# Patient Record
Sex: Female | Born: 1940 | Race: White | Hispanic: No | Marital: Single | Smoking: Former smoker
Health system: Southern US, Community
[De-identification: ages and names within clinical notes are randomized; demographics above are authoritative.]

## PROBLEM LIST (undated history)

## (undated) DIAGNOSIS — J449 Chronic obstructive pulmonary disease, unspecified: Secondary | ICD-10-CM

## (undated) DIAGNOSIS — C3490 Malignant neoplasm of unspecified part of unspecified bronchus or lung: Secondary | ICD-10-CM

## (undated) DIAGNOSIS — F32A Depression, unspecified: Secondary | ICD-10-CM

## (undated) DIAGNOSIS — E785 Hyperlipidemia, unspecified: Secondary | ICD-10-CM

## (undated) DIAGNOSIS — I639 Cerebral infarction, unspecified: Secondary | ICD-10-CM

## (undated) DIAGNOSIS — R519 Headache, unspecified: Secondary | ICD-10-CM

## (undated) DIAGNOSIS — J45909 Unspecified asthma, uncomplicated: Secondary | ICD-10-CM

## (undated) DIAGNOSIS — F419 Anxiety disorder, unspecified: Secondary | ICD-10-CM

## (undated) DIAGNOSIS — R51 Headache: Secondary | ICD-10-CM

## (undated) DIAGNOSIS — R32 Unspecified urinary incontinence: Secondary | ICD-10-CM

## (undated) DIAGNOSIS — F329 Major depressive disorder, single episode, unspecified: Secondary | ICD-10-CM

## (undated) DIAGNOSIS — E119 Type 2 diabetes mellitus without complications: Secondary | ICD-10-CM

## (undated) DIAGNOSIS — I10 Essential (primary) hypertension: Secondary | ICD-10-CM

## (undated) DIAGNOSIS — IMO0001 Reserved for inherently not codable concepts without codable children: Secondary | ICD-10-CM

## (undated) HISTORY — DX: Major depressive disorder, single episode, unspecified: F32.9

## (undated) HISTORY — DX: Hyperlipidemia, unspecified: E78.5

## (undated) HISTORY — DX: Malignant neoplasm of unspecified part of unspecified bronchus or lung: C34.90

## (undated) HISTORY — DX: Chronic obstructive pulmonary disease, unspecified: J44.9

## (undated) HISTORY — PX: COLONOSCOPY: SHX174

## (undated) HISTORY — DX: Type 2 diabetes mellitus without complications: E11.9

## (undated) HISTORY — DX: Anxiety disorder, unspecified: F41.9

## (undated) HISTORY — PX: BREAST SURGERY: SHX581

## (undated) HISTORY — DX: Essential (primary) hypertension: I10

## (undated) HISTORY — DX: Unspecified urinary incontinence: R32

## (undated) HISTORY — DX: Cerebral infarction, unspecified: I63.9

## (undated) HISTORY — DX: Hypomagnesemia: E83.42

## (undated) HISTORY — PX: CARDIAC CATHETERIZATION: SHX172

## (undated) HISTORY — DX: Depression, unspecified: F32.A

---

## 2004-07-10 ENCOUNTER — Emergency Department: Payer: Self-pay | Admitting: Unknown Physician Specialty

## 2006-12-24 ENCOUNTER — Other Ambulatory Visit: Payer: Self-pay

## 2006-12-24 ENCOUNTER — Inpatient Hospital Stay: Payer: Self-pay | Admitting: Internal Medicine

## 2008-03-03 ENCOUNTER — Ambulatory Visit: Payer: Self-pay | Admitting: Gastroenterology

## 2008-03-25 ENCOUNTER — Emergency Department: Payer: Self-pay | Admitting: Emergency Medicine

## 2009-04-12 ENCOUNTER — Ambulatory Visit: Payer: Self-pay | Admitting: Cardiovascular Disease

## 2012-12-23 ENCOUNTER — Ambulatory Visit: Payer: Self-pay | Admitting: Family Medicine

## 2012-12-31 ENCOUNTER — Ambulatory Visit: Payer: Self-pay | Admitting: Family Medicine

## 2013-11-04 ENCOUNTER — Ambulatory Visit: Payer: Self-pay | Admitting: Family Medicine

## 2013-12-30 ENCOUNTER — Ambulatory Visit: Payer: Self-pay | Admitting: Family Medicine

## 2013-12-30 LAB — RAPID STREP-A WITH REFLX: MICRO TEXT REPORT: NEGATIVE

## 2014-01-01 LAB — BETA STREP CULTURE(ARMC)

## 2014-02-26 ENCOUNTER — Institutional Professional Consult (permissible substitution): Payer: Self-pay | Admitting: Internal Medicine

## 2014-03-02 ENCOUNTER — Institutional Professional Consult (permissible substitution): Payer: Self-pay | Admitting: Internal Medicine

## 2014-03-30 ENCOUNTER — Ambulatory Visit: Payer: Self-pay | Admitting: Gastroenterology

## 2014-04-26 LAB — SURGICAL PATHOLOGY

## 2014-06-15 ENCOUNTER — Encounter: Payer: Self-pay | Admitting: Family Medicine

## 2014-06-15 ENCOUNTER — Ambulatory Visit (INDEPENDENT_AMBULATORY_CARE_PROVIDER_SITE_OTHER): Payer: PPO | Admitting: Family Medicine

## 2014-06-15 VITALS — BP 101/66 | HR 94 | Temp 98.3°F | Wt 274.0 lb

## 2014-06-15 DIAGNOSIS — I10 Essential (primary) hypertension: Secondary | ICD-10-CM

## 2014-06-15 DIAGNOSIS — E785 Hyperlipidemia, unspecified: Secondary | ICD-10-CM

## 2014-06-15 DIAGNOSIS — R0901 Asphyxia: Secondary | ICD-10-CM

## 2014-06-15 DIAGNOSIS — IMO0002 Reserved for concepts with insufficient information to code with codable children: Secondary | ICD-10-CM | POA: Insufficient documentation

## 2014-06-15 DIAGNOSIS — J441 Chronic obstructive pulmonary disease with (acute) exacerbation: Secondary | ICD-10-CM

## 2014-06-15 DIAGNOSIS — R0982 Postnasal drip: Secondary | ICD-10-CM

## 2014-06-15 DIAGNOSIS — J984 Other disorders of lung: Secondary | ICD-10-CM

## 2014-06-15 DIAGNOSIS — K760 Fatty (change of) liver, not elsewhere classified: Secondary | ICD-10-CM | POA: Insufficient documentation

## 2014-06-15 DIAGNOSIS — R911 Solitary pulmonary nodule: Secondary | ICD-10-CM | POA: Insufficient documentation

## 2014-06-15 DIAGNOSIS — E1165 Type 2 diabetes mellitus with hyperglycemia: Secondary | ICD-10-CM

## 2014-06-15 DIAGNOSIS — J301 Allergic rhinitis due to pollen: Secondary | ICD-10-CM

## 2014-06-15 MED ORDER — PREDNISONE 20 MG PO TABS: ORAL_TABLET | ORAL | Status: DC

## 2014-06-15 MED ORDER — PRAVASTATIN SODIUM 80 MG PO TABS: 80.0000 mg | ORAL_TABLET | Freq: Once | ORAL | Status: DC

## 2014-06-15 MED ORDER — FLUTICASONE PROPIONATE 50 MCG/ACT NA SUSP: 2.0000 | Freq: Every day | NASAL | Status: DC

## 2014-06-15 MED ORDER — METFORMIN HCL ER 500 MG PO TB24: 1000.0000 mg | ORAL_TABLET | Freq: Two times a day (BID) | ORAL | Status: DC

## 2014-06-15 MED ORDER — GLIMEPIRIDE 4 MG PO TABS: 4.0000 mg | ORAL_TABLET | Freq: Every day | ORAL | Status: DC

## 2014-06-15 MED ORDER — DOXYCYCLINE HYCLATE 100 MG PO TABS: 100.0000 mg | ORAL_TABLET | Freq: Two times a day (BID) | ORAL | Status: DC

## 2014-06-15 MED ORDER — BENAZEPRIL HCL 40 MG PO TABS: 40.0000 mg | ORAL_TABLET | Freq: Every day | ORAL | Status: DC

## 2014-06-15 NOTE — Assessment & Plan Note (Signed)
Continue meds, weight loss, dash guidelines

## 2014-06-15 NOTE — Progress Notes (Signed)
Patient ID: Sherry Mueller, female   DOB: 05-18-1940, 74 y.o.   MRN: [id number]   Subjective:   Sherry Mueller is a 74 y.o. female here for follow-up today  1.  COPD; started wheezing a week ago; no fevers; she sweats sometimes, but not sure if fever; short of breath; wears oxygen at home, just at night; coughing up phlegm, just white; energy is sapped; she has never been to the lung doctor; she is not interested in going to see a lung doctor at all  2.  Diabetes mellitus type 2 Patient has had diabetes since 2006 Checking sugars?  yes How often? Few times a week Range (low to high) over last two weeks:  135-150 Feels diabetes is under very good control, as good as can be Does patient feel additional teaching/training would be helpful?  no Trying to limit white bread, white rice, white potatoes, sweets?  yes   3.  Hypertension Patient has had hypertension since 2006 or so Checking blood pressure away from here?  no Hypertension-associated complications:  none If taking medicines, are you taking them regularly?  yes Siblings / family history: Does high blood pressure run in your family?   no Salt:  Trying to limit sodium / salt when buying foods at the grocery store?  yes  Do you try to limit added salt when cooking and at the table?  NO, does love her salt and adds it at the table Sweets/licorice:  Do you eat a lot of sweets or eat black licorice?  no Saturated fats: Do you eat a lot of foods like bacon, sausage, pepperoni, cheeseburgers, hot dogs, bologna, and cheese?  no Sedentary lifestyle:  Exercise/activity level:  sedentary (essential NO activity) Steroids/Non-steroidals:  Have you had a recent cortisone shot in the last few months?  no  Do you take prednisone or prescription NSAIDs NO, or take OTCS NSAIDs such as ibuprofen, Motrin, Advil, Aleve, or naproxen? no Smoking: Do you smoke?  no Sudafed (decongestants): Do you use any OTC decongestant products like Allegra-D,  Claritin-D, Zyrtec-D, Tylenol Cold and Sinus, etc.?  YES  4.  Obesity; she is working on weight loss, cutting down what she eats; she has lost 6 pounds since last visit; not worrisome, all intentional  5.  High cholesterol; she is taking statin without any problems; she is working on weight loss as noted   Past Medical History  Diagnosis Date  . Anxiety   . Depression   . COPD (chronic obstructive pulmonary disease)   . Stroke   . Hyperlipidemia   . Hypertension   . Diabetes mellitus without complication   . CHF (congestive heart failure)    Past Surgical History  Procedure Laterality Date  . Breast surgery Left cyst removed  . Cardiac catheterization     Family History  Problem Relation Age of Onset  . Heart disease Mother   . Hypertension Mother   . Heart disease Father   . Stroke Father    History  Substance Use Topics  . Smoking status: Former Smoker    Quit date: 06/15/2007  . Smokeless tobacco: Never Used  . Alcohol Use: No     Allergies  Allergen Reactions  . Byetta 10 Mcg Pen [Exenatide] Nausea Only  . Hctz [Hydrochlorothiazide] Nausea Only  . Penicillins Itching  . Plavix [Clopidogrel] Nausea Only   ------------- Review of Systems  Constitutional: Positive for fever (sweating, not sure if fever) and fatigue. Negative for unexpected weight change.  HENT: Positive for postnasal drip and rhinorrhea.   Respiratory: Positive for cough, shortness of breath and wheezing.   Gastrointestinal: Negative for abdominal pain and blood in stool.  Endocrine: Positive for polydipsia and polyuria.  Allergic/Immunologic: Positive for environmental allergies.  Neurological: Positive for tremors. Negative for speech difficulty.  Hematological: Does not bruise/bleed easily.  Psychiatric/Behavioral: Negative for confusion and decreased concentration.    Objective:   Filed Vitals:   06/15/14 0810  BP: 101/66  Pulse: 94  Temp: 98.3 F (36.8 C)  Weight: 274 lb  (124.286 kg)  SpO2: 89%   Body mass index is 42.9 kg/(m^2). Wt Readings from Last 3 Encounters:  06/15/14 274 lb (124.286 kg)  03/15/14 280 lb (127.007 kg)   '@PHYSEXAMBYAGE'$ @ Physical Exam  Constitutional: She appears well-developed and well-nourished. No distress.  Obese, weight loss noted  HENT:  Head: Normocephalic and atraumatic.  Neck: Normal range of motion. No thyromegaly present.  Cardiovascular: Normal rate, regular rhythm and normal heart sounds.   No murmur heard. Respiratory: Effort normal. No respiratory distress. She has wheezes.  Frequent cough; decreased breath sounds left upper lung field; no focal rhonchi; diffuse mild expiratory wheezing  GI: Soft. Bowel sounds are normal. She exhibits no distension. There is no tenderness.  Musculoskeletal: She exhibits no edema.  Lymphadenopathy:    She has no cervical adenopathy.  Neurological: She is alert. She displays tremor. She displays no atrophy. No sensory deficit. She exhibits normal muscle tone.  Skin: Skin is warm and dry. No rash noted. No pallor.  Psychiatric: She has a normal mood and affect. Her behavior is normal. Judgment and thought content normal.   Diabetic Foot Form - Detailed   Diabetic Foot Exam - detailed  Diabetic Foot exam was performed with the following findings:  Yes 06/15/2014 11:48 AM  Visual Foot Exam completed.:  Yes  Are the toenails ingrown?:  No  Normal Range of Motion:  No    Pulse Foot Exam completed.:  Yes  Right Dorsalis Pedis:  Present Left Dorsalis Pedis:  Present  Sensory Foot Exam Completed.:  Yes  Semmes-Weinstein Monofilament Test  R Site 1-Great Toe:  Pos L Site 1-Great Toe:  Pos       Assessment/Plan:   Problem List Items Addressed This Visit      Cardiovascular and Mediastinum   Benign hypertension    Continue meds, weight loss, dash guidelines      Relevant Medications   amLODipine (NORVASC) 5 MG tablet   isosorbide mononitrate (IMDUR) 30 MG 24 hr tablet    benazepril (LOTENSIN) 40 MG tablet   pravastatin (PRAVACHOL) 80 MG tablet     Respiratory   Restrictive lung disease    She does not want to see pulmonologist; encouraged her to continue weight loss      Allergic rhinitis due to pollen    Start nasal corticosteroid        Digestive   Fatty liver    Check LFTs, praised patient for purposeful weight loss      Relevant Orders   Comprehensive metabolic panel     Other   Pulmonary nodule    Chest CT ordered; discussed with patient      Relevant Orders   CT Chest Wo Contrast   Diabetes mellitus type 2, uncontrolled    Last A1C was 7.8 in February; ordered today; last urine microalbumin in October 2015 was normal, next due October 29, 2014 or just after; continue modest weight loss, healthy eating; no  need to check FSBS reguarly per [employer] Best Practices; foot exam by MD today      Relevant Medications   metFORMIN (GLUCOPHAGE-XR) 500 MG 24 hr tablet   benazepril (LOTENSIN) 40 MG tablet   glimepiride (AMARYL) 4 MG tablet   pravastatin (PRAVACHOL) 80 MG tablet   Other Relevant Orders   Hgb A1c w/o eAG   Lipid Panel w/o Chol/HDL Ratio   Hyperlipidemia    On statin, continue, check lipids and sgpt today      Relevant Medications   amLODipine (NORVASC) 5 MG tablet   isosorbide mononitrate (IMDUR) 30 MG 24 hr tablet   benazepril (LOTENSIN) 40 MG tablet   pravastatin (PRAVACHOL) 80 MG tablet   Other Relevant Orders   Lipid Panel w/o Chol/HDL Ratio   Morbid obesity    Encouragement given to continue purposeful weight loss      Relevant Medications   metFORMIN (GLUCOPHAGE-XR) 500 MG 24 hr tablet   glimepiride (AMARYL) 4 MG tablet   Mild asphyxia    She does not want to see pulmonologist; continue nocturnal oxygen      Relevant Orders   CBC with Differential/Platelet    Other Visit Diagnoses    COPD with acute exacerbation    -  Primary    pt declined CXR today; declined sooner f/u appt; start steroids and antibiotics;  she says she'll call if she needs me    Relevant Medications    Ipratropium-Albuterol (COMBIVENT RESPIMAT) 20-100 MCG/ACT AERS respimat    Fluticasone-Salmeterol (ADVAIR DISKUS) 500-50 MCG/DOSE AEPB    predniSONE (DELTASONE) 20 MG tablet    fluticasone (FLONASE) 50 MCG/ACT nasal spray    Other Relevant Orders    CBC with Differential/Platelet    Postnasal drip        start nasal corticosteroid        Meds ordered this encounter  Medications  . amLODipine (NORVASC) 5 MG tablet    Sig: Take 5 mg by mouth daily.  . isosorbide mononitrate (IMDUR) 30 MG 24 hr tablet    Sig: Take 30 mg by mouth daily.  . Ipratropium-Albuterol (COMBIVENT RESPIMAT) 20-100 MCG/ACT AERS respimat    Sig: Inhale 1 puff into the lungs every 6 (six) hours.  . busPIRone (BUSPAR) 10 MG tablet    Sig: Take 10 mg by mouth 2 (two) times daily as needed.  Marland Kitchen DISCONTD: metFORMIN (GLUCOPHAGE-XR) 500 MG 24 hr tablet    Sig: Take 500 mg by mouth. Take 2 tablets twice a day  . Fluticasone-Salmeterol (ADVAIR DISKUS) 500-50 MCG/DOSE AEPB    Sig: Inhale 1 puff into the lungs 2 (two) times daily.  Marland Kitchen DISCONTD: benazepril (LOTENSIN) 40 MG tablet    Sig: Take 40 mg by mouth daily.  . citalopram (CELEXA) 20 MG tablet    Sig: Take 20 mg by mouth daily.  . predniSONE (DELTASONE) 20 MG tablet    Sig: Take 3 pills daily x 2 days, then 2 pills daily x 2 days, then 1 pill daily x 2 days; take with food    Dispense:  12 tablet    Refill:  0  . doxycycline (VIBRA-TABS) 100 MG tablet    Sig: Take 1 tablet (100 mg total) by mouth 2 (two) times daily.    Dispense:  14 tablet    Refill:  0  . fluticasone (FLONASE) 50 MCG/ACT nasal spray    Sig: Place 2 sprays into both nostrils daily.    Dispense:  15.8 g  Refill:  11  . metFORMIN (GLUCOPHAGE-XR) 500 MG 24 hr tablet    Sig: Take 2 tablets (1,000 mg total) by mouth 2 (two) times daily. Take 2 tablets twice a day    Dispense:  360 tablet    Refill:  1  . benazepril (LOTENSIN) 40 MG  tablet    Sig: Take 1 tablet (40 mg total) by mouth daily.    Dispense:  90 tablet    Refill:  1  . glimepiride (AMARYL) 4 MG tablet    Sig: Take 1 tablet (4 mg total) by mouth daily with breakfast.    Dispense:  90 tablet    Refill:  1  . pravastatin (PRAVACHOL) 80 MG tablet    Sig: Take 1 tablet (80 mg total) by mouth once.    Dispense:  90 tablet    Refill:  1   Orders Placed This Encounter  Procedures  . CT Chest Wo Contrast    Order Specific Question:  Reason for Exam (SYMPTOM  OR DIAGNOSIS REQUIRED)    Answer:  lung nodule, 4 mm, follow-up    Order Specific Question:  Preferred imaging location?    Answer:  [facility]  . Hgb A1c w/o eAG  . CBC with Differential/Platelet  . Comprehensive metabolic panel    Order Specific Question:  Has the patient fasted?    Answer:  Yes  . Lipid Panel w/o Chol/HDL Ratio    Order Specific Question:  Has the patient fasted?    Answer:  Yes    Follow up plan: Return in about 3 months (around 09/15/2014), or if symptoms worsen or fail to improve, for diabetes, cholesterol, copd.   An After Visit Summary was printed and given to the patient.

## 2014-06-15 NOTE — Patient Instructions (Addendum)
Try to use PLAIN allergy medicine without the decongestant Avoid: phenylephrine, phenylpropanolamine, and pseudoephredine Let's start prednisone and antibiotics If you get worse, please do call or seek immediate medical attention at the ER or urgent care Start nasal spray for allergic rhinitis and postnasal drip We'll contact you about your labs, or you can use MyChart We'll order a chest CT Continue the great job of losing weight and watching your diet

## 2014-06-15 NOTE — Assessment & Plan Note (Signed)
Check LFTs, praised patient for purposeful weight loss

## 2014-06-15 NOTE — Assessment & Plan Note (Signed)
On statin, continue, check lipids and sgpt today

## 2014-06-15 NOTE — Assessment & Plan Note (Signed)
Chest CT ordered; discussed with patient

## 2014-06-15 NOTE — Assessment & Plan Note (Signed)
She does not want to see pulmonologist; continue nocturnal oxygen

## 2014-06-15 NOTE — Assessment & Plan Note (Signed)
She does not want to see pulmonologist; encouraged her to continue weight loss

## 2014-06-15 NOTE — Assessment & Plan Note (Signed)
Start nasal corticosteroid

## 2014-06-15 NOTE — Assessment & Plan Note (Signed)
Last A1C was 7.8 in February; ordered today; last urine microalbumin in October 2015 was normal, next due October 29, 2014 or just after; continue modest weight loss, healthy eating; no need to check FSBS reguarly per [employer] Best Practices; foot exam by MD today

## 2014-06-15 NOTE — Assessment & Plan Note (Signed)
Encouragement given to continue purposeful weight loss

## 2014-06-16 ENCOUNTER — Encounter: Payer: Self-pay | Admitting: Family Medicine

## 2014-06-16 LAB — CBC WITH DIFFERENTIAL/PLATELET
BASOS ABS: 0 10*3/uL (ref 0.0–0.2)
Basos: 0 %
EOS (ABSOLUTE): 0.1 10*3/uL (ref 0.0–0.4)
Eos: 1 %
Hematocrit: 39.9 % (ref 34.0–46.6)
Hemoglobin: 13.1 g/dL (ref 11.1–15.9)
IMMATURE GRANULOCYTES: 0 %
Immature Grans (Abs): 0 10*3/uL (ref 0.0–0.1)
LYMPHS ABS: 1.8 10*3/uL (ref 0.7–3.1)
Lymphs: 20 %
MCH: 28.1 pg (ref 26.6–33.0)
MCHC: 32.8 g/dL (ref 31.5–35.7)
MCV: 86 fL (ref 79–97)
MONOCYTES: 6 %
Monocytes Absolute: 0.5 10*3/uL (ref 0.1–0.9)
Neutrophils Absolute: 6.5 10*3/uL (ref 1.4–7.0)
Neutrophils: 73 %
PLATELETS: 261 10*3/uL (ref 150–379)
RBC: 4.66 x10E6/uL (ref 3.77–5.28)
RDW: 14.2 % (ref 12.3–15.4)
WBC: 9 10*3/uL (ref 3.4–10.8)

## 2014-06-16 LAB — LIPID PANEL W/O CHOL/HDL RATIO
Cholesterol, Total: 151 mg/dL (ref 100–199)
HDL: 51 mg/dL (ref >39)
LDL Calculated: 77 mg/dL (ref 0–99)
TRIGLYCERIDES: 115 mg/dL (ref 0–149)
VLDL Cholesterol Cal: 23 mg/dL (ref 5–40)

## 2014-06-16 LAB — COMPREHENSIVE METABOLIC PANEL
A/G RATIO: 2.1 (ref 1.1–2.5)
ALT: 14 IU/L (ref 0–32)
AST: 15 IU/L (ref 0–40)
Albumin: 4.2 g/dL (ref 3.5–4.8)
Alkaline Phosphatase: 81 IU/L (ref 39–117)
BUN/Creatinine Ratio: 21 (ref 11–26)
BUN: 13 mg/dL (ref 8–27)
Bilirubin Total: 0.6 mg/dL (ref 0.0–1.2)
CO2: 25 mmol/L (ref 18–29)
CREATININE: 0.61 mg/dL (ref 0.57–1.00)
Calcium: 9.4 mg/dL (ref 8.7–10.3)
Chloride: 96 mmol/L — ABNORMAL LOW (ref 97–108)
GFR calc Af Amer: 105 mL/min/{1.73_m2} (ref >59)
GFR calc non Af Amer: 92 mL/min/{1.73_m2} (ref >59)
Globulin, Total: 2 g/dL (ref 1.5–4.5)
Glucose: 150 mg/dL — ABNORMAL HIGH (ref 65–99)
Potassium: 4.8 mmol/L (ref 3.5–5.2)
SODIUM: 140 mmol/L (ref 134–144)
Total Protein: 6.2 g/dL (ref 6.0–8.5)

## 2014-06-16 LAB — HGB A1C W/O EAG: HEMOGLOBIN A1C: 7.5 % — AB (ref 4.8–5.6)

## 2014-06-18 ENCOUNTER — Ambulatory Visit (INDEPENDENT_AMBULATORY_CARE_PROVIDER_SITE_OTHER): Payer: PPO | Admitting: Family Medicine

## 2014-06-18 ENCOUNTER — Encounter: Payer: Self-pay | Admitting: Family Medicine

## 2014-06-18 ENCOUNTER — Ambulatory Visit
Admission: RE | Admit: 2014-06-18 | Discharge: 2014-06-18 | Disposition: A | Discharge status: Home / Self Care | Payer: PPO | Source: Ambulatory Visit | Attending: Family Medicine | Admitting: Family Medicine

## 2014-06-18 ENCOUNTER — Telehealth: Payer: Self-pay

## 2014-06-18 VITALS — BP 136/75 | HR 86 | Temp 98.5°F | Wt 277.0 lb

## 2014-06-18 DIAGNOSIS — R0602 Shortness of breath: Secondary | ICD-10-CM | POA: Diagnosis present

## 2014-06-18 DIAGNOSIS — R05 Cough: Secondary | ICD-10-CM | POA: Diagnosis not present

## 2014-06-18 DIAGNOSIS — R918 Other nonspecific abnormal finding of lung field: Secondary | ICD-10-CM

## 2014-06-18 DIAGNOSIS — Z87891 Personal history of nicotine dependence: Secondary | ICD-10-CM | POA: Diagnosis not present

## 2014-06-18 DIAGNOSIS — I509 Heart failure, unspecified: Secondary | ICD-10-CM | POA: Insufficient documentation

## 2014-06-18 DIAGNOSIS — R0989 Other specified symptoms and signs involving the circulatory and respiratory systems: Secondary | ICD-10-CM | POA: Insufficient documentation

## 2014-06-18 DIAGNOSIS — E119 Type 2 diabetes mellitus without complications: Secondary | ICD-10-CM | POA: Insufficient documentation

## 2014-06-18 DIAGNOSIS — J449 Chronic obstructive pulmonary disease, unspecified: Secondary | ICD-10-CM | POA: Insufficient documentation

## 2014-06-18 DIAGNOSIS — R0901 Asphyxia: Secondary | ICD-10-CM

## 2014-06-18 DIAGNOSIS — E1165 Type 2 diabetes mellitus with hyperglycemia: Secondary | ICD-10-CM

## 2014-06-18 DIAGNOSIS — I10 Essential (primary) hypertension: Secondary | ICD-10-CM | POA: Diagnosis not present

## 2014-06-18 DIAGNOSIS — IMO0002 Reserved for concepts with insufficient information to code with codable children: Secondary | ICD-10-CM

## 2014-06-18 DIAGNOSIS — J189 Pneumonia, unspecified organism: Secondary | ICD-10-CM

## 2014-06-18 MED ORDER — LEVOFLOXACIN 500 MG PO TABS: 500.0000 mg | ORAL_TABLET | Freq: Every day | ORAL | Status: DC

## 2014-06-18 NOTE — Telephone Encounter (Signed)
Patient called back, appointment scheduled for this afternoon at 2:30pm.

## 2014-06-18 NOTE — Telephone Encounter (Signed)
Left message for patient to call.

## 2014-06-18 NOTE — Telephone Encounter (Signed)
Left message for Sherry Mueller, oncology nurse navigator, to call. Dr. Sanda Klein would like to speak with him.

## 2014-06-18 NOTE — Patient Instructions (Signed)
Do use the oxygen around-the-clock if tolerated Stop the doxycycline Start the levafloxacin Cut your citalopram in half, and only take 10 mg daily for the next two weeks Stop the metformin for the next two weeks We'll have you meet with Burgess Estelle at the hematology-oncology clinic next week and he'll navigate your appointments with the specialists there Call us at any time if you have questions Go to the emergency department if worse

## 2014-06-18 NOTE — Telephone Encounter (Signed)
-----   Message from Arnetha Courser, MD sent at 06/18/2014 11:28 AM EDT ----- Patient needs an appointment with me this afternoon please

## 2014-06-19 NOTE — Progress Notes (Signed)
BP 136/75 mmHg  Pulse 86  Temp(Src) 98.5 F (36.9 C)  Wt 277 lb (125.646 kg)  SpO2 88%   Subjective:    Patient ID: Sherry Mueller, female    DOB: [birth year], 74 y.o.   MRN: [id number]  HPI: Sherry Mueller is a 74 y.o. female  Chief Complaint  Patient presents with  . Results    discuss Chest CT results   She is here with her son She had a CT scan of the chest done and is here to review those results today Surprisingly, she has already reviewed the report on MyChart I am new to Epic and had not even signed off of the CT scan, so I'm not sure how she was able to access the report before I was able to give her the news that there is a large spiculated mass worrisome for primary malignancy We reviewed the report together She continues to cough; she was started on doxycycline and may be feeling just a little better, but not significantly She did not bring her oxygen and did not want any put on her while she was here, when I pointed out her low pulse ox; she says the oxygen irritates her She quit smoking about 8 years ago She is wheezing less on the prednisone  Relevant past medical, surgical, family and social history reviewed and updated as indicated. Interim medical history since our last visit reviewed. Allergies and medications reviewed and updated.  Review of Systems  Constitutional: Negative for fever and unexpected weight change.  Respiratory: Positive for cough and shortness of breath.     Per HPI unless specifically indicated above     Objective:    BP 136/75 mmHg  Pulse 86  Temp(Src) 98.5 F (36.9 C)  Wt 277 lb (125.646 kg)  SpO2 88%  Wt Readings from Last 3 Encounters:  06/18/14 277 lb (125.646 kg)  06/15/14 274 lb (124.286 kg)  03/15/14 280 lb (127.007 kg)    Physical Exam  Constitutional: She appears well-nourished. No distress.  HENT:  Head: Normocephalic and atraumatic.  Eyes: No scleral icterus.  Cardiovascular: Normal rate and regular rhythm.    Pulmonary/Chest: Effort normal. No respiratory distress. She has no wheezes. She has no rales.  Abdominal: She exhibits no distension.  Lymphadenopathy:    She has no cervical adenopathy.  Skin: Skin is warm and dry. She is not diaphoretic. No pallor.  Psychiatric: She has a normal mood and affect. Her behavior is normal. Judgment and thought content normal.    Results for orders placed or performed in visit on 06/15/14  Hgb A1c w/o eAG  Result Value Ref Range   Hgb A1c MFr Bld 7.5 (H) 4.8 - 5.6 %  CBC with Differential/Platelet  Result Value Ref Range   WBC 9.0 3.4 - 10.8 x10E3/uL   RBC 4.66 3.77 - 5.28 x10E6/uL   Hemoglobin 13.1 11.1 - 15.9 g/dL   Hematocrit 39.9 34.0 - 46.6 %   MCV 86 79 - 97 fL   MCH 28.1 26.6 - 33.0 pg   MCHC 32.8 31.5 - 35.7 g/dL   RDW 14.2 12.3 - 15.4 %   Platelets 261 150 - 379 x10E3/uL   NEUTROPHILS 73 %   Lymphs 20 %   Monocytes 6 %   Eos 1 %   Basos 0 %   Neutrophils Absolute 6.5 1.4 - 7.0 x10E3/uL   Lymphocytes Absolute 1.8 0.7 - 3.1 x10E3/uL   Monocytes Absolute 0.5 0.1 - 0.9  x10E3/uL   EOS (ABSOLUTE) 0.1 0.0 - 0.4 x10E3/uL   Basophils Absolute 0.0 0.0 - 0.2 x10E3/uL   Immature Granulocytes 0 %   Immature Grans (Abs) 0.0 0.0 - 0.1 x10E3/uL  Comprehensive metabolic panel  Result Value Ref Range   Glucose 150 (H) 65 - 99 mg/dL   BUN 13 8 - 27 mg/dL   Creatinine, Ser 0.61 0.57 - 1.00 mg/dL   GFR calc non Af Amer 92 >59 mL/min/1.73   GFR calc Af Amer 105 >59 mL/min/1.73   BUN/Creatinine Ratio 21 11 - 26   Sodium 140 134 - 144 mmol/L   Potassium 4.8 3.5 - 5.2 mmol/L   Chloride 96 (L) 97 - 108 mmol/L   CO2 25 18 - 29 mmol/L   Calcium 9.4 8.7 - 10.3 mg/dL   Total Protein 6.2 6.0 - 8.5 g/dL   Albumin 4.2 3.5 - 4.8 g/dL   Globulin, Total 2.0 1.5 - 4.5 g/dL   Albumin/Globulin Ratio 2.1 1.1 - 2.5   Bilirubin Total 0.6 0.0 - 1.2 mg/dL   Alkaline Phosphatase 81 39 - 117 IU/L   AST 15 0 - 40 IU/L   ALT 14 0 - 32 IU/L  Lipid Panel w/o Chol/HDL  Ratio  Result Value Ref Range   Cholesterol, Total 151 100 - 199 mg/dL   Triglycerides 115 0 - 149 mg/dL   HDL 51 >39 mg/dL   VLDL Cholesterol Cal 23 5 - 40 mg/dL   LDL Calculated 77 0 - 99 mg/dL      Assessment & Plan:   Problem List Items Addressed This Visit      Other   Diabetes mellitus type 2, uncontrolled    Stop metformin for now; may be getting additional imaging with contrast, potential for dehydration with illness, pneumonia; will tolerate higher sugars for the short-term      Mild asphyxia    Hypoxic in office; she declined supplemental O2; I suggested she used ATC O2 at home       Other Visit Diagnoses    Lung mass    -  Primary    chest CT scan June 2016; discussed, concerning for malignancy, though infectious, inflammatory process possible; appt with oncologist Tuesday    Pneumonia, organism unspecified        with hypoxia; switch from doxy to levaquin; go to ER if worsening over weekend    Relevant Medications    levofloxacin (LEVAQUIN) 500 MG tablet      I spoke with patient navigator, Shawn, at the cancer center prior to her appt and after her appt; he graciously agreed to see the patient after I saw her today, but patient saw the results before she came in and was okay with waiting until next week to see someone; patient will see oncologist and navigator on Tuesday  Follow up plan: Return in about 3 weeks (around 07/09/2014) for lung problems.

## 2014-06-19 NOTE — Assessment & Plan Note (Signed)
Stop metformin for now; may be getting additional imaging with contrast, potential for dehydration with illness, pneumonia; will tolerate higher sugars for the short-term

## 2014-06-19 NOTE — Assessment & Plan Note (Signed)
Hypoxic in office; she declined supplemental O2; I suggested she used ATC O2 at home

## 2014-06-21 ENCOUNTER — Telehealth: Payer: Self-pay | Admitting: *Deleted

## 2014-06-21 NOTE — Telephone Encounter (Signed)
Oncology Nurse Navigator Documentation  Oncology Nurse Navigator Flowsheets 06/21/2014  Referral date to RadOnc/MedOnc 06/18/2014  Navigator Encounter Type Telephone  Treatment Phase Abnormal Scans  Time Spent with Patient 15   Discussed findings and plan for care with Dr. Marzetta Board on 06/18/14. Discussed and confirmed medical oncologist appt as well as advising of holding ASA, (reviewed with Dr. Marzetta Board), and avoiding other anticoagulant medications. Patient verbalizes and is in agreement with plan. Will tentatively plan for EBUS later this week.

## 2014-06-22 ENCOUNTER — Ambulatory Visit: Payer: PPO | Admitting: Oncology

## 2014-06-22 ENCOUNTER — Other Ambulatory Visit: Payer: Self-pay | Admitting: Family Medicine

## 2014-06-22 ENCOUNTER — Telehealth: Payer: Self-pay | Admitting: *Deleted

## 2014-06-22 ENCOUNTER — Inpatient Hospital Stay: Payer: PPO | Admitting: Oncology

## 2014-06-22 NOTE — Telephone Encounter (Signed)
Oncology Nurse Navigator Documentation  Oncology Nurse Navigator Flowsheets 06/21/2014 06/22/2014  Referral date to RadOnc/MedOnc 06/18/2014 -  Navigator Encounter Type Telephone Telephone  Treatment Phase Abnormal Scans Abnormal Scans  Time Spent with Patient 15 15   Patient came for medical oncologist appointment with Dr. Oliva Bustard but after realizing that he could not see her in [place] clinic, she left the [facility]. She has been scheduled to see Dr. Mike Gip on 06/23/14 in [place]. I had a long discussion with patient and son about CT abnormalities, needed biopsy, and possible treatment methods in effort to have patient agree to EBUS later this week. Patient can not have EBUS because of son's dental appointment. She is in agreement with seeing Dr. Mike Gip tomorrow and planning further procedures next week. I will follow and assist as needed.

## 2014-06-23 ENCOUNTER — Other Ambulatory Visit: Payer: PPO

## 2014-06-23 ENCOUNTER — Inpatient Hospital Stay: Payer: PPO | Attending: Hematology and Oncology | Admitting: Hematology and Oncology

## 2014-06-23 ENCOUNTER — Encounter: Payer: Self-pay | Admitting: Hematology and Oncology

## 2014-06-23 VITALS — BP 112/57 | HR 82 | Temp 98.0°F | Ht 66.73 in | Wt 273.1 lb

## 2014-06-23 DIAGNOSIS — Z8673 Personal history of transient ischemic attack (TIA), and cerebral infarction without residual deficits: Secondary | ICD-10-CM | POA: Insufficient documentation

## 2014-06-23 DIAGNOSIS — E119 Type 2 diabetes mellitus without complications: Secondary | ICD-10-CM | POA: Insufficient documentation

## 2014-06-23 DIAGNOSIS — E785 Hyperlipidemia, unspecified: Secondary | ICD-10-CM | POA: Insufficient documentation

## 2014-06-23 DIAGNOSIS — F419 Anxiety disorder, unspecified: Secondary | ICD-10-CM | POA: Insufficient documentation

## 2014-06-23 DIAGNOSIS — R918 Other nonspecific abnormal finding of lung field: Secondary | ICD-10-CM | POA: Diagnosis not present

## 2014-06-23 DIAGNOSIS — Z87891 Personal history of nicotine dependence: Secondary | ICD-10-CM | POA: Diagnosis not present

## 2014-06-23 DIAGNOSIS — Z7982 Long term (current) use of aspirin: Secondary | ICD-10-CM | POA: Insufficient documentation

## 2014-06-23 DIAGNOSIS — F329 Major depressive disorder, single episode, unspecified: Secondary | ICD-10-CM | POA: Diagnosis not present

## 2014-06-23 DIAGNOSIS — R0602 Shortness of breath: Secondary | ICD-10-CM | POA: Insufficient documentation

## 2014-06-23 DIAGNOSIS — J449 Chronic obstructive pulmonary disease, unspecified: Secondary | ICD-10-CM | POA: Diagnosis not present

## 2014-06-23 DIAGNOSIS — I5040 Unspecified combined systolic (congestive) and diastolic (congestive) heart failure: Secondary | ICD-10-CM | POA: Insufficient documentation

## 2014-06-23 DIAGNOSIS — R05 Cough: Secondary | ICD-10-CM | POA: Diagnosis not present

## 2014-06-23 DIAGNOSIS — Z79899 Other long term (current) drug therapy: Secondary | ICD-10-CM

## 2014-06-23 NOTE — Progress Notes (Signed)
Pt had a small nodule found on scanning 12-15 months ago. Recent re-check found a mass in R lung. Son has noticed an increase in cough, sputum production and dyspnea in the last 6 months. Recently diagnosed with pneumonia as well. Currently on Levaquin and prednisone  for that. Appetite is wonderful. Pt has lost 20lbs over last 6 months intentionally. Denies any pain or neuropathy anywhere. Hx COPD. Wears oxygen at nighttime only  via  at 2liters.  Bowels and bladder regular.

## 2014-06-23 NOTE — Progress Notes (Signed)
[facility] day:  06/23/2014  Chief Complaint: Sherry Mueller is a 74 y.o. female who is referred in consultation by Dr Enid Derry for a lung mass.  HPI: The patient began smoking at age 28.  She smoked 2-3 packs a day until approximately 8 years ago.  Because of her smoking history, her primary care physician began surveillance yearly chest CT scans. She states that her last chest CT was approximately one year and 3 months ago. Imaging revealed a nodule on the left lung.  Only available imaging is a CXR from 12/30/2013 which revealed no mass but emphysematous changes.  She states that when she went in for a checkup.  It was felt time for a chest CT. She denied any respiratory symptoms. Chest CT on 06/18/2014 revealed 5.0 x 3.8 x 4.7 cm right retrohilar mass in the superior segment of the right lower lobe. Mass was spiculated neoplasm likely representing a primary pulmonary neoplasm.  The mass was contiguous with and indents/narrows the bronchus intermedius. There was mediastinal and suspected right hilar adenopathy measuring between 1.1 and 1.9 cm. There were a few tiny nonspecific pulmonary nodules for which metastasis could not be excluded. There was an infiltrate within the superior segment of the left lower lobe suggesting pneumonia.  She was started on doxycycline and prednisone. She states that he was then switched to Levaquin secondary to hypoxia. She has 5 days left.  She has been on oxygen 2 L/min via nasal cannula at night for the past 3-4 months. CBC and CMP were normal on 06/15/2014.  Symptomatically , she notes chronic shortness of breath all the time. Cough is productive of clear mucus. She denies any hemoptysis. She denies any bone pain.  She states that she has lost 20 pounds intentionally on a diet. She eats chicken and vegetables and has stayed away from carbs for the past 4 months. Her appetite is good.  Past Medical History  Diagnosis  Date  . Anxiety   . Depression   . COPD (chronic obstructive pulmonary disease)   . Stroke   . Hyperlipidemia   . Hypertension   . Diabetes mellitus without complication   . CHF (congestive heart failure)     Past Surgical History  Procedure Laterality Date  . Breast surgery Left cyst removed  . Cardiac catheterization      Family History  Problem Relation Age of Onset  . Heart disease Mother   . Hypertension Mother   . Heart disease Father   . Stroke Father     Social History:  reports that she quit smoking about 7 years ago. She has never used smokeless tobacco. She reports that she does not drink alcohol or use illicit drugs.  The patient is accompanied by her son today.  Allergies:  Allergies  Allergen Reactions  . Byetta 10 Mcg Pen [Exenatide] Nausea Only  . Hctz [Hydrochlorothiazide] Nausea Only  . Penicillins Itching  . Plavix [Clopidogrel] Nausea Only    Current Medications: Current Outpatient Prescriptions  Medication Sig Dispense Refill  . amLODipine (NORVASC) 5 MG tablet Take 5 mg by mouth daily.    . benazepril (LOTENSIN) 40 MG tablet Take 1 tablet (40 mg total) by mouth daily. 90 tablet 1  . cholecalciferol (VITAMIN D) 1000 UNITS tablet Take 1,000 Units by mouth daily.    Marland Kitchen co-enzyme Q-10 30 MG capsule Take 30 mg by mouth daily.    . Fluticasone-Salmeterol (ADVAIR DISKUS) 500-50 MCG/DOSE  AEPB Inhale 1 puff into the lungs 2 (two) times daily.    Marland Kitchen glimepiride (AMARYL) 4 MG tablet Take 1 tablet (4 mg total) by mouth daily with breakfast. 90 tablet 1  . glucose monitoring kit (FREESTYLE) monitoring kit 1 each by Does not apply route as needed for other.    . Ipratropium-Albuterol (COMBIVENT RESPIMAT) 20-100 MCG/ACT AERS respimat Inhale 1 puff into the lungs every 6 (six) hours.    . isosorbide mononitrate (IMDUR) 30 MG 24 hr tablet Take 30 mg by mouth daily.    Marland Kitchen levofloxacin (LEVAQUIN) 500 MG tablet Take 1 tablet (500 mg total) by mouth daily. 10 tablet 0   . Multiple Vitamins-Minerals (MULTIVITAMIN WITH MINERALS) tablet Take 1 tablet by mouth daily.    . Omega-3 Fatty Acids (FISH OIL) 1000 MG CAPS Take 1,000 capsules by mouth once.    . pravastatin (PRAVACHOL) 80 MG tablet Take 1 tablet (80 mg total) by mouth once. 90 tablet 1  . predniSONE (DELTASONE) 20 MG tablet Take 3 pills daily x 2 days, then 2 pills daily x 2 days, then 1 pill daily x 2 days; take with food 12 tablet 0  . aspirin 81 MG tablet Take 81 mg by mouth 2 (two) times daily.     . busPIRone (BUSPAR) 10 MG tablet Take 10 mg by mouth 2 (two) times daily as needed.    . citalopram (CELEXA) 20 MG tablet Take 20 mg by mouth daily.    . fluticasone (FLONASE) 50 MCG/ACT nasal spray Place 2 sprays into both nostrils daily. (Patient not taking: Reported on 06/23/2014) 15.8 g 11  . metFORMIN (GLUCOPHAGE-XR) 500 MG 24 hr tablet Take 2 tablets (1,000 mg total) by mouth 2 (two) times daily. Take 2 tablets twice a day (Patient not taking: Reported on 06/23/2014) 360 tablet 1   No current facility-administered medications for this visit.    Review of Systems:  GENERAL:  Feels "ok".  No fevers.  Some sweats.  20 pound weight loss in the past 4 months. PERFORMANCE STATUS (ECOG):  1 HEENT:  Runny nose secondary to allergies.  No visual changes, sore throat, mouth sores or tenderness. Lungs: Chronic shortness of breath.  Clear cough.  No hemoptysis. Cardiac:  No chest pain, palpitations, orthopnea, or PND. GI:  Good appetite.  No nausea, vomiting, diarrhea, constipation, melena or hematochezia. GU:  No urgency, frequency, dysuria, or hematuria. Musculoskeletal:  No back pain.  No joint pain.  No muscle tenderness. Extremities:  No pain or swelling. Skin:  No rashes or skin changes. Neuro:  No headache, numbness or weakness, balance or coordination issues. Endocrine:  No diabetes, thyroid issues, hot flashes or night sweats. Psych:  No mood changes, depression or anxiety. Pain:  No focal  pain. Review of systems:  All other systems reviewed and found to be negative.   Physical Exam: Blood pressure 112/57, pulse 82, temperature 98 F (36.7 C), temperature source Tympanic, height 5' 6.73" (1.695 m), weight 273 lb 2.4 oz (123.9 kg), SpO2 93 %.  GENERAL:  Well developed, well nourished, sitting comfortably in the exam room in no acute distress. MENTAL STATUS:  Alert and oriented to person, place and time. HEAD:  Short dark brown hair.  Normocephalic, atraumatic, face symmetric, no Cushingoid features. EYES:  Pupils equal round and reactive to light and accomodation.  No conjunctivitis or scleral icterus. ENT:  Oropharynx clear without lesion.  Dentures.  Tongue normal. Mucous membranes moist.  RESPIRATORY:  Clear to auscultation without  rales, wheezes or rhonchi. CARDIOVASCULAR:  Regular rate and rhythm without murmur, rub or gallop. ABDOMEN: Fully round.  Soft, non-tender, with active bowel sounds, and no hepatosplenomegaly.  No masses. SKIN:  No rashes, ulcers or lesions. EXTREMITIES: No edema, no skin discoloration or tenderness.  No palpable cords. LYMPH NODES: No palpable cervical, supraclavicular, axillary or inguinal adenopathy  NEUROLOGICAL: Alert & oriented, cranial nerves II-XII intact; motor strength 5/5 throughout; sensation intact; slight shakey finger to nose and RAM normal; slight trouble with walk heel to toe walk; bilateral patella DTR 2+; negative Rhomberg; no clonus or Babinski. PSYCH:  Appropriate.   Office Visit on 06/23/2014  Component Date Value Ref Range Status  . CEA 06/23/2014 18.3* 0.0 - 4.7 ng/mL Final   Comment: (NOTE)       Roche ECLIA methodology       Nonsmokers  <3.9                                     Smokers     <5.6 Performed At: [facility] [address] [place], [place] [id number] Lindon Romp MD RS:[id number]     Assessment:  Sherry Mueller is a 74 y.o. female with a 80-100 pack smoking history presents with a right sided  lung mass on screening chest CT.  Chest CT on 06/18/2014 revealed a 5.0 x 3.8 x 4.7 cm right retrohilar mass in the superior segment of the right lower lobe. Mass was spiculated neoplasm likely representing a primary pulmonary neoplasm.  The mass was contiguous with and indents/narrows the bronchus intermedius. There was mediastinal and suspected right hilar adenopathy measuring between 1.1 and 1.9 cm. There were a few tiny nonspecific pulmonary nodules for which metastasis could not be excluded. There was an infiltrate within the superior segment of the left lower lobe suggesting pneumonia.  Symptomatically, she has chronic shortness of breath and a clear cough. She completed a course of doxycycline and is currently on Levaquin for pneumonia documented by CT. She has been on oxygen for the past 4 months (2 liters/min). She has lost 20 pounds intentionally over the past 4 months. Neurologic exam is normal except for slight tremor and difficulty with heel to toe walk.  CBC and CMP were normal on 06/15/2014.    Plan: 1. I reviewed with patient details of the study. I discussed the likely diagnosis of primary lung cancer. I discussed both small cell and non-small cell disease.  Based on imaging studies, I suspect she has stage III disease. I discussed the plan for labs (CEA) and PET scan. I discussed referral to pulmonary medicine for bronchoscopy and biopsy.  I discussed treatment of lung cancer. I discussed the role of surgery, radiation, and chemotherapy in general terms.  I am unsure if she has resectable disease.  I discussed concurernt chemotherapy  and radiation for stage III and chemotherapy alone for metastatic disease. 2. Labs today:  CEA. 3. Referral to pulmonary medicine for bronchoscopy and biopsy. 4. Anticipate head imaging after tissue diagnosis. 5. RTC after above to discuss direction of therapy.  Lequita Asal, MD  06/23/2014, 5:47 PM

## 2014-06-24 ENCOUNTER — Ambulatory Visit: Admission: RE | Admit: 2014-06-24 | Payer: PPO | Source: Ambulatory Visit | Admitting: Internal Medicine

## 2014-06-24 ENCOUNTER — Encounter: Admission: RE | Payer: Self-pay | Source: Ambulatory Visit

## 2014-06-24 LAB — CEA: CEA: 18.3 ng/mL — ABNORMAL HIGH (ref 0.0–4.7)

## 2014-06-24 SURGERY — ENDOBRONCHIAL ULTRASOUND (EBUS)
Anesthesia: General

## 2014-06-29 ENCOUNTER — Ambulatory Visit
Admission: RE | Admit: 2014-06-29 | Discharge: 2014-06-29 | Disposition: A | Discharge status: Home / Self Care | Payer: PPO | Source: Ambulatory Visit | Attending: Hematology and Oncology | Admitting: Hematology and Oncology

## 2014-06-29 DIAGNOSIS — R918 Other nonspecific abnormal finding of lung field: Secondary | ICD-10-CM | POA: Insufficient documentation

## 2014-06-29 DIAGNOSIS — Z8249 Family history of ischemic heart disease and other diseases of the circulatory system: Secondary | ICD-10-CM | POA: Insufficient documentation

## 2014-06-29 DIAGNOSIS — Z880 Allergy status to penicillin: Secondary | ICD-10-CM | POA: Insufficient documentation

## 2014-06-29 DIAGNOSIS — I509 Heart failure, unspecified: Secondary | ICD-10-CM | POA: Diagnosis not present

## 2014-06-29 DIAGNOSIS — C779 Secondary and unspecified malignant neoplasm of lymph node, unspecified: Secondary | ICD-10-CM | POA: Diagnosis not present

## 2014-06-29 DIAGNOSIS — R590 Localized enlarged lymph nodes: Secondary | ICD-10-CM | POA: Insufficient documentation

## 2014-06-29 DIAGNOSIS — E785 Hyperlipidemia, unspecified: Secondary | ICD-10-CM | POA: Diagnosis not present

## 2014-06-29 DIAGNOSIS — E119 Type 2 diabetes mellitus without complications: Secondary | ICD-10-CM | POA: Insufficient documentation

## 2014-06-29 DIAGNOSIS — Z888 Allergy status to other drugs, medicaments and biological substances status: Secondary | ICD-10-CM | POA: Insufficient documentation

## 2014-06-29 DIAGNOSIS — I10 Essential (primary) hypertension: Secondary | ICD-10-CM | POA: Diagnosis not present

## 2014-06-29 DIAGNOSIS — Z823 Family history of stroke: Secondary | ICD-10-CM | POA: Insufficient documentation

## 2014-06-29 LAB — GLUCOSE, CAPILLARY: Glucose-Capillary: 180 mg/dL — ABNORMAL HIGH (ref 65–99)

## 2014-06-29 MED ORDER — FLUDEOXYGLUCOSE F - 18 (FDG) INJECTION
12.6800 | Freq: Once | INTRAVENOUS | Status: AC | PRN
  Administered 2014-06-29: 12.68 via INTRAVENOUS

## 2014-07-01 ENCOUNTER — Ambulatory Visit: Payer: PPO | Admitting: Internal Medicine

## 2014-07-01 ENCOUNTER — Telehealth: Payer: Self-pay | Admitting: *Deleted

## 2014-07-01 NOTE — Telephone Encounter (Signed)
  Oncology Nurse Navigator Documentation    Navigator Encounter Type: Telephone (07/01/14 1400)             Time Spent with Patient: 15 (07/01/14 1400) Dr. Mike Gip spoke with patient this morning to review PET scan results and recommend EBUS biopsy prior to next f/u appt. Attempted x 2 to contact patient with appt for EBUS and preop phone call. Voicemail left. Will continue to attempt to contact in am.

## 2014-07-02 ENCOUNTER — Telehealth: Payer: Self-pay | Admitting: *Deleted

## 2014-07-02 NOTE — Telephone Encounter (Signed)
  Oncology Nurse Navigator Documentation    Navigator Encounter Type: Telephone (07/02/14 0900)       Interventions: Coordination of Care (07/02/14 0900)     Time Spent with Patient: 15 (07/02/14 0900) Notified patient of phone pre-op appointment scheduled for 07/07/14 from 9-12. Also notified of procedure scheduled for 07/08/14, 10am arrival. [place] arrival. Reminded to be NPO after midnight, continue to avoid anticoagulant medications, and have a driver and support person to be with patient at home after the procedure. Patient verbalizes understanding and reads back appointments.

## 2014-07-06 ENCOUNTER — Telehealth: Payer: Self-pay | Admitting: Internal Medicine

## 2014-07-06 NOTE — Telephone Encounter (Signed)
We have no contact information for this patient.   FYI - Dr. Stevenson Clinch

## 2014-07-07 ENCOUNTER — Ambulatory Visit: Payer: PPO | Admitting: Hematology and Oncology

## 2014-07-07 ENCOUNTER — Encounter: Payer: Self-pay | Admitting: *Deleted

## 2014-07-07 ENCOUNTER — Telehealth: Payer: Self-pay | Admitting: Internal Medicine

## 2014-07-07 ENCOUNTER — Inpatient Hospital Stay: Admission: RE | Admit: 2014-07-07 | Payer: Self-pay | Source: Ambulatory Visit

## 2014-07-07 NOTE — Telephone Encounter (Signed)
This patient has never been seen in our office. She had an appt in Feb 2016 which she cancelled. I have new patient packet which will be faxed to Leon at [facility] pre-registration. Nothing further needed.

## 2014-07-07 NOTE — Telephone Encounter (Signed)
Called Melvin @ [facility] to find out who will be performing bronch. Dr. Stevenson Clinch will be doing EBUS @ 11 am 07/08/14. H&P should come from [place] @ [place]. Dr. Stevenson Clinch will put his orders in either this pm or early am. If orders have to be in today Tammy B will let Dr.Mungal know.

## 2014-07-07 NOTE — Telephone Encounter (Signed)
Spoke with Nira Conn at [employer] testing.  There are no orders in chart, no history and physical. Does patient need antibiotics?  There is nothing in Epic. Same day surgery is requesting history and physical as well. EBUS tomorrow.  Dr. Stevenson Clinch, are you doing the history and physical on this patient tomorrow before procedure?  Heather from preadmit wants more information on this patient.

## 2014-07-07 NOTE — Patient Instructions (Signed)
  Your procedure is scheduled on:07-08-14 Report to [facility] To find out your arrival time please call [phone] between 1PM - 3PM on 07-07-14  Remember: Instructions that are not followed completely may result in serious medical risk, up to and including death, or upon the discretion of your surgeon and anesthesiologist your surgery may need to be rescheduled.    _X___ 1. Do not eat food or drink liquids after midnight. No gum chewing or hard candies.     _X___ 2. No Alcohol for 24 hours before or after surgery.   ____ 3. Bring all medications with you on the day of surgery if instructed.    ____ 4. Notify your doctor if there is any change in your medical condition     (cold, fever, infections).     Do not wear jewelry, make-up, hairpins, clips or nail polish.  Do not wear lotions, powders, or perfumes. You may wear deodorant.  Do not shave 48 hours prior to surgery. Men may shave face and neck.  Do not bring valuables to the hospital.    [facility] is not responsible for any belongings or valuables.               Contacts, dentures or bridgework may not be worn into surgery.  Leave your suitcase in the car. After surgery it may be brought to your room.  For patients admitted to the hospital, discharge time is determined by your  treatment team.   Patients discharged the day of surgery will not be allowed to drive home.   Please read over the following fact sheets that you were given:      _X___ Take these medicines the morning of surgery with A SIP OF WATER:    1. BENAZEPRIL  2. ISORBIDE  3.   4.  5.  6.  ____ Fleet Enema (as directed)   ____ Use CHG Soap as directed  _X___ Use inhalers on the day of surgery-USE ADVAIR AND COMBIVENT  _X___ Stop metformin 2 days prior to surgery-NOW    ____ Take 1/2 of usual insulin dose the night before surgery and none on the morning of surgery.   ____ Stop Coumadin/Plavix/aspirin-PT STOPPED ASA  LAST WEEK  ____ Stop Anti-inflammatories-NO NSAIDS OR ASA PRODUCTS-TYLENOL OK   ____ Stop supplements until after surgery.    ____ Bring C-Pap to the hospital.

## 2014-07-07 NOTE — Telephone Encounter (Signed)
Patient contact info updated.

## 2014-07-08 ENCOUNTER — Ambulatory Visit
Admission: RE | Admit: 2014-07-08 | Discharge: 2014-07-08 | Disposition: A | Discharge status: Home / Self Care | Payer: PPO | Source: Ambulatory Visit | Attending: Internal Medicine | Admitting: Internal Medicine

## 2014-07-08 ENCOUNTER — Ambulatory Visit: Payer: PPO | Admitting: Certified Registered"

## 2014-07-08 ENCOUNTER — Encounter
Admission: RE | Disposition: A | Discharge status: Home / Self Care | Payer: Self-pay | Source: Ambulatory Visit | Attending: Internal Medicine

## 2014-07-08 ENCOUNTER — Encounter: Payer: Self-pay | Admitting: *Deleted

## 2014-07-08 DIAGNOSIS — J449 Chronic obstructive pulmonary disease, unspecified: Secondary | ICD-10-CM | POA: Insufficient documentation

## 2014-07-08 DIAGNOSIS — Z7982 Long term (current) use of aspirin: Secondary | ICD-10-CM | POA: Insufficient documentation

## 2014-07-08 DIAGNOSIS — Z888 Allergy status to other drugs, medicaments and biological substances status: Secondary | ICD-10-CM | POA: Diagnosis not present

## 2014-07-08 DIAGNOSIS — I509 Heart failure, unspecified: Secondary | ICD-10-CM | POA: Diagnosis not present

## 2014-07-08 DIAGNOSIS — F329 Major depressive disorder, single episode, unspecified: Secondary | ICD-10-CM | POA: Diagnosis not present

## 2014-07-08 DIAGNOSIS — R0602 Shortness of breath: Secondary | ICD-10-CM | POA: Diagnosis not present

## 2014-07-08 DIAGNOSIS — Z79899 Other long term (current) drug therapy: Secondary | ICD-10-CM | POA: Diagnosis not present

## 2014-07-08 DIAGNOSIS — Z823 Family history of stroke: Secondary | ICD-10-CM | POA: Diagnosis not present

## 2014-07-08 DIAGNOSIS — Z8249 Family history of ischemic heart disease and other diseases of the circulatory system: Secondary | ICD-10-CM | POA: Insufficient documentation

## 2014-07-08 DIAGNOSIS — R51 Headache: Secondary | ICD-10-CM | POA: Insufficient documentation

## 2014-07-08 DIAGNOSIS — Z880 Allergy status to penicillin: Secondary | ICD-10-CM | POA: Diagnosis not present

## 2014-07-08 DIAGNOSIS — J984 Other disorders of lung: Secondary | ICD-10-CM

## 2014-07-08 DIAGNOSIS — I252 Old myocardial infarction: Secondary | ICD-10-CM | POA: Diagnosis not present

## 2014-07-08 DIAGNOSIS — E785 Hyperlipidemia, unspecified: Secondary | ICD-10-CM | POA: Insufficient documentation

## 2014-07-08 DIAGNOSIS — E119 Type 2 diabetes mellitus without complications: Secondary | ICD-10-CM | POA: Diagnosis not present

## 2014-07-08 DIAGNOSIS — R918 Other nonspecific abnormal finding of lung field: Secondary | ICD-10-CM | POA: Diagnosis present

## 2014-07-08 DIAGNOSIS — Z7951 Long term (current) use of inhaled steroids: Secondary | ICD-10-CM | POA: Insufficient documentation

## 2014-07-08 DIAGNOSIS — F419 Anxiety disorder, unspecified: Secondary | ICD-10-CM | POA: Diagnosis not present

## 2014-07-08 DIAGNOSIS — C3401 Malignant neoplasm of right main bronchus: Secondary | ICD-10-CM | POA: Diagnosis not present

## 2014-07-08 DIAGNOSIS — Z8673 Personal history of transient ischemic attack (TIA), and cerebral infarction without residual deficits: Secondary | ICD-10-CM | POA: Insufficient documentation

## 2014-07-08 DIAGNOSIS — I10 Essential (primary) hypertension: Secondary | ICD-10-CM | POA: Insufficient documentation

## 2014-07-08 DIAGNOSIS — Z9981 Dependence on supplemental oxygen: Secondary | ICD-10-CM | POA: Insufficient documentation

## 2014-07-08 DIAGNOSIS — Z87891 Personal history of nicotine dependence: Secondary | ICD-10-CM | POA: Insufficient documentation

## 2014-07-08 HISTORY — DX: Headache, unspecified: R51.9

## 2014-07-08 HISTORY — DX: Reserved for inherently not codable concepts without codable children: IMO0001

## 2014-07-08 HISTORY — DX: Headache: R51

## 2014-07-08 HISTORY — PX: ENDOBRONCHIAL ULTRASOUND: SHX5096

## 2014-07-08 LAB — GLUCOSE, CAPILLARY: GLUCOSE-CAPILLARY: 131 mg/dL — AB (ref 65–99)

## 2014-07-08 SURGERY — ENDOBRONCHIAL ULTRASOUND (EBUS)
Anesthesia: General | Laterality: Right

## 2014-07-08 MED ORDER — FAMOTIDINE 20 MG PO TABS
ORAL_TABLET | ORAL | Status: AC
  Administered 2014-07-08: 20 mg via ORAL
  Filled 2014-07-08: qty 1

## 2014-07-08 MED ORDER — NEOSTIGMINE METHYLSULFATE 10 MG/10ML IV SOLN
INTRAVENOUS | Status: DC | PRN
  Administered 2014-07-08: 5 mg via INTRAVENOUS

## 2014-07-08 MED ORDER — PROPOFOL INFUSION 10 MG/ML OPTIME
INTRAVENOUS | Status: DC | PRN
  Administered 2014-07-08: 12:00:00 via INTRAVENOUS
  Administered 2014-07-08: 100 ug/kg/min via INTRAVENOUS

## 2014-07-08 MED ORDER — SODIUM CHLORIDE 0.9 % IV SOLN
Freq: Once | INTRAVENOUS | Status: AC
  Administered 2014-07-08: 50 mL/h via INTRAVENOUS

## 2014-07-08 MED ORDER — LIDOCAINE HCL (CARDIAC) 20 MG/ML IV SOLN
INTRAVENOUS | Status: DC | PRN
  Administered 2014-07-08: 50 mg via INTRAVENOUS

## 2014-07-08 MED ORDER — ROCURONIUM BROMIDE 100 MG/10ML IV SOLN
INTRAVENOUS | Status: DC | PRN
  Administered 2014-07-08: 10 mg via INTRAVENOUS
  Administered 2014-07-08: 30 mg via INTRAVENOUS

## 2014-07-08 MED ORDER — SODIUM CHLORIDE 0.9 % IV SOLN
INTRAVENOUS | Status: DC | PRN
  Administered 2014-07-08: 11:00:00 via INTRAVENOUS

## 2014-07-08 MED ORDER — OXYCODONE HCL 5 MG PO TABS: 5.0000 mg | ORAL_TABLET | Freq: Once | ORAL | Status: DC | PRN

## 2014-07-08 MED ORDER — FAMOTIDINE 20 MG PO TABS
20.0000 mg | ORAL_TABLET | Freq: Once | ORAL | Status: AC
  Administered 2014-07-08: 20 mg via ORAL

## 2014-07-08 MED ORDER — IPRATROPIUM-ALBUTEROL 0.5-2.5 (3) MG/3ML IN SOLN
RESPIRATORY_TRACT | Status: AC
  Administered 2014-07-08: 3 mL via RESPIRATORY_TRACT
  Filled 2014-07-08: qty 3

## 2014-07-08 MED ORDER — DEXAMETHASONE SODIUM PHOSPHATE 4 MG/ML IJ SOLN
INTRAMUSCULAR | Status: DC | PRN
  Administered 2014-07-08: 5 mg via INTRAVENOUS

## 2014-07-08 MED ORDER — FENTANYL CITRATE (PF) 100 MCG/2ML IJ SOLN: 25.0000 ug | INTRAMUSCULAR | Status: DC | PRN

## 2014-07-08 MED ORDER — OXYCODONE HCL 5 MG/5ML PO SOLN: 5.0000 mg | Freq: Once | ORAL | Status: DC | PRN

## 2014-07-08 MED ORDER — PROPOFOL 10 MG/ML IV BOLUS
INTRAVENOUS | Status: DC | PRN
  Administered 2014-07-08: 30 mg via INTRAVENOUS
  Administered 2014-07-08: 150 mg via INTRAVENOUS
  Administered 2014-07-08: 20 mg via INTRAVENOUS

## 2014-07-08 MED ORDER — FENTANYL CITRATE (PF) 100 MCG/2ML IJ SOLN
INTRAMUSCULAR | Status: DC | PRN
  Administered 2014-07-08: 100 ug via INTRAVENOUS

## 2014-07-08 MED ORDER — IPRATROPIUM-ALBUTEROL 0.5-2.5 (3) MG/3ML IN SOLN
3.0000 mL | Freq: Once | RESPIRATORY_TRACT | Status: DC | PRN
Start: 2014-07-08 — End: 2014-07-08

## 2014-07-08 MED ORDER — GLYCOPYRROLATE 0.2 MG/ML IJ SOLN
INTRAMUSCULAR | Status: DC | PRN
  Administered 2014-07-08: .8 mg via INTRAVENOUS

## 2014-07-08 MED ORDER — IPRATROPIUM-ALBUTEROL 0.5-2.5 (3) MG/3ML IN SOLN
3.0000 mL | RESPIRATORY_TRACT | Status: DC
  Administered 2014-07-08: 3 mL via RESPIRATORY_TRACT

## 2014-07-08 NOTE — Anesthesia Procedure Notes (Signed)
Procedure Name: Intubation Date/Time: 07/08/2014 11:24 AM Performed by: Rolla Plate Pre-anesthesia Checklist: Patient identified, Patient being monitored, Timeout performed, Emergency Drugs available and Suction available Patient Re-evaluated:Patient Re-evaluated prior to inductionOxygen Delivery Method: Circle system utilized Preoxygenation: Pre-oxygenation with 100% oxygen Intubation Type: IV induction Ventilation: Mask ventilation without difficulty Laryngoscope Size: Miller and 2 Grade View: Grade I Tube type: Oral Tube size: 8.5 mm Number of attempts: 1 Airway Equipment and Method: Stylet and Patient positioned with wedge pillow Placement Confirmation: ETT inserted through vocal cords under direct vision,  positive ETCO2 and breath sounds checked- equal and bilateral Secured at: 21 cm Tube secured with: Tape Dental Injury: Teeth and Oropharynx as per pre-operative assessment

## 2014-07-08 NOTE — Op Note (Signed)
[facility] Patient Name: Janal Haak Procedure Date: 07/08/2014 12:19 PM MRN: [id number] Account #: [id number] Date of Birth: 1940-03-30 Admit Type: Outpatient Age: 74 Room: [address] on 2nd floor Gender: Female Note Status: Finalized Attending MD: Vilinda Boehringer,  Procedure:         Bronchoscopy Indications:       Right lower lobe mass, Right hilar mass Providers:         Chrissi Crow, Lang Snow, RCP (Technician) Referring MD:       Medicines:         Sedation Required Anesthesia Staff Assistance Complications:     No immediate complications. Estimated blood loss: Minimal Procedure:         Pre-Anesthesia Assessment:                    - A History and Physical has been performed. The patient's                     medications, allergies and sensitivities have been                     reviewed.                    After obtaining informed consent, the bronchoscope was                     passed under direct vision. Throughout the procedure, the                     patient's blood pressure, pulse, and oxygen saturations                     were monitored continuously. the Bronchofibervideoscope                     Olympus [id number] was introduced through the                     mouth, via the endotracheal tube (the patient was                     intubated for the procedure) and advanced to the                     tracheobronchial tree. The procedure was accomplished                     without difficulty. The patient tolerated the procedure                     well. The total duration of the procedure was 1 hour and                     45 minutes. Findings:      The endotracheal tube is in normal position. The trachea is of normal       caliber. The carina is sharp. The tracheobronchial tree of the left lung       was examined to at least the first subsegmental level. Bronchial mucosa       and anatomy in the left lung are normal; there are no  endobronchial       lesions, and no secretions.      Right Lung Abnormalities: Erythema was found in the right mainstem  bronchus and in the superior segment of the right lower lobe (B6).       Extrinsic compression was found in the right lower lobe. The airway       lumen is about 90% occluded. The lesion was not traversed.      Brushings were obtained in the right mainstem bronchus of the lung and       sent for routine cytology. Two samples were obtained.      An endobronchial ultrasound endoscope was utilized to examine the right       hilar region (level 10R) in order to assist with guiding the biopsy       needle, better characterize the lesion, better find surrounding tissue       and better localize the lesion.      An endobronchial ultrasound endoscope was utilized to examine the right       hilar region (level 10R) in order to assist with guiding the biopsy       needle, assist with fine needle aspiration, better characterize the       lesion, better find surrounding tissue and better localize the lesion.      Endobronchial biopsies were performed in the right lower lobe of the       lung using a forceps and sent for routine cytology. Three samples were       obtained. Impression:        - The left lung was normal.                    - Erythema was present in the right mainstem bronchus and                     in the superior segment of the right lower lobe (B6).                    - Extrinsic compression was found in the right lower lobe                     secondary to a mass.                    - No specimens collected. Recommendation:    - Await biopsy, brushing and cytology results. Attending Participation:      I personally performed the entire procedure. Tomas Schamp,  07/08/2014 12:42:02 PM Number of Addenda: 0 Note Initiated On: 07/08/2014 12:19 PM      [facility]

## 2014-07-08 NOTE — Transfer of Care (Signed)
Immediate Anesthesia Transfer of Care Note  Patient: Roger Kill  Procedure(s) Performed: Procedure(s): ENDOBRONCHIAL ULTRASOUND (Right)  Patient Location: PACU  Anesthesia Type:General  Level of Consciousness: awake and alert   Airway & Oxygen Therapy: Patient Spontanous Breathing and Patient connected to face mask oxygen  Post-op Assessment: Report given to RN  Post vital signs: Reviewed  Last Vitals:  Filed Vitals:   07/08/14 1232  BP: 128/71  Pulse: 62  Temp: 37.1 C  Resp: 22    Complications: No apparent anesthesia complications

## 2014-07-08 NOTE — Discharge Instructions (Signed)
AMBULATORY SURGERY  DISCHARGE INSTRUCTIONS   1) The drugs that you were given will stay in your system until tomorrow so for the next 24 hours you should not:  A) Drive an automobile B) Make any legal decisions C) Drink any alcoholic beverage   2) You may resume regular meals tomorrow.  Today it is better to start with liquids and gradually work up to solid foods.  You may eat anything you prefer, but it is better to start with liquids, then soup and crackers, and gradually work up to solid foods.   3) Please notify your doctor immediately if you have any unusual bleeding, trouble breathing, redness and pain at the surgery site, drainage, fever, or pain not relieved by medication.    4) Additional Instructions:    Flexible Bronchoscopy, Care After Refer to this sheet in the next few weeks. These instructions provide you with information on caring for yourself after your procedure. Your health care provider may also give you more specific instructions. Your treatment has been planned according to current medical practices, but problems sometimes occur. Call your health care provider if you have any problems or questions after your procedure.  WHAT TO EXPECT AFTER THE PROCEDURE It is normal to have the following symptoms for 24-48 hours after the procedure:   Increased cough.  Low-grade fever.  Sore throat or hoarse voice.  Small streaks of blood in your thick spit (sputum) if tissue samples were taken (biopsy). HOME CARE INSTRUCTIONS   Do not eat or drink anything for 2 hours after your procedure. Your nose and throat were numbed by medicine. If you try to eat or drink before the medicine wears off, food or drink could go into your lungs or you could burn yourself. After the numbness is gone and your cough and gag reflexes have returned, you may eat soft food and drink liquids slowly.   The day after the procedure, you can go back to your normal diet.   You may resume  normal activities.   Keep all follow-up visits as directed by your health care provider. It is important to keep all your appointments, especially if tissue samples were taken for testing (biopsy). SEEK IMMEDIATE MEDICAL CARE IF:   You have increasing shortness of breath.   You become light-headed or faint.   You have chest pain.   You have any new concerning symptoms.  You cough up more than a small amount of blood.  The amount of blood you cough up increases. MAKE SURE YOU:  Understand these instructions.  Will watch your condition.  Will get help right away if you are not doing well or get worse. Document Released: 07/07/2004 Document Revised: 05/04/2013 Document Reviewed: 08/22/2012 [facility] Patient Information 2015 [place], [place]. This information is not intended to replace advice given to you by your health care provider. Make sure you discuss any questions you have with your health care provider.     Please contact your physician with any problems or Same Day Surgery at [phone], Monday through Friday 6 am to 4 pm, or [place] at [facility] number at [phone].

## 2014-07-08 NOTE — Anesthesia Postprocedure Evaluation (Signed)
  Anesthesia Post-op Note  Patient: Sherry Mueller  Procedure(s) Performed: Procedure(s): ENDOBRONCHIAL ULTRASOUND (Right)  Anesthesia type:General  Patient location: PACU  Post pain: Pain level controlled  Post assessment: Post-op Vital signs reviewed, Patient's Cardiovascular Status Stable, Respiratory Function Stable, Patent Airway and No signs of Nausea or vomiting  Post vital signs: Reviewed and stable  Last Vitals:  Filed Vitals:   07/08/14 1315  BP: 117/47  Pulse: 74  Temp: 36.3 C  Resp: 22    Level of consciousness: awake, alert  and patient cooperative  Complications: No apparent anesthesia complications

## 2014-07-08 NOTE — H&P (Signed)
H&P as performed by Dr. Mike Gip on 06/23/14.  See below Reviewed no changes  Vilinda Boehringer, MD [facility] Pager - [phone] (Please enter [phone]) [facility] [place]  06/23/2014 10:45 AM  Office Visit  MRN:  [id number]   Description: Female DOB: 07-Nov-1940  Provider: Lequita Asal, MD  Department: [facility]       Diagnoses     Lung mass - Primary    ICD-9-CM: 786.6 ICD-10-CM: R91.8       Reason for Visit     New Evaluation    Lung Mass    Reason for Visit History        Current Vitals  Most recent update: 06/23/2014 11:41 AM by Mayer Camel, RN    BP Pulse Temp(Src) Ht Wt BMI    112/57 mmHg 82 98 F (36.7 C) (Tympanic) 5' 6.73" (1.695 m) 273 lb 2.4 oz (123.9 kg) 43.13 kg/m2     SpO2              93%           Progress Notes      Mayer Camel, RN at 06/23/2014 11:44 AM     Status: Signed       Expand All Collapse All   Pt had a small nodule found on scanning 12-15 months ago. Recent re-check found a mass in R lung. Son has noticed an increase in cough, sputum production and dyspnea in the last 6 months. Recently diagnosed with pneumonia as well. Currently on Levaquin and prednisone for that. Appetite is wonderful. Pt has lost 20lbs over last 6 months intentionally. Denies any pain or neuropathy anywhere. Hx COPD. Wears oxygen at nighttime only via [place] at 2liters. Bowels and bladder regular.            Lequita Asal, MD at 06/23/2014 4:44 PM     Status: Signed       Expand All Collapse All   [facility] day: 06/23/2014  Chief Complaint: SHARMON CHERAMIE is a 74 y.o. female who is referred in consultation by Dr Enid Derry for a lung mass.  HPI: The patient began smoking at age 44. She smoked 2-3 packs a day until approximately 8 years ago. Because of her smoking history, her primary care physician began  surveillance yearly chest CT scans. She states that her last chest CT was approximately one year and 3 months ago. Imaging revealed a nodule on the left lung. Only available imaging is a CXR from 12/30/2013 which revealed no mass but emphysematous changes.  She states that when she went in for a checkup. It was felt time for a chest CT. She denied any respiratory symptoms. Chest CT on 06/18/2014 revealed 5.0 x 3.8 x 4.7 cm right retrohilar mass in the superior segment of the right lower lobe. Mass was spiculated neoplasm likely representing a primary pulmonary neoplasm. The mass was contiguous with and indents/narrows the bronchus intermedius. There was mediastinal and suspected right hilar adenopathy measuring between 1.1 and 1.9 cm. There were a few tiny nonspecific pulmonary nodules for which metastasis could not be excluded. There was an infiltrate within the superior segment of the left lower lobe suggesting pneumonia.  She was started on doxycycline and prednisone. She states that he was then switched to Levaquin secondary to hypoxia. She has 5 days left. She has been on oxygen 2 L/min via nasal cannula at night  for the past 3-4 months. CBC and CMP were normal on 06/15/2014.  Symptomatically , she notes chronic shortness of breath all the time. Cough is productive of clear mucus. She denies any hemoptysis. She denies any bone pain. She states that she has lost 20 pounds intentionally on a diet. She eats chicken and vegetables and has stayed away from carbs for the past 4 months. Her appetite is good.  Past Medical History  Diagnosis Date  . Anxiety   . Depression   . COPD (chronic obstructive pulmonary disease)   . Stroke   . Hyperlipidemia   . Hypertension   . Diabetes mellitus without complication   . CHF (congestive heart failure)     Past Surgical History  Procedure Laterality Date  . Breast surgery Left cyst removed  . Cardiac  catheterization      Family History  Problem Relation Age of Onset  . Heart disease Mother   . Hypertension Mother   . Heart disease Father   . Stroke Father     Social History:  reports that she quit smoking about 7 years ago. She has never used smokeless tobacco. She reports that she does not drink alcohol or use illicit drugs. The patient is accompanied by her son today.  Allergies:  Allergies  Allergen Reactions  . Byetta 10 Mcg Pen [Exenatide] Nausea Only  . Hctz [Hydrochlorothiazide] Nausea Only  . Penicillins Itching  . Plavix [Clopidogrel] Nausea Only    Current Medications: Current Outpatient Prescriptions  Medication Sig Dispense Refill  . amLODipine (NORVASC) 5 MG tablet Take 5 mg by mouth daily.    . benazepril (LOTENSIN) 40 MG tablet Take 1 tablet (40 mg total) by mouth daily. 90 tablet 1  . cholecalciferol (VITAMIN D) 1000 UNITS tablet Take 1,000 Units by mouth daily.    Marland Kitchen co-enzyme Q-10 30 MG capsule Take 30 mg by mouth daily.    . Fluticasone-Salmeterol (ADVAIR DISKUS) 500-50 MCG/DOSE AEPB Inhale 1 puff into the lungs 2 (two) times daily.    Marland Kitchen glimepiride (AMARYL) 4 MG tablet Take 1 tablet (4 mg total) by mouth daily with breakfast. 90 tablet 1  . glucose monitoring kit (FREESTYLE) monitoring kit 1 each by Does not apply route as needed for other.    . Ipratropium-Albuterol (COMBIVENT RESPIMAT) 20-100 MCG/ACT AERS respimat Inhale 1 puff into the lungs every 6 (six) hours.    . isosorbide mononitrate (IMDUR) 30 MG 24 hr tablet Take 30 mg by mouth daily.    Marland Kitchen levofloxacin (LEVAQUIN) 500 MG tablet Take 1 tablet (500 mg total) by mouth daily. 10 tablet 0  . Multiple Vitamins-Minerals (MULTIVITAMIN WITH MINERALS) tablet Take 1 tablet by mouth daily.    . Omega-3 Fatty Acids (FISH OIL) 1000 MG CAPS Take 1,000 capsules by mouth once.    . pravastatin (PRAVACHOL) 80  MG tablet Take 1 tablet (80 mg total) by mouth once. 90 tablet 1  . predniSONE (DELTASONE) 20 MG tablet Take 3 pills daily x 2 days, then 2 pills daily x 2 days, then 1 pill daily x 2 days; take with food 12 tablet 0  . aspirin 81 MG tablet Take 81 mg by mouth 2 (two) times daily.     . busPIRone (BUSPAR) 10 MG tablet Take 10 mg by mouth 2 (two) times daily as needed.    . citalopram (CELEXA) 20 MG tablet Take 20 mg by mouth daily.    . fluticasone (FLONASE) 50 MCG/ACT nasal spray Place 2 sprays  into both nostrils daily. (Patient not taking: Reported on 06/23/2014) 15.8 g 11  . metFORMIN (GLUCOPHAGE-XR) 500 MG 24 hr tablet Take 2 tablets (1,000 mg total) by mouth 2 (two) times daily. Take 2 tablets twice a day (Patient not taking: Reported on 06/23/2014) 360 tablet 1   No current facility-administered medications for this visit.    Review of Systems:  GENERAL: Feels "ok". No fevers. Some sweats. 20 pound weight loss in the past 4 months. PERFORMANCE STATUS (ECOG): 1 HEENT: Runny nose secondary to allergies. No visual changes, sore throat, mouth sores or tenderness. Lungs: Chronic shortness of breath. Clear cough. No hemoptysis. Cardiac: No chest pain, palpitations, orthopnea, or PND. GI: Good appetite. No nausea, vomiting, diarrhea, constipation, melena or hematochezia. GU: No urgency, frequency, dysuria, or hematuria. Musculoskeletal: No back pain. No joint pain. No muscle tenderness. Extremities: No pain or swelling. Skin: No rashes or skin changes. Neuro: No headache, numbness or weakness, balance or coordination issues. Endocrine: No diabetes, thyroid issues, hot flashes or night sweats. Psych: No mood changes, depression or anxiety. Pain: No focal pain. Review of systems: All other systems reviewed and found to be negative.  Physical Exam: Blood pressure 112/57, pulse 82, temperature 98 F (36.7 C), temperature source  Tympanic, height 5' 6.73" (1.695 m), weight 273 lb 2.4 oz (123.9 kg), SpO2 93 %.  GENERAL: Well developed, well nourished, sitting comfortably in the exam room in no acute distress. MENTAL STATUS: Alert and oriented to person, place and time. HEAD: Short dark brown hair. Normocephalic, atraumatic, face symmetric, no Cushingoid features. EYES: Pupils equal round and reactive to light and accomodation. No conjunctivitis or scleral icterus. ENT: Oropharynx clear without lesion. Dentures. Tongue normal. Mucous membranes moist.  RESPIRATORY: Clear to auscultation without rales, wheezes or rhonchi. CARDIOVASCULAR: Regular rate and rhythm without murmur, rub or gallop. ABDOMEN: Fully round. Soft, non-tender, with active bowel sounds, and no hepatosplenomegaly. No masses. SKIN: No rashes, ulcers or lesions. EXTREMITIES: No edema, no skin discoloration or tenderness. No palpable cords. LYMPH NODES: No palpable cervical, supraclavicular, axillary or inguinal adenopathy  NEUROLOGICAL: Alert & oriented, cranial nerves II-XII intact; motor strength 5/5 throughout; sensation intact; slight shakey finger to nose and RAM normal; slight trouble with walk heel to toe walk; bilateral patella DTR 2+; negative Rhomberg; no clonus or Babinski. PSYCH: Appropriate.   Office Visit on 06/23/2014  Component Date Value Ref Range Status  . CEA 06/23/2014 18.3* 0.0 - 4.7 ng/mL Final   Comment: (NOTE)  Roche ECLIA methodology Nonsmokers <3.9  Smokers <5.6 Performed At: [facility] [address] [place], [place] [id number] Lindon Romp MD YB:[id number]     Assessment: DUDLEY COOLEY is a 74 y.o. female with a 80-100 pack smoking history presents with a right sided lung mass on screening chest CT. Chest CT on 06/18/2014 revealed a 5.0 x 3.8 x 4.7 cm right retrohilar mass in the superior segment of the right lower lobe. Mass  was spiculated neoplasm likely representing a primary pulmonary neoplasm. The mass was contiguous with and indents/narrows the bronchus intermedius. There was mediastinal and suspected right hilar adenopathy measuring between 1.1 and 1.9 cm. There were a few tiny nonspecific pulmonary nodules for which metastasis could not be excluded. There was an infiltrate within the superior segment of the left lower lobe suggesting pneumonia.  Symptomatically, she has chronic shortness of breath and a clear cough. She completed a course of doxycycline and is currently on Levaquin for pneumonia documented by CT. She has  been on oxygen for the past 4 months (2 liters/min). She has lost 20 pounds intentionally over the past 4 months. Neurologic exam is normal except for slight tremor and difficulty with heel to toe walk. CBC and CMP were normal on 06/15/2014.   Plan: 1. I reviewed with patient details of the study. I discussed the likely diagnosis of primary lung cancer. I discussed both small cell and non-small cell disease. Based on imaging studies, I suspect she has stage III disease. I discussed the plan for labs (CEA) and PET scan. I discussed referral to pulmonary medicine for bronchoscopy and biopsy. I discussed treatment of lung cancer. I discussed the role of surgery, radiation, and chemotherapy in general terms. I am unsure if she has resectable disease. I discussed concurernt chemotherapy and radiation for stage III and chemotherapy alone for metastatic disease. 2. Labs today: CEA. 3. Referral to pulmonary medicine for bronchoscopy and biopsy. 4. Anticipate head imaging after tissue diagnosis. 5. RTC after above to discuss direction of therapy.  Lequita Asal, MD  06/23/2014, 5:47 PM             Psych Notes     No notes of this type exist for this encounter.     [facility] Patient Outreach     No notes of this type exist for this encounter.     Not recorded     Orders  Placed This Encounter     Future Labs/Procedures Expected by Expires    NM PET Image Initial (PI) Skull Base To Thigh [[id number] Custom] 06/24/2014 (Approximate) 06/23/2015    CEA [LAB57 Custom] As directed 06/23/2015    Results are available for this encounter           Referring Provider     Arnetha Courser, MD     Level of Service     PR OFFICE OUTPATIENT NEW 60 MINUTES [[id number]]    LOS History      Follow-up and Disposition     Follow-up and Disposition History       All Flowsheet Templates (all recorded)     ABUSE SCREENING    Anthropometrics    [facility] RN Assess    Custom Formula Data    Distress Screening Tool    Fall Assesment    Healthcare Directives    Infectious Disease Screening    ONC AMB COMPLEX VITALS      Referring Provider     Arnetha Courser, MD     All Charges for This Encounter     Code Description Service Date Service Provider Modifiers Qty    [id number] [facility] CARCINOEMBRYONIC ANTIGEN 06/23/2014   1    [id number] [facility] NEW PATIENT LEVEL 5 06/23/2014 Lequita Asal, MD  1    [id number] PR OFFICE OUTPATIENT NEW 60 MINUTES 06/23/2014 Lequita Asal, MD 26 1      Routing History     There are no sent or routed communications associated with this encounter.     AVS Reports     No AVS Snapshots are available for this encounter.     Smoking Cessation Audit Trail       Diabetic Foot Exam    No data filed     Diabetic Foot Form - Detailed    No data filed     Diabetic Foot Exam - Simple    No data filed     Guarantor Account: Annayah, [place] ([id number])     Relation to Patient:  Account Type Service Area    Self Personal/Family [place] for This Account     Coverage ID Payor Plan Insurance ID    [id number] [employer] [facility] ADVANTAGE [id number]        Guarantor Account: Nasra, Counce ([id number])     Relation to Patient:  Account Type Service Area    Self Personal/Family [place] for This Account     Coverage ID Payor Plan Insurance ID    [phone] [id number] [employer] [facility] ADVANTAGE [id number]        Guarantor Account: Dalasia, [place] ([id number])     Relation to Patient: Account Type Service Area    Self Personal/Family [place]

## 2014-07-08 NOTE — Anesthesia Preprocedure Evaluation (Signed)
Anesthesia Evaluation  Patient identified by MRN, date of birth, ID band Patient awake    Reviewed: Allergy & Precautions, H&P , NPO status , Patient's Chart, lab work & pertinent test results, reviewed documented beta blocker date and time   Airway Mallampati: III  TM Distance: >3 FB Neck ROM: full    Dental  (+) Upper Dentures, Lower Dentures   Pulmonary shortness of breath and at rest, COPD COPD inhaler and oxygen dependent, former smoker,  breath sounds clear to auscultation  Pulmonary exam normal       Cardiovascular hypertension, - Past MI Normal cardiovascular examRhythm:regular Rate:Normal     Neuro/Psych  Headaches, negative psych ROS   GI/Hepatic negative GI ROS, Neg liver ROS,   Endo/Other  negative endocrine ROSdiabetes  Renal/GU negative Renal ROS  negative genitourinary   Musculoskeletal   Abdominal   Peds  Hematology negative hematology ROS (+)   Anesthesia Other Findings   Reproductive/Obstetrics negative OB ROS                             Anesthesia Physical Anesthesia Plan  ASA: III  Anesthesia Plan: General   Post-op Pain Management:    Induction:   Airway Management Planned:   Additional Equipment:   Intra-op Plan:   Post-operative Plan:   Informed Consent: I have reviewed the patients History and Physical, chart, labs and discussed the procedure including the risks, benefits and alternatives for the proposed anesthesia with the patient or authorized representative who has indicated his/her understanding and acceptance.   Dental Advisory Given  Plan Discussed with: Anesthesiologist, CRNA and Surgeon  Anesthesia Plan Comments:         Anesthesia Quick Evaluation

## 2014-07-09 ENCOUNTER — Encounter: Payer: Self-pay | Admitting: Hematology and Oncology

## 2014-07-12 ENCOUNTER — Ambulatory Visit: Payer: PPO | Admitting: Family Medicine

## 2014-07-13 ENCOUNTER — Encounter: Payer: Self-pay | Admitting: Hematology and Oncology

## 2014-07-13 ENCOUNTER — Telehealth: Payer: Self-pay | Admitting: *Deleted

## 2014-07-13 ENCOUNTER — Inpatient Hospital Stay: Payer: PPO | Attending: Hematology and Oncology | Admitting: Hematology and Oncology

## 2014-07-13 VITALS — BP 147/78 | HR 82 | Temp 96.3°F | Resp 22 | Ht 66.0 in | Wt 276.2 lb

## 2014-07-13 DIAGNOSIS — R269 Unspecified abnormalities of gait and mobility: Secondary | ICD-10-CM | POA: Diagnosis not present

## 2014-07-13 DIAGNOSIS — R918 Other nonspecific abnormal finding of lung field: Secondary | ICD-10-CM | POA: Diagnosis not present

## 2014-07-13 DIAGNOSIS — E785 Hyperlipidemia, unspecified: Secondary | ICD-10-CM | POA: Diagnosis not present

## 2014-07-13 DIAGNOSIS — Z87891 Personal history of nicotine dependence: Secondary | ICD-10-CM | POA: Diagnosis not present

## 2014-07-13 DIAGNOSIS — Z8701 Personal history of pneumonia (recurrent): Secondary | ICD-10-CM | POA: Insufficient documentation

## 2014-07-13 DIAGNOSIS — C3431 Malignant neoplasm of lower lobe, right bronchus or lung: Secondary | ICD-10-CM | POA: Diagnosis not present

## 2014-07-13 DIAGNOSIS — Z7982 Long term (current) use of aspirin: Secondary | ICD-10-CM | POA: Diagnosis not present

## 2014-07-13 DIAGNOSIS — Z79899 Other long term (current) drug therapy: Secondary | ICD-10-CM | POA: Diagnosis not present

## 2014-07-13 DIAGNOSIS — R0602 Shortness of breath: Secondary | ICD-10-CM

## 2014-07-13 DIAGNOSIS — F419 Anxiety disorder, unspecified: Secondary | ICD-10-CM | POA: Diagnosis not present

## 2014-07-13 DIAGNOSIS — Z8673 Personal history of transient ischemic attack (TIA), and cerebral infarction without residual deficits: Secondary | ICD-10-CM | POA: Diagnosis not present

## 2014-07-13 DIAGNOSIS — C3491 Malignant neoplasm of unspecified part of right bronchus or lung: Secondary | ICD-10-CM

## 2014-07-13 DIAGNOSIS — F329 Major depressive disorder, single episode, unspecified: Secondary | ICD-10-CM

## 2014-07-13 DIAGNOSIS — R251 Tremor, unspecified: Secondary | ICD-10-CM | POA: Diagnosis not present

## 2014-07-13 DIAGNOSIS — R590 Localized enlarged lymph nodes: Secondary | ICD-10-CM | POA: Diagnosis not present

## 2014-07-13 DIAGNOSIS — J449 Chronic obstructive pulmonary disease, unspecified: Secondary | ICD-10-CM | POA: Diagnosis not present

## 2014-07-13 DIAGNOSIS — I10 Essential (primary) hypertension: Secondary | ICD-10-CM | POA: Diagnosis not present

## 2014-07-13 DIAGNOSIS — E119 Type 2 diabetes mellitus without complications: Secondary | ICD-10-CM

## 2014-07-13 LAB — CYTOLOGY - NON PAP

## 2014-07-13 LAB — SURGICAL PATHOLOGY

## 2014-07-13 NOTE — Progress Notes (Signed)
[facility] day:  07/13/2014  Chief Complaint: Sherry Mueller is a 74 y.o. female with a lung mass who is seen for review of interval PET scan, bronchoscopy with biopsy, and discussion regarding direction of therapy.Marland Kitchen  HPI: The patient was last seen in the medical oncology clinic on 06/23/2014.  At that time, she was seen for initial consultation regarding a lung mass.  She had presented with a right sided lung mass.  There was mediastinal and suspected right hilar adenopathy.  She had completed a course of doxycycline for left lower lobe pneumonia and was on a course of Levaquin.  We discussed referral to pulmonary medicine for bronchoscopy and biopsy.  A PET scan was scheduled.  CEA was 18.3.  PET scan on 06/29/2014 revealed hypermetabolic right lower lobe pulmonary mass consistent with bronchogenic carcinoma.  There were hypermetabolic left mediastinal lymph nodes and prevascular nodes consistent with nodal metastasis.  There was a nodular consolidation within the medial left lower lobe.  There were bilateral supraclavicular node metastasis.  Bronchoscopy with biopsy on 07/08/2014 by Dr. Vilinda Boehringer revealed extrinsic compression of the right lower lobe secondary to mass.  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  Symptomatically , she notes chronic shortness of breath. Cough is productive of clear mucus. She denies any hemoptysis. She denies any bone pain.   Past Medical History  Diagnosis Date  . Stroke   . Hyperlipidemia   . CHF (congestive heart failure)   . Cancer   . Hypertension   . COPD (chronic obstructive pulmonary disease)   . Shortness of breath dyspnea   . Diabetes mellitus without complication   . Depression   . Anxiety   . Headache     Past Surgical History  Procedure Laterality Date  . Breast surgery Left cyst removed  . Cardiac catheterization    . Colonoscopy      Family History   Problem Relation Age of Onset  . Heart disease Mother   . Hypertension Mother   . Heart disease Father   . Stroke Father     Social History:  reports that she quit smoking about 7 years ago. Her smoking use included Cigarettes. She smoked 2.00 packs per day. She has never used smokeless tobacco. She reports that she does not drink alcohol or use illicit drugs.  The patient is accompanied by her son.  Allergies:  Allergies  Allergen Reactions  . Byetta 10 Mcg Pen [Exenatide] Nausea Only  . Hctz [Hydrochlorothiazide] Nausea Only  . Penicillins Itching  . Plavix [Clopidogrel] Nausea Only  . Plavix [Clopidogrel] Nausea And Vomiting    Current Medications: Current Outpatient Prescriptions  Medication Sig Dispense Refill  . albuterol-ipratropium (COMBIVENT) 18-103 MCG/ACT inhaler Inhale 2 puffs into the lungs every 4 (four) hours as needed for wheezing or shortness of breath.    Marland Kitchen amLODipine (NORVASC) 5 MG tablet Take 5 mg by mouth at bedtime.    . B Complex Vitamins (B COMPLEX PO) Take 1 tablet by mouth every morning.    . benazepril (LOTENSIN) 40 MG tablet Take 1 tablet (40 mg total) by mouth daily. 90 tablet 1  . busPIRone (BUSPAR) 10 MG tablet Take 10 mg by mouth 2 (two) times daily as needed.    . Cholecalciferol (VITAMIN D3) 2000 UNITS TABS Take 1 tablet by mouth every morning.    . citalopram (CELEXA) 40 MG tablet Take 40 mg by mouth at bedtime.    Marland Kitchen  co-enzyme Q-10 30 MG capsule Take 30 mg by mouth daily.    . fluticasone (FLONASE) 50 MCG/ACT nasal spray Place 2 sprays into both nostrils daily. 15.8 g 11  . Fluticasone-Salmeterol (ADVAIR DISKUS) 250-50 MCG/DOSE AEPB Inhale 1 puff into the lungs as needed.    Marland Kitchen glimepiride (AMARYL) 4 MG tablet Take 1 tablet (4 mg total) by mouth daily with breakfast. 90 tablet 1  . glucose monitoring kit (FREESTYLE) monitoring kit 1 each by Does not apply route as needed for other.    . Ipratropium-Albuterol (COMBIVENT RESPIMAT) 20-100 MCG/ACT  AERS respimat Inhale 1 puff into the lungs every 6 (six) hours.    . isosorbide mononitrate (IMDUR) 30 MG 24 hr tablet Take 30 mg by mouth daily.    Marland Kitchen levofloxacin (LEVAQUIN) 500 MG tablet Take 1 tablet (500 mg total) by mouth daily. 10 tablet 0  . metFORMIN (GLUCOPHAGE-XR) 500 MG 24 hr tablet Take 2 tablets (1,000 mg total) by mouth 2 (two) times daily. Take 2 tablets twice a day 360 tablet 1  . Multiple Vitamins-Minerals (MULTIVITAMIN PO) Take 1 tablet by mouth 2 (two) times daily.    . Omega-3 Fatty Acids (FISH OIL PO) Take 1,000 mg by mouth every morning.    . pravastatin (PRAVACHOL) 80 MG tablet Take 80 mg by mouth at bedtime.    Marland Kitchen aspirin 81 MG tablet Take 81 mg by mouth 2 (two) times daily.      No current facility-administered medications for this visit.    Review of Systems:  GENERAL:  No new symptoms. 20 pound weight loss in the past 4 months. PERFORMANCE STATUS (ECOG):  1 HEENT:  Runny nose.  No visual changes, sore throat, mouth sores or tenderness. Lungs: Chronic shortness of breath.  Clear cough.  No hemoptysis. Cardiac:  No chest pain, palpitations, orthopnea, or PND. GI:  Good appetite.  No nausea, vomiting, diarrhea, constipation, melena or hematochezia. GU:  No urgency, frequency, dysuria, or hematuria. Musculoskeletal:  No back pain.  No joint pain.  No muscle tenderness. Extremities:  No pain or swelling. Skin:  No rashes or skin changes. Neuro:  No headache, numbness or weakness, balance or coordination issues. Endocrine:  No diabetes, thyroid issues, hot flashes or night sweats. Psych:  No mood changes, depression or anxiety. Pain:  No focal pain. Review of systems:  All other systems reviewed and found to be negative.   Physical Exam: Blood pressure 147/78, pulse 82, temperature 96.3 F (35.7 C), temperature source Tympanic, resp. rate 22, height 5' 6"  (1.676 m), weight 276 lb 3.8 oz (125.3 kg).  GENERAL:  Well developed, well nourished, sitting comfortably in  the exam room in no acute distress. MENTAL STATUS:  Alert and oriented to person, place and time. HEAD:  Short dark brown hair.  Normocephalic, atraumatic, face symmetric, no Cushingoid features. EYES:  No conjunctivitis or scleral icterus. RESPIRATORY:  Clear to auscultation without rales, wheezes or rhonchi. CARDIOVASCULAR:  Regular rate and rhythm without murmur, rub or gallop. PSYCH:  Appropriate.   No visits with results within 3 Day(s) from this visit. Latest known visit with results is:  Admission on 07/08/2014, Discharged on 07/08/2014  Component Date Value Ref Range Status  . Glucose-Capillary 07/08/2014 131* 65 - 99 mg/dL Final  . Comment 1 07/08/2014 Notify RN   Final  . CYTOLOGY - NON GYN 07/08/2014    Final                   Value:Cytology -  Non PAP CASE: [id number] PATIENT: Sherry Mueller Non-Gyn Cytology Report     SPECIMEN SUBMITTED: A. FNA, Hilar 4R/10R  CLINICAL HISTORY: None provided  PRE-OPERATIVE DIAGNOSIS: 74 yo with prior hx of smoking, now with hilar mass, malignancy  POST-OPERATIVE DIAGNOSIS: None Provided     DIAGNOSIS: A. HILAR, 4R/10R, EBUS GUIDED FNA: - POSITIVE FOR MALIGNANT CELLS, CONSISTENT WITH SQUAMOUS CELL CARCINOMA (SEE COMMENT).  Comment:  A cell block was included in the interpretation of this case.   GROSS DESCRIPTION:  A. Site: hilar 4R/10R Procedure: EBUS Cytotechnologist: Ashlee Howze and Rivka Barbara Specimen(s) collected: 4 Diff Quik stained slides 4 Pap stained slides Specimen labeled patient's name and medical record number :      Description: pink CytoLyt solution with multiple cores/fragments of white-tan tissue, 2.2 x 0.5 x 0.1 cm      Submitted for:           ThinPrep           Cell block(s): 1-2  A forceps biopsy was obtained an                         d will be reported in a separate ARS report. Final Diagnosis performed by Delorse Lek, MD.  Electronically signed 07/13/2014 11:11:06AM    The  electronic signature indicates that the named Attending Pathologist has evaluated the specimen  Technical component performed at [facility], [address] [place], [place] [address] Lab: [phone] Dir: Darrick Penna. Evette Doffing, MD  Professional component performed at [facility], [facility], [place], [place], [place] [address] Lab: [phone] Dir: Dellia Nims. Rubinas, MD    . CYTOLOGY - NON GYN 07/08/2014    Final                   Value:Cytology - Non PAP CASE: [id number] PATIENT: Sherry Mueller Non-Gyn Cytology Report     SPECIMEN SUBMITTED: A. FNA lung, right, brushing  CLINICAL HISTORY: None provided  PRE-OPERATIVE DIAGNOSIS: 74 yo F with previous hx of smoking now with R Hilar lung mass, malignancy  POST-OPERATIVE DIAGNOSIS: None Provided     DIAGNOSIS: A. LUNG, RIGHT; BRUSHING AND FINE-NEEDLE ASPIRATION: - POSITIVE FOR MALIGNANT CELLS, CONSISTENT WITH SQUAMOUS CELL CARCINOMA.    GROSS DESCRIPTION:  A. Site: hilar 4R/10R Procedure: bronchoscopy Cytotechnologist: Georgina Snell and Rivka Barbara Specimen(s) collected: 2 Diff Quik stained slides Specimen labeled brush:      Description: pink CytoLyt with 2 metallic brushes      Submitted for:           ThinPrep  A forceps biopsy was obtained and will be reported in a separate ARS report. Final Diagnosis performed by Delorse Lek, MD.  Electronically signed 07/13/2014 11:23:36AM    The electronic signature indicates that th                         e named Attending Pathologist has evaluated the specimen  Technical component performed at [facility], [address], [place], [place] [address] Lab: [phone] Dir: Darrick Penna. Evette Doffing, MD  Professional component performed at [facility], [facility], [place], [place], [place] [address] Lab: [phone] Dir: Dellia Nims. Reuel Derby, MD    . SURGICAL PATHOLOGY 07/08/2014    Final                   Value:Surgical  Pathology CASE: [id number] PATIENT: Sherry Mueller Surgical Pathology Report  SPECIMEN SUBMITTED: A. Lung, distal right mainstem, biopsy  CLINICAL HISTORY: None provided  PRE-OPERATIVE DIAGNOSIS: 74 yo female previous hx of smoking, now with right hilar mass  POST-OPERATIVE DIAGNOSIS: None provided     DIAGNOSIS: A. LUNG, DISTAL RIGHT MAINSTEM; BIOPSY: - SQUAMOUS CELL CARCINOMA, SEE COMMENT.  Comment: A limited panel of immunohistochemical stains were performed. TTF-1 is nonreactive while p40 is immunoreactive, supporting the diagnosis. The control slides stained appropriately.   GROSS DESCRIPTION:  A. Labeled: patient's name medical record number and requisition stating hilar 4 R/10 R Tissue Fragment(s): multiple Measurement: aggregate, 0.6 x 0.2 x 0.1 cm Comment: soft pink to red fragments, wrapped in lens paper marked with eosin and submitted in a mesh bag  2 Diff Quik stained slides prepared   Entirely submitted in cassette(s                         ): 1   Final Diagnosis performed by Delorse Lek, MD.  Electronically signed 07/13/2014 11:01:32AM    The electronic signature indicates that the named Attending Pathologist has evaluated the specimen  Technical component performed at [facility], [address] [place], [place] [address] Lab: [phone] Dir: Darrick Penna. Evette Doffing, MD  Professional component performed at [facility], [facility], [place], [place], [place] [address] Lab: [phone] Dir: Dellia Nims. Reuel Derby, MD      Assessment:  Sherry Mueller is a 74 y.o. 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  She has a 80-100 pack smoking history.  She presented with a right sided lung mass on screening chest CT.  Chest CT on 06/18/2014 revealed a 5.0 x 3.8 x 4.7 cm right retrohilar mass in the superior segment of the right lower lobe. The mass was contiguous with and indents/narrows the bronchus  intermedius. There was mediastinal and suspected right hilar adenopathy measuring between 1.1 and 1.9 cm. There were a few tiny nonspecific pulmonary nodules. There was an infiltrate within the superior segment of the left lower lobe suggesting pneumonia.  PET scan on 06/29/2014 revealed hypermetabolic right lower lobe pulmonary mass consistent with bronchogenic carcinoma.  There were hypermetabolic left mediastinal lymph nodes and prevascular nodes consistent with nodal metastasis.  There was a nodular consolidation within the medial left lower lobe.  There were bilateral supraclavicular node metastasis.  Bronchoscopy with biopsy on 07/08/2014 revealed extrinsic compression of the right lower lobe secondary to mass.  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  Symptomatically, she has chronic shortness of breath and a clear cough. She completed a course of doxycycline and Levaquin for pneumonia documented by CT. She has been on oxygen for the past 4 months (2 liters/min). She has lost 20 pounds intentionally over the past 4 months. Neurologic exam is normal except for slight tremor and difficulty with heel to toe walk.  CBC and CMP were normal on 06/15/2014.    Plan: 1. Review PET scan.  Imaging suggests stage IIIB disease.  Discuss pathology.  Discuss tumor is inoperable.  Discuss chemotherapy (low dose carboplatin and Taxol) with radiation.  Discuss patient and son's concern about radiation.  Discuss consult with radiation oncology.  Discuss consult with surgery to confirm unresectability (family wary).  Discuss need for baseline head CT given patient's advanced disease. 2. Head CT with contrast. 3. Consult Dr Genevive Bi, thoracic surgeon. 4. Consult Dr. Baruch Gouty, radiation oncologist. 5. Present patient at tumor board on 07/15/2014. 6. Discuss possible supraclavicular biopsy.  7. RTC after consults to finalize treatment plan.    Lequita Asal, MD  07/13/2014,  4:425 PM

## 2014-07-13 NOTE — Telephone Encounter (Signed)
Advise patient that Cancer center will refer her to see a pulmonologist and they will require a walk test and breathing test for certain treatments.

## 2014-07-13 NOTE — Telephone Encounter (Signed)
Called patient to schedule f/u after bronchoscopy. Pt prefers a early morning appt. I offered 07/28/14 10:30. SMW/PFT Pt says she can barely walk without holding on to something. She has cancer so why is this necessary. She has trouble breathing in hot temperatures. Explained to patient this is the only AM appt availble. Please advise.

## 2014-07-14 ENCOUNTER — Ambulatory Visit: Payer: Self-pay | Admitting: Hematology and Oncology

## 2014-07-15 ENCOUNTER — Ambulatory Visit: Payer: Self-pay | Admitting: Cardiothoracic Surgery

## 2014-07-15 ENCOUNTER — Telehealth: Payer: Self-pay | Admitting: *Deleted

## 2014-07-15 DIAGNOSIS — C3490 Malignant neoplasm of unspecified part of unspecified bronchus or lung: Secondary | ICD-10-CM | POA: Insufficient documentation

## 2014-07-15 NOTE — Telephone Encounter (Signed)
  Oncology Nurse Navigator Documentation    Navigator Encounter Type: Telephone (07/15/14 1100)               Contacted patient to give update appointments for Ct of head, radiation oncology, thoracic surgery, and medical oncology appointments. Discussed at length planned treatment with patient and her son Sherry Mueller. Discussed likely treatment regimen as well as some side effects and management of treatment. Verbalized understanding. Instructed to call for further questions or concerns.

## 2014-07-15 NOTE — Telephone Encounter (Signed)
Called patient and explained to her why Dr. Stevenson [facility] needs SMW/PFT. Patient began cussing and asked that I not call her back again. She says she is on 02 and can hardly walk around  in her own house therefore, she certainly couldn't walk six minutes.

## 2014-07-19 ENCOUNTER — Ambulatory Visit: Payer: PPO

## 2014-07-20 ENCOUNTER — Other Ambulatory Visit: Payer: Self-pay | Admitting: *Deleted

## 2014-07-21 ENCOUNTER — Ambulatory Visit
Admission: RE | Admit: 2014-07-21 | Discharge: 2014-07-21 | Disposition: A | Discharge status: Home / Self Care | Payer: PPO | Source: Ambulatory Visit | Attending: Hematology and Oncology | Admitting: Hematology and Oncology

## 2014-07-21 ENCOUNTER — Institutional Professional Consult (permissible substitution): Payer: Self-pay | Admitting: Radiation Oncology

## 2014-07-21 ENCOUNTER — Ambulatory Visit: Payer: Self-pay | Admitting: Hematology and Oncology

## 2014-07-21 DIAGNOSIS — C3490 Malignant neoplasm of unspecified part of unspecified bronchus or lung: Secondary | ICD-10-CM | POA: Diagnosis present

## 2014-07-21 DIAGNOSIS — G319 Degenerative disease of nervous system, unspecified: Secondary | ICD-10-CM | POA: Diagnosis not present

## 2014-07-21 DIAGNOSIS — R918 Other nonspecific abnormal finding of lung field: Secondary | ICD-10-CM

## 2014-07-21 MED ORDER — IOHEXOL 300 MG/ML  SOLN
75.0000 mL | Freq: Once | INTRAMUSCULAR | Status: AC | PRN
  Administered 2014-07-21: 75 mL via INTRAVENOUS

## 2014-07-22 ENCOUNTER — Inpatient Hospital Stay (HOSPITAL_BASED_OUTPATIENT_CLINIC_OR_DEPARTMENT_OTHER): Payer: PPO | Admitting: Cardiothoracic Surgery

## 2014-07-22 ENCOUNTER — Ambulatory Visit
Admission: RE | Admit: 2014-07-22 | Discharge: 2014-07-22 | Disposition: A | Discharge status: Home / Self Care | Payer: PPO | Source: Ambulatory Visit | Attending: Radiation Oncology | Admitting: Radiation Oncology

## 2014-07-22 ENCOUNTER — Encounter: Payer: Self-pay | Admitting: Radiation Oncology

## 2014-07-22 VITALS — BP 106/59 | HR 86 | Temp 97.4°F | Resp 22 | Wt 272.0 lb

## 2014-07-22 DIAGNOSIS — C799 Secondary malignant neoplasm of unspecified site: Secondary | ICD-10-CM

## 2014-07-22 DIAGNOSIS — C3431 Malignant neoplasm of lower lobe, right bronchus or lung: Secondary | ICD-10-CM | POA: Insufficient documentation

## 2014-07-22 DIAGNOSIS — Z85118 Personal history of other malignant neoplasm of bronchus and lung: Secondary | ICD-10-CM

## 2014-07-22 DIAGNOSIS — Z87891 Personal history of nicotine dependence: Secondary | ICD-10-CM | POA: Insufficient documentation

## 2014-07-22 DIAGNOSIS — Z510 Encounter for antineoplastic radiation therapy: Secondary | ICD-10-CM | POA: Insufficient documentation

## 2014-07-22 DIAGNOSIS — C3491 Malignant neoplasm of unspecified part of right bronchus or lung: Secondary | ICD-10-CM

## 2014-07-22 NOTE — Progress Notes (Signed)
Patient ID: Sherry Mueller, female   DOB: 1940/11/30, 74 y.o.   MRN: [id number]  No chief complaint on file.   Referred By Dr. Nolon Stalls Reason for Referral consideration of surgical resection of right lower lobe mass with contralateral metastases  HPI Location, Quality, Duration, Severity, Timing, Context, Modifying Factors, Associated Signs and Symptoms.  Sherry Mueller is a 74 y.o. female.  I have personally seen and examined this patient. I have discussed her care with Drs. Crystal and [employer]. I have independently reviewed her CT scans and PET scans.  This patient is a 68 year old white female who had a chest x-ray made for evaluation of a persistent cough. She states that about one year ago she saw her primary care physician Dr. Varney Biles who performed a chest x-ray. This revealed a possible mass in the left lower lobe and a subsequent follow-up x-ray performed about one month ago showed a large right lower lobe mass. The patient had a CT scan performed revealing a right lower lobe mass with extensive mediastinal and supraclavicular adenopathy. A PET scan was performed confirming the hypermetabolic activity of these various nodes. In addition she had a bronchoscopy showing squamous cell carcinoma. The patient states that her cough has not improved. She has not coughed up any blood. She has had a 20 pound weight loss which she states is related to dieting. She gets quite short of breath with even minimal exertion. She was seen by Dr. Donella Stade and Dr. Mike Gip who both recommended radiation therapy and chemotherapy and I am now being asked to give my thoughts on whether or not surgical intervention may offer some benefit.  The patient denied any hemoptysis fevers or chills. She does have some shortness of breath and cough. There has been a 20 pound weight loss but this has been intentional from dieting. She states that she gets short of breath with minimal activities. She has a prior  smoking history of 2 packs a day for many years but quit about 10 years ago.   Past Medical History  Diagnosis Date  . Stroke   . Hyperlipidemia   . CHF (congestive heart failure)   . Cancer   . Hypertension   . COPD (chronic obstructive pulmonary disease)   . Shortness of breath dyspnea   . Diabetes mellitus without complication   . Depression   . Anxiety   . Headache     Past Surgical History  Procedure Laterality Date  . Breast surgery Left cyst removed  . Cardiac catheterization    . Colonoscopy      Family History  Problem Relation Age of Onset  . Heart disease Mother   . Hypertension Mother   . Heart disease Father   . Stroke Father     Social History History  Substance Use Topics  . Smoking status: Former Smoker -- 2.00 packs/day    Types: Cigarettes    Quit date: 06/15/2007  . Smokeless tobacco: Never Used  . Alcohol Use: No    Allergies  Allergen Reactions  . Byetta 10 Mcg Pen [Exenatide] Nausea Only  . Hctz [Hydrochlorothiazide] Nausea Only  . Penicillins Itching  . Plavix [Clopidogrel] Nausea Only  . Plavix [Clopidogrel] Nausea And Vomiting    Current Outpatient Prescriptions  Medication Sig Dispense Refill  . albuterol-ipratropium (COMBIVENT) 18-103 MCG/ACT inhaler Inhale 2 puffs into the lungs every 4 (four) hours as needed for wheezing or shortness of breath.    Marland Kitchen amLODipine (NORVASC) 5 MG tablet Take  5 mg by mouth at bedtime.    Marland Kitchen aspirin 81 MG tablet Take 81 mg by mouth 2 (two) times daily.     . B Complex Vitamins (B COMPLEX PO) Take 1 tablet by mouth every morning.    . benazepril (LOTENSIN) 40 MG tablet Take 1 tablet (40 mg total) by mouth daily. 90 tablet 1  . busPIRone (BUSPAR) 10 MG tablet Take 10 mg by mouth 2 (two) times daily as needed.    . Cholecalciferol (VITAMIN D3) 2000 UNITS TABS Take 1 tablet by mouth every morning.    . citalopram (CELEXA) 40 MG tablet Take 40 mg by mouth at bedtime.    Marland Kitchen co-enzyme Q-10 30 MG capsule Take  30 mg by mouth daily.    . fluticasone (FLONASE) 50 MCG/ACT nasal spray Place 2 sprays into both nostrils daily. 15.8 g 11  . Fluticasone-Salmeterol (ADVAIR DISKUS) 250-50 MCG/DOSE AEPB Inhale 1 puff into the lungs as needed.    Marland Kitchen glimepiride (AMARYL) 4 MG tablet Take 1 tablet (4 mg total) by mouth daily with breakfast. 90 tablet 1  . glucose monitoring kit (FREESTYLE) monitoring kit 1 each by Does not apply route as needed for other.    . Ipratropium-Albuterol (COMBIVENT RESPIMAT) 20-100 MCG/ACT AERS respimat Inhale 1 puff into the lungs every 6 (six) hours.    . isosorbide mononitrate (IMDUR) 30 MG 24 hr tablet Take 30 mg by mouth daily.    Marland Kitchen levofloxacin (LEVAQUIN) 500 MG tablet Take 1 tablet (500 mg total) by mouth daily. 10 tablet 0  . metFORMIN (GLUCOPHAGE-XR) 500 MG 24 hr tablet Take 2 tablets (1,000 mg total) by mouth 2 (two) times daily. Take 2 tablets twice a day 360 tablet 1  . Multiple Vitamins-Minerals (MULTIVITAMIN PO) Take 1 tablet by mouth 2 (two) times daily.    . Omega-3 Fatty Acids (FISH OIL PO) Take 1,000 mg by mouth every morning.    . pravastatin (PRAVACHOL) 80 MG tablet Take 80 mg by mouth at bedtime.     No current facility-administered medications for this visit.      Review of Systems A complete review of systems was asked and was negative except for the following positive findings weight loss, shortness of breath when lying flat, cough, shortness of breath, wheezing, frequent urination, excessive urination.  There were no vitals taken for this visit.  Physical Exam CONSTITUTIONAL:  Pleasant, well-developed, well-nourished, and in no acute distress. EYES: Pupils equal and reactive to light, Sclera non-icteric EARS, NOSE, MOUTH AND THROAT:  The oropharynx was clear.  Dentition is good repair.  Oral mucosa pink and moist. LYMPH NODES:  Lymph nodes in the neck and axillae were normal RESPIRATORY:  Lungs were clear.  Normal respiratory effort without pathologic use  of accessory muscles of respiration. CARDIOVASCULAR: Heart was regular without murmurs.  There were no carotid bruits. GI: The abdomen was soft, nontender, and nondistended. There were no palpable masses. There was no hepatosplenomegaly. There were normal bowel sounds in all quadrants. GU:  Rectal deferred.   MUSCULOSKELETAL:  Normal muscle strength and tone.  No clubbing or cyanosis.   SKIN:  There were no pathologic skin lesions.  There were no nodules on palpation. NEUROLOGIC:  Sensation is normal.  Cranial nerves are grossly intact. PSYCH:  Oriented to person, place and time.  Mood and affect are normal.  Data Reviewed CT scan and PET scan  I have personally reviewed the patient's imaging, laboratory findings and medical records.  Assessment    I had a long discussion with the patient and her son. I believe that she has at least stage IIIB disease and I do not believe that surgical intervention will offer her any significant benefits. I also had an opportunity to discuss the placement of a Port-A-Cath. However the patient has refused and would like to proceed on with radiation therapy and chemotherapy without placement of a Port-A-Cath.    Plan    I will refer the patient back to Drs. Corcoran and Dr. Donella Stade for definitive management of her unresectable lung cancer.       Nestor Lewandowsky, MD 07/22/2014, 10:56 AM

## 2014-07-22 NOTE — Consult Note (Signed)
Except an outstanding is perfect of Radiation Oncology NEW PATIENT EVALUATION  Name: Sherry Mueller  MRN: [id number]  Date:   07/22/2014     DOB: 1940-09-18   This 74 y.o. female patient presents to the clinic for initial evaluation of stage IIIB lung cancer.  REFERRING PHYSICIAN: Arnetha Courser, MD  CHIEF COMPLAINT:  Chief Complaint  Patient presents with  . Lung Cancer    Pt is here for initial consultation of lung cancer.      DIAGNOSIS: The encounter diagnosis was Personal history of malignant neoplasm of bronchus and lung.   PREVIOUS INVESTIGATIONS:  CT scan PET CT scans reviewed Surgical pathology report reviewed Clinical notes reviewed  HPI: patient is a rather nervous morbidly obese 74 year old female chronic smoker since age of 89 with 2-3 packs a day up until approximately 8 years prior. She underwent surveillance CT scan about a year and a half prior's showing a small lung nodule. She recently underwent a repeat CT scanshowing a 5 x 3.8 cm mass with spiculated margins in the superior segment of the right lower lobe was also suspected mediastinal and right hilar adenopathy.PET scan confirmed right lower lobe pulmonary lesion was hypermetabolic consistent with primary bronchogenic carcinoma. She also hypermetabolic left mediastinal and prevascular lymph nodes consistent with nodal metastasis. She also had bilateral hypermetabolic supraclavicular lymph nodes.biopsy of the distal right mainstem was positive for squamous cell carcinoma. P 40 was immunoreactive supporting the diagnosis of squamous cell carcinoma.patient does have chronic shortness of breath and a clear mucus productive cough. She denies hemoptysis. PET CT scan demonstrated no evidence of distant metastatic disease. She is now referred to radiation oncology for consideration of treatment.  PLANNED TREATMENT REGIMEN: concurrent chemoradiation with curative intent.  PAST MEDICAL HISTORY:  has a past medical history of  Stroke; Hyperlipidemia; CHF (congestive heart failure); Cancer; Hypertension; COPD (chronic obstructive pulmonary disease); Shortness of breath dyspnea; Diabetes mellitus without complication; Depression; Anxiety; and Headache.    PAST SURGICAL HISTORY:  Past Surgical History  Procedure Laterality Date  . Breast surgery Left cyst removed  . Cardiac catheterization    . Colonoscopy      FAMILY HISTORY: family history includes Heart disease in her father and mother; Hypertension in her mother; Stroke in her father.  SOCIAL HISTORY:  reports that she quit smoking about 7 years ago. Her smoking use included Cigarettes. She smoked 2.00 packs per day. She has never used smokeless tobacco. She reports that she does not drink alcohol or use illicit drugs.  ALLERGIES: Byetta 10 mcg pen; Hctz; Penicillins; Plavix; and Plavix  MEDICATIONS:  Current Outpatient Prescriptions  Medication Sig Dispense Refill  . albuterol-ipratropium (COMBIVENT) 18-103 MCG/ACT inhaler Inhale 2 puffs into the lungs every 4 (four) hours as needed for wheezing or shortness of breath.    Marland Kitchen amLODipine (NORVASC) 5 MG tablet Take 5 mg by mouth at bedtime.    Marland Kitchen aspirin 81 MG tablet Take 81 mg by mouth 2 (two) times daily.     . B Complex Vitamins (B COMPLEX PO) Take 1 tablet by mouth every morning.    . benazepril (LOTENSIN) 40 MG tablet Take 1 tablet (40 mg total) by mouth daily. 90 tablet 1  . busPIRone (BUSPAR) 10 MG tablet Take 10 mg by mouth 2 (two) times daily as needed.    . Cholecalciferol (VITAMIN D3) 2000 UNITS TABS Take 1 tablet by mouth every morning.    . citalopram (CELEXA) 40 MG tablet Take 40 mg by  mouth at bedtime.    Marland Kitchen co-enzyme Q-10 30 MG capsule Take 30 mg by mouth daily.    . fluticasone (FLONASE) 50 MCG/ACT nasal spray Place 2 sprays into both nostrils daily. 15.8 g 11  . Fluticasone-Salmeterol (ADVAIR DISKUS) 250-50 MCG/DOSE AEPB Inhale 1 puff into the lungs as needed.    Marland Kitchen glimepiride (AMARYL) 4 MG  tablet Take 1 tablet (4 mg total) by mouth daily with breakfast. 90 tablet 1  . glucose monitoring kit (FREESTYLE) monitoring kit 1 each by Does not apply route as needed for other.    . Ipratropium-Albuterol (COMBIVENT RESPIMAT) 20-100 MCG/ACT AERS respimat Inhale 1 puff into the lungs every 6 (six) hours.    . isosorbide mononitrate (IMDUR) 30 MG 24 hr tablet Take 30 mg by mouth daily.    Marland Kitchen levofloxacin (LEVAQUIN) 500 MG tablet Take 1 tablet (500 mg total) by mouth daily. 10 tablet 0  . metFORMIN (GLUCOPHAGE-XR) 500 MG 24 hr tablet Take 2 tablets (1,000 mg total) by mouth 2 (two) times daily. Take 2 tablets twice a day 360 tablet 1  . Multiple Vitamins-Minerals (MULTIVITAMIN PO) Take 1 tablet by mouth 2 (two) times daily.    . Omega-3 Fatty Acids (FISH OIL PO) Take 1,000 mg by mouth every morning.    . pravastatin (PRAVACHOL) 80 MG tablet Take 80 mg by mouth at bedtime.     No current facility-administered medications for this encounter.    ECOG PERFORMANCE STATUS:  1 - Symptomatic but completely ambulatory  REVIEW OF SYSTEMS: except for a mucus cough and slight nonintentional tremor Patient denies any weight loss, fatigue, weakness, fever, chills or night sweats. Patient denies any loss of vision, blurred vision. Patient denies any ringing  of the ears or hearing loss. No irregular heartbeat. Patient denies heart murmur or history of fainting. Patient denies any chest pain or pain radiating to her upper extremities. Patient denies any shortness of breath, difficulty breathing at night, cough or hemoptysis. Patient denies any swelling in the lower legs. Patient denies any nausea vomiting, vomiting of blood, or coffee ground material in the vomitus. Patient denies any stomach pain. Patient states has had normal bowel movements no significant constipation or diarrhea. Patient denies any dysuria, hematuria or significant nocturia. Patient denies any problems walking, swelling in the joints or loss of  balance. Patient denies any skin changes, loss of hair or loss of weight. Patient denies any excessive worrying or anxiety or significant depression. Patient denies any problems with insomnia. Patient denies excessive thirst, polyuria, polydipsia. Patient denies any swollen glands, patient denies easy bruising or easy bleeding. Patient denies any recent infections, allergies or URI. Patient "s visual fields have not changed significantly in recent time.    PHYSICAL EXAM: BP 106/59 mmHg  Pulse 86  Temp(Src) 97.4 F (36.3 C)  Resp 22  Wt 272 lb 0.8 oz (123.4 kg) Well-developed morbidly obese female with tremor an obvious cough. I cannot detect any cervical or supraclavicular adenopathy. Well-developed well-nourished patient in NAD. HEENT reveals PERLA, EOMI, discs not visualized.  Oral cavity is clear. No oral mucosal lesions are identified. Neck is clear without evidence of cervical or supraclavicular adenopathy. Lungs are clear to A&P. Cardiac examination is essentially unremarkable with regular rate and rhythm without murmur rub or thrill. Abdomen is benign with no organomegaly or masses noted. Motor sensory and DTR levels are equal and symmetric in the upper and lower extremities. Cranial nerves II through XII are grossly intact. Proprioception is intact. No  peripheral adenopathy or edema is identified. No motor or sensory levels are noted. Crude visual fields are within normal range.   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 Patient is amenable to going ahead with combined modality treatment with chemotherapy and radiation therapy. Based on the patient's poor  lung function try to spare as much normal lung as possible. I am not convinced she does have bilateral lymph node involvement in the supraclavicular fossa is based on a PET CT scan although I have not will review that at our weekly tumor conference. I would plan on delivering 6000 cGy to both her primary tumor in the right lower lobe as well as or mediastinal adenopathy. I would use I am RT treatment planning and delivery to spare critical structures such as overexposure to the heart as well as to decrease normal lung exposure. Risks and benefits of treatment including possible dysphasia secondary to radiation esophagitis, worsening of her cough, alteration of blood counts, skin reaction, fatigue, all were discussed in detail. She seems to comprehend my treatment plan well. We are also arranging for a port replaced and I have ordered and scheduled CT simulation for next week.  I would like to take this opportunity for allowing me to participate in the care of your patient.Armstead Peaks., MD

## 2014-07-23 ENCOUNTER — Telehealth: Payer: Self-pay | Admitting: *Deleted

## 2014-07-23 ENCOUNTER — Ambulatory Visit: Payer: PPO | Admitting: Family Medicine

## 2014-07-23 ENCOUNTER — Other Ambulatory Visit: Payer: Self-pay | Admitting: Hematology and Oncology

## 2014-07-23 NOTE — Telephone Encounter (Signed)
  Oncology Nurse Navigator Documentation    Navigator Encounter Type: Telephone (07/23/14 0900)

## 2014-07-26 ENCOUNTER — Inpatient Hospital Stay: Payer: PPO | Admitting: Hematology and Oncology

## 2014-07-27 ENCOUNTER — Ambulatory Visit
Admission: RE | Admit: 2014-07-27 | Discharge: 2014-07-27 | Disposition: A | Discharge status: Home / Self Care | Payer: PPO | Source: Ambulatory Visit | Attending: Radiation Oncology | Admitting: Radiation Oncology

## 2014-07-27 ENCOUNTER — Ambulatory Visit: Payer: PPO

## 2014-08-03 ENCOUNTER — Telehealth: Payer: Self-pay

## 2014-08-03 NOTE — Telephone Encounter (Signed)
Received telephone called from patient asking why she had to attend chemo class.  Informed patient I would give her a binder with information about her particular cancer treatments along with her chemotherapy.  Informed patient I would make her aware of all the resources the cancer center could offer her throughout her treatments.  Also talk about managing side effects.  Pt states, "Lady, I have a college education and I do not want to sit and listen to you talk for three hours".  Patient asked if she could come and get binder and her read it herself.  Informed patient she could get binder while at radiation tomorrow and I would give her a call on Thursday to review contents of binder.  Pt agreed to this arrangement.  Encouraged patient after reading binder tomorrow she was welcome to come to the chemo class on Thursday.

## 2014-08-03 NOTE — Patient Instructions (Signed)
Paclitaxel injection What is this medicine? PACLITAXEL (PAK li TAX el) is a chemotherapy drug. It targets fast dividing cells, like cancer cells, and causes these cells to die. This medicine is used to treat ovarian cancer, breast cancer, and other cancers. This medicine may be used for other purposes; ask your health care provider or pharmacist if you have questions. COMMON BRAND NAME(S): Onxol, Taxol What should I tell my health care provider before I take this medicine? They need to know if you have any of these conditions: -blood disorders -irregular heartbeat -infection (especially a virus infection such as chickenpox, cold sores, or herpes) -liver disease -previous or ongoing radiation therapy -an unusual or allergic reaction to paclitaxel, alcohol, polyoxyethylated castor oil, other chemotherapy agents, other medicines, foods, dyes, or preservatives -pregnant or trying to get pregnant -breast-feeding How should I use this medicine? This drug is given as an infusion into a vein. It is administered in a hospital or clinic by a specially trained health care professional. Talk to your pediatrician regarding the use of this medicine in children. Special care may be needed. Overdosage: If you think you have taken too much of this medicine contact a poison control center or emergency room at once. NOTE: This medicine is only for you. Do not share this medicine with others. What if I miss a dose? It is important not to miss your dose. Call your doctor or health care professional if you are unable to keep an appointment. What may interact with this medicine? Do not take this medicine with any of the following medications: -disulfiram -metronidazole This medicine may also interact with the following medications: -cyclosporine -diazepam -ketoconazole -medicines to increase blood counts like filgrastim, pegfilgrastim, sargramostim -other chemotherapy drugs like cisplatin, doxorubicin,  epirubicin, etoposide, teniposide, vincristine -quinidine -testosterone -vaccines -verapamil Talk to your doctor or health care professional before taking any of these medicines: -acetaminophen -aspirin -ibuprofen -ketoprofen -naproxen This list may not describe all possible interactions. Give your health care provider a list of all the medicines, herbs, non-prescription drugs, or dietary supplements you use. Also tell them if you smoke, drink alcohol, or use illegal drugs. Some items may interact with your medicine. What should I watch for while using this medicine? Your condition will be monitored carefully while you are receiving this medicine. You will need important blood work done while you are taking this medicine. This drug may make you feel generally unwell. This is not uncommon, as chemotherapy can affect healthy cells as well as cancer cells. Report any side effects. Continue your course of treatment even though you feel ill unless your doctor tells you to stop. In some cases, you may be given additional medicines to help with side effects. Follow all directions for their use. Call your doctor or health care professional for advice if you get a fever, chills or sore throat, or other symptoms of a cold or flu. Do not treat yourself. This drug decreases your body's ability to fight infections. Try to avoid being around people who are sick. This medicine may increase your risk to bruise or bleed. Call your doctor or health care professional if you notice any unusual bleeding. Be careful brushing and flossing your teeth or using a toothpick because you may get an infection or bleed more easily. If you have any dental work done, tell your dentist you are receiving this medicine. Avoid taking products that contain aspirin, acetaminophen, ibuprofen, naproxen, or ketoprofen unless instructed by your doctor. These medicines may hide a fever.   Do not become pregnant while taking this medicine.  Women should inform their doctor if they wish to become pregnant or think they might be pregnant. There is a potential for serious side effects to an unborn child. Talk to your health care professional or pharmacist for more information. Do not breast-feed an infant while taking this medicine. Men are advised not to father a child while receiving this medicine. What side effects may I notice from receiving this medicine? Side effects that you should report to your doctor or health care professional as soon as possible: -allergic reactions like skin rash, itching or hives, swelling of the face, lips, or tongue -low blood counts - This drug may decrease the number of white blood cells, red blood cells and platelets. You may be at increased risk for infections and bleeding. -signs of infection - fever or chills, cough, sore throat, pain or difficulty passing urine -signs of decreased platelets or bleeding - bruising, pinpoint red spots on the skin, black, tarry stools, nosebleeds -signs of decreased red blood cells - unusually weak or tired, fainting spells, lightheadedness -breathing problems -chest pain -high or low blood pressure -mouth sores -nausea and vomiting -pain, swelling, redness or irritation at the injection site -pain, tingling, numbness in the hands or feet -slow or irregular heartbeat -swelling of the ankle, feet, hands Side effects that usually do not require medical attention (report to your doctor or health care professional if they continue or are bothersome): -bone pain -complete hair loss including hair on your head, underarms, pubic hair, eyebrows, and eyelashes -changes in the color of fingernails -diarrhea -loosening of the fingernails -loss of appetite -muscle or joint pain -red flush to skin -sweating This list may not describe all possible side effects. Call your doctor for medical advice about side effects. You may report side effects to FDA at  1-800-FDA-1088. Where should I keep my medicine? This drug is given in a hospital or clinic and will not be stored at home. NOTE: This sheet is a summary. It may not cover all possible information. If you have questions about this medicine, talk to your doctor, pharmacist, or health care provider.  2015, [employer] Standard. (2012-02-11 16:41:21)  Carboplatin injection What is this medicine? CARBOPLATIN (KAR boe pla tin) is a chemotherapy drug. It targets fast dividing cells, like cancer cells, and causes these cells to die. This medicine is used to treat ovarian cancer and many other cancers. This medicine may be used for other purposes; ask your health care provider or pharmacist if you have questions. COMMON BRAND NAME(S): Paraplatin What should I tell my health care provider before I take this medicine? They need to know if you have any of these conditions: -blood disorders -hearing problems -kidney disease -recent or ongoing radiation therapy -an unusual or allergic reaction to carboplatin, cisplatin, other chemotherapy, other medicines, foods, dyes, or preservatives -pregnant or trying to get pregnant -breast-feeding How should I use this medicine? This drug is usually given as an infusion into a vein. It is administered in a hospital or clinic by a specially trained health care professional. Talk to your pediatrician regarding the use of this medicine in children. Special care may be needed. Overdosage: If you think you have taken too much of this medicine contact a poison control center or emergency room at once. NOTE: This medicine is only for you. Do not share this medicine with others. What if I miss a dose? It is important not to miss a dose. Call your   doctor or health care professional if you are unable to keep an appointment. What may interact with this medicine? -medicines for seizures -medicines to increase blood counts like filgrastim, pegfilgrastim,  sargramostim -some antibiotics like amikacin, gentamicin, neomycin, streptomycin, tobramycin -vaccines Talk to your doctor or health care professional before taking any of these medicines: -acetaminophen -aspirin -ibuprofen -ketoprofen -naproxen This list may not describe all possible interactions. Give your health care provider a list of all the medicines, herbs, non-prescription drugs, or dietary supplements you use. Also tell them if you smoke, drink alcohol, or use illegal drugs. Some items may interact with your medicine. What should I watch for while using this medicine? Your condition will be monitored carefully while you are receiving this medicine. You will need important blood work done while you are taking this medicine. This drug may make you feel generally unwell. This is not uncommon, as chemotherapy can affect healthy cells as well as cancer cells. Report any side effects. Continue your course of treatment even though you feel ill unless your doctor tells you to stop. In some cases, you may be given additional medicines to help with side effects. Follow all directions for their use. Call your doctor or health care professional for advice if you get a fever, chills or sore throat, or other symptoms of a cold or flu. Do not treat yourself. This drug decreases your body's ability to fight infections. Try to avoid being around people who are sick. This medicine may increase your risk to bruise or bleed. Call your doctor or health care professional if you notice any unusual bleeding. Be careful brushing and flossing your teeth or using a toothpick because you may get an infection or bleed more easily. If you have any dental work done, tell your dentist you are receiving this medicine. Avoid taking products that contain aspirin, acetaminophen, ibuprofen, naproxen, or ketoprofen unless instructed by your doctor. These medicines may hide a fever. Do not become pregnant while taking this  medicine. Women should inform their doctor if they wish to become pregnant or think they might be pregnant. There is a potential for serious side effects to an unborn child. Talk to your health care professional or pharmacist for more information. Do not breast-feed an infant while taking this medicine. What side effects may I notice from receiving this medicine? Side effects that you should report to your doctor or health care professional as soon as possible: -allergic reactions like skin rash, itching or hives, swelling of the face, lips, or tongue -signs of infection - fever or chills, cough, sore throat, pain or difficulty passing urine -signs of decreased platelets or bleeding - bruising, pinpoint red spots on the skin, black, tarry stools, nosebleeds -signs of decreased red blood cells - unusually weak or tired, fainting spells, lightheadedness -breathing problems -changes in hearing -changes in vision -chest pain -high blood pressure -low blood counts - This drug may decrease the number of white blood cells, red blood cells and platelets. You may be at increased risk for infections and bleeding. -nausea and vomiting -pain, swelling, redness or irritation at the injection site -pain, tingling, numbness in the hands or feet -problems with balance, talking, walking -trouble passing urine or change in the amount of urine Side effects that usually do not require medical attention (report to your doctor or health care professional if they continue or are bothersome): -hair loss -loss of appetite -metallic taste in the mouth or changes in taste This list may not describe all   possible side effects. Call your doctor for medical advice about side effects. You may report side effects to FDA at 1-800-FDA-1088. Where should I keep my medicine? This drug is given in a hospital or clinic and will not be stored at home. NOTE: This sheet is a summary. It may not cover all possible information. If you  have questions about this medicine, talk to your doctor, pharmacist, or health care provider.  2015, [employer] Standard. (2007-03-25 14:38:05)  

## 2014-08-04 ENCOUNTER — Ambulatory Visit
Admission: RE | Admit: 2014-08-04 | Discharge: 2014-08-04 | Disposition: A | Discharge status: Home / Self Care | Payer: PPO | Source: Ambulatory Visit | Attending: Radiation Oncology | Admitting: Radiation Oncology

## 2014-08-04 DIAGNOSIS — Z87891 Personal history of nicotine dependence: Secondary | ICD-10-CM | POA: Diagnosis not present

## 2014-08-04 DIAGNOSIS — C3431 Malignant neoplasm of lower lobe, right bronchus or lung: Secondary | ICD-10-CM | POA: Diagnosis not present

## 2014-08-04 DIAGNOSIS — Z510 Encounter for antineoplastic radiation therapy: Secondary | ICD-10-CM | POA: Diagnosis present

## 2014-08-05 ENCOUNTER — Ambulatory Visit: Payer: PPO

## 2014-08-05 ENCOUNTER — Inpatient Hospital Stay: Payer: PPO | Attending: Internal Medicine

## 2014-08-05 DIAGNOSIS — J449 Chronic obstructive pulmonary disease, unspecified: Secondary | ICD-10-CM | POA: Insufficient documentation

## 2014-08-05 DIAGNOSIS — Z7982 Long term (current) use of aspirin: Secondary | ICD-10-CM | POA: Insufficient documentation

## 2014-08-05 DIAGNOSIS — Z510 Encounter for antineoplastic radiation therapy: Secondary | ICD-10-CM | POA: Diagnosis not present

## 2014-08-05 DIAGNOSIS — Z79899 Other long term (current) drug therapy: Secondary | ICD-10-CM | POA: Insufficient documentation

## 2014-08-05 DIAGNOSIS — F419 Anxiety disorder, unspecified: Secondary | ICD-10-CM | POA: Insufficient documentation

## 2014-08-05 DIAGNOSIS — C771 Secondary and unspecified malignant neoplasm of intrathoracic lymph nodes: Secondary | ICD-10-CM | POA: Insufficient documentation

## 2014-08-05 DIAGNOSIS — Z87891 Personal history of nicotine dependence: Secondary | ICD-10-CM | POA: Insufficient documentation

## 2014-08-05 DIAGNOSIS — F329 Major depressive disorder, single episode, unspecified: Secondary | ICD-10-CM | POA: Insufficient documentation

## 2014-08-05 DIAGNOSIS — E785 Hyperlipidemia, unspecified: Secondary | ICD-10-CM | POA: Insufficient documentation

## 2014-08-05 DIAGNOSIS — C3492 Malignant neoplasm of unspecified part of left bronchus or lung: Secondary | ICD-10-CM | POA: Insufficient documentation

## 2014-08-05 DIAGNOSIS — Z8673 Personal history of transient ischemic attack (TIA), and cerebral infarction without residual deficits: Secondary | ICD-10-CM | POA: Insufficient documentation

## 2014-08-05 DIAGNOSIS — I10 Essential (primary) hypertension: Secondary | ICD-10-CM | POA: Insufficient documentation

## 2014-08-05 DIAGNOSIS — R0602 Shortness of breath: Secondary | ICD-10-CM | POA: Insufficient documentation

## 2014-08-05 DIAGNOSIS — E1165 Type 2 diabetes mellitus with hyperglycemia: Secondary | ICD-10-CM | POA: Insufficient documentation

## 2014-08-09 ENCOUNTER — Ambulatory Visit: Payer: PPO

## 2014-08-10 ENCOUNTER — Ambulatory Visit: Payer: PPO

## 2014-08-11 ENCOUNTER — Ambulatory Visit: Payer: PPO

## 2014-08-11 DIAGNOSIS — Z510 Encounter for antineoplastic radiation therapy: Secondary | ICD-10-CM | POA: Diagnosis not present

## 2014-08-12 ENCOUNTER — Ambulatory Visit: Payer: PPO

## 2014-08-13 ENCOUNTER — Ambulatory Visit: Payer: PPO

## 2014-08-14 ENCOUNTER — Encounter: Payer: Self-pay | Admitting: Hematology and Oncology

## 2014-08-14 ENCOUNTER — Encounter: Payer: Self-pay | Admitting: Family Medicine

## 2014-08-16 ENCOUNTER — Ambulatory Visit: Payer: PPO

## 2014-08-17 ENCOUNTER — Ambulatory Visit: Payer: PPO

## 2014-08-17 ENCOUNTER — Other Ambulatory Visit: Payer: Self-pay | Admitting: *Deleted

## 2014-08-17 DIAGNOSIS — C3431 Malignant neoplasm of lower lobe, right bronchus or lung: Secondary | ICD-10-CM

## 2014-08-17 DIAGNOSIS — Z510 Encounter for antineoplastic radiation therapy: Secondary | ICD-10-CM | POA: Diagnosis not present

## 2014-08-17 MED ORDER — GUAIFENESIN-CODEINE 100-10 MG/5ML PO SOLN: 5.0000 mL | Freq: Three times a day (TID) | ORAL | Status: DC | PRN

## 2014-08-17 MED ORDER — DEXAMETHASONE 4 MG PO TABS: 4.0000 mg | ORAL_TABLET | Freq: Every day | ORAL | Status: DC

## 2014-08-17 MED ORDER — LANSOPRAZOLE 30 MG PO CPDR: 30.0000 mg | DELAYED_RELEASE_CAPSULE | Freq: Every day | ORAL | Status: DC

## 2014-08-18 ENCOUNTER — Ambulatory Visit: Payer: PPO

## 2014-08-18 ENCOUNTER — Ambulatory Visit
Admission: RE | Admit: 2014-08-18 | Discharge: 2014-08-18 | Disposition: A | Discharge status: Home / Self Care | Payer: PPO | Source: Ambulatory Visit | Attending: Radiation Oncology | Admitting: Radiation Oncology

## 2014-08-18 ENCOUNTER — Ambulatory Visit: Admission: RE | Admit: 2014-08-18 | Payer: PPO | Source: Ambulatory Visit

## 2014-08-19 ENCOUNTER — Ambulatory Visit: Payer: PPO

## 2014-08-19 ENCOUNTER — Ambulatory Visit
Admission: RE | Admit: 2014-08-19 | Discharge: 2014-08-19 | Disposition: A | Discharge status: Home / Self Care | Payer: PPO | Source: Ambulatory Visit | Attending: Radiation Oncology | Admitting: Radiation Oncology

## 2014-08-19 DIAGNOSIS — Z510 Encounter for antineoplastic radiation therapy: Secondary | ICD-10-CM | POA: Diagnosis not present

## 2014-08-20 ENCOUNTER — Ambulatory Visit: Payer: PPO

## 2014-08-20 ENCOUNTER — Inpatient Hospital Stay: Payer: PPO

## 2014-08-20 ENCOUNTER — Telehealth: Payer: Self-pay

## 2014-08-20 ENCOUNTER — Ambulatory Visit
Admission: RE | Admit: 2014-08-20 | Discharge: 2014-08-20 | Disposition: A | Discharge status: Home / Self Care | Payer: PPO | Source: Ambulatory Visit | Attending: Radiation Oncology | Admitting: Radiation Oncology

## 2014-08-20 ENCOUNTER — Inpatient Hospital Stay: Payer: PPO | Admitting: Internal Medicine

## 2014-08-20 ENCOUNTER — Inpatient Hospital Stay: Payer: PPO | Admitting: Hematology and Oncology

## 2014-08-20 DIAGNOSIS — Z510 Encounter for antineoplastic radiation therapy: Secondary | ICD-10-CM | POA: Diagnosis not present

## 2014-08-20 NOTE — Telephone Encounter (Signed)
Pt showed up today and said that she was cancelled or not on schedule.  PT told scheduling that she didn't need chemo or to see the doctor anyway.  Scheduling called me and asked if that was true and we told her that we were waiting on Dr. Baruch Gouty to confirm she is doing her radiation and to give me five minutes to make sure she was and she would need to see Dr. Mike Gip.  Dr. Baruch Gouty said yes she had been seeing them.  Pts son went to scheduling and told him she does not need chemo that she was getting radiation and they left without speaking with RN.  PT was placed again on schedule to see md on 8/26

## 2014-08-23 ENCOUNTER — Ambulatory Visit
Admission: RE | Admit: 2014-08-23 | Discharge: 2014-08-23 | Disposition: A | Discharge status: Home / Self Care | Payer: PPO | Source: Ambulatory Visit | Attending: Radiation Oncology | Admitting: Radiation Oncology

## 2014-08-23 ENCOUNTER — Ambulatory Visit: Payer: PPO

## 2014-08-23 DIAGNOSIS — Z510 Encounter for antineoplastic radiation therapy: Secondary | ICD-10-CM | POA: Diagnosis not present

## 2014-08-24 ENCOUNTER — Ambulatory Visit: Payer: PPO

## 2014-08-24 ENCOUNTER — Ambulatory Visit
Admission: RE | Admit: 2014-08-24 | Discharge: 2014-08-24 | Disposition: A | Discharge status: Home / Self Care | Payer: PPO | Source: Ambulatory Visit | Attending: Radiation Oncology | Admitting: Radiation Oncology

## 2014-08-24 DIAGNOSIS — Z510 Encounter for antineoplastic radiation therapy: Secondary | ICD-10-CM | POA: Diagnosis not present

## 2014-08-25 ENCOUNTER — Ambulatory Visit: Payer: PPO

## 2014-08-25 ENCOUNTER — Ambulatory Visit
Admission: RE | Admit: 2014-08-25 | Discharge: 2014-08-25 | Disposition: A | Discharge status: Home / Self Care | Payer: PPO | Source: Ambulatory Visit | Attending: Radiation Oncology | Admitting: Radiation Oncology

## 2014-08-25 DIAGNOSIS — Z510 Encounter for antineoplastic radiation therapy: Secondary | ICD-10-CM | POA: Diagnosis not present

## 2014-08-26 ENCOUNTER — Ambulatory Visit
Admission: RE | Admit: 2014-08-26 | Discharge: 2014-08-26 | Disposition: A | Discharge status: Home / Self Care | Payer: PPO | Source: Ambulatory Visit | Attending: Radiation Oncology | Admitting: Radiation Oncology

## 2014-08-26 ENCOUNTER — Ambulatory Visit: Payer: PPO

## 2014-08-26 DIAGNOSIS — Z510 Encounter for antineoplastic radiation therapy: Secondary | ICD-10-CM | POA: Diagnosis not present

## 2014-08-27 ENCOUNTER — Inpatient Hospital Stay: Payer: PPO

## 2014-08-27 ENCOUNTER — Other Ambulatory Visit: Payer: Self-pay | Admitting: Hematology and Oncology

## 2014-08-27 ENCOUNTER — Ambulatory Visit: Payer: PPO

## 2014-08-27 ENCOUNTER — Ambulatory Visit
Admission: RE | Admit: 2014-08-27 | Discharge: 2014-08-27 | Disposition: A | Discharge status: Home / Self Care | Payer: PPO | Source: Ambulatory Visit | Attending: Radiation Oncology | Admitting: Radiation Oncology

## 2014-08-27 ENCOUNTER — Inpatient Hospital Stay (HOSPITAL_BASED_OUTPATIENT_CLINIC_OR_DEPARTMENT_OTHER): Payer: PPO | Admitting: Hematology and Oncology

## 2014-08-27 VITALS — BP 129/78 | HR 98 | Temp 98.4°F | Resp 18 | Wt 277.6 lb

## 2014-08-27 DIAGNOSIS — I10 Essential (primary) hypertension: Secondary | ICD-10-CM

## 2014-08-27 DIAGNOSIS — C3490 Malignant neoplasm of unspecified part of unspecified bronchus or lung: Secondary | ICD-10-CM

## 2014-08-27 DIAGNOSIS — F329 Major depressive disorder, single episode, unspecified: Secondary | ICD-10-CM | POA: Diagnosis not present

## 2014-08-27 DIAGNOSIS — Z7982 Long term (current) use of aspirin: Secondary | ICD-10-CM | POA: Diagnosis not present

## 2014-08-27 DIAGNOSIS — C771 Secondary and unspecified malignant neoplasm of intrathoracic lymph nodes: Secondary | ICD-10-CM

## 2014-08-27 DIAGNOSIS — Z8673 Personal history of transient ischemic attack (TIA), and cerebral infarction without residual deficits: Secondary | ICD-10-CM

## 2014-08-27 DIAGNOSIS — E785 Hyperlipidemia, unspecified: Secondary | ICD-10-CM

## 2014-08-27 DIAGNOSIS — J449 Chronic obstructive pulmonary disease, unspecified: Secondary | ICD-10-CM | POA: Diagnosis not present

## 2014-08-27 DIAGNOSIS — IMO0002 Reserved for concepts with insufficient information to code with codable children: Secondary | ICD-10-CM

## 2014-08-27 DIAGNOSIS — F419 Anxiety disorder, unspecified: Secondary | ICD-10-CM | POA: Diagnosis not present

## 2014-08-27 DIAGNOSIS — Z87891 Personal history of nicotine dependence: Secondary | ICD-10-CM

## 2014-08-27 DIAGNOSIS — E1165 Type 2 diabetes mellitus with hyperglycemia: Secondary | ICD-10-CM | POA: Diagnosis not present

## 2014-08-27 DIAGNOSIS — Z510 Encounter for antineoplastic radiation therapy: Secondary | ICD-10-CM | POA: Diagnosis not present

## 2014-08-27 DIAGNOSIS — C3492 Malignant neoplasm of unspecified part of left bronchus or lung: Secondary | ICD-10-CM | POA: Diagnosis not present

## 2014-08-27 DIAGNOSIS — Z79899 Other long term (current) drug therapy: Secondary | ICD-10-CM | POA: Diagnosis not present

## 2014-08-27 DIAGNOSIS — R0602 Shortness of breath: Secondary | ICD-10-CM | POA: Diagnosis not present

## 2014-08-27 LAB — CBC WITH DIFFERENTIAL/PLATELET
Basophils Absolute: 0 10*3/uL (ref 0–0.1)
Basophils Relative: 0 %
Eosinophils Absolute: 0 10*3/uL (ref 0–0.7)
Eosinophils Relative: 0 %
HCT: 40 % (ref 35.0–47.0)
Hemoglobin: 12.9 g/dL (ref 12.0–16.0)
Lymphocytes Relative: 6 %
Lymphs Abs: 0.7 10*3/uL — ABNORMAL LOW (ref 1.0–3.6)
MCH: 27.6 pg (ref 26.0–34.0)
MCHC: 32.3 g/dL (ref 32.0–36.0)
MCV: 85.5 fL (ref 80.0–100.0)
Monocytes Absolute: 0.5 10*3/uL (ref 0.2–0.9)
Monocytes Relative: 5 %
Neutro Abs: 10.2 10*3/uL — ABNORMAL HIGH (ref 1.4–6.5)
Neutrophils Relative %: 89 %
Platelets: 310 10*3/uL (ref 150–440)
RBC: 4.68 MIL/uL (ref 3.80–5.20)
RDW: 14.5 % (ref 11.5–14.5)
WBC: 11.5 10*3/uL — ABNORMAL HIGH (ref 3.6–11.0)

## 2014-08-27 LAB — COMPREHENSIVE METABOLIC PANEL
ALT: 13 U/L — ABNORMAL LOW (ref 14–54)
AST: 18 U/L (ref 15–41)
Albumin: 3.8 g/dL (ref 3.5–5.0)
Alkaline Phosphatase: 77 U/L (ref 38–126)
Anion gap: 8 (ref 5–15)
BUN: 18 mg/dL (ref 6–20)
CO2: 28 mmol/L (ref 22–32)
Calcium: 8.4 mg/dL — ABNORMAL LOW (ref 8.9–10.3)
Chloride: 94 mmol/L — ABNORMAL LOW (ref 101–111)
Creatinine, Ser: 0.5 mg/dL (ref 0.44–1.00)
GFR calc Af Amer: >60 mL/min (ref >60)
GFR calc non Af Amer: >60 mL/min (ref >60)
Glucose, Bld: 296 mg/dL — ABNORMAL HIGH (ref 65–99)
Potassium: 4.3 mmol/L (ref 3.5–5.1)
Sodium: 130 mmol/L — ABNORMAL LOW (ref 135–145)
Total Bilirubin: 0.9 mg/dL (ref 0.3–1.2)
Total Protein: 7.1 g/dL (ref 6.5–8.1)

## 2014-08-27 LAB — MAGNESIUM: Magnesium: 1.6 mg/dL — ABNORMAL LOW (ref 1.7–2.4)

## 2014-08-27 NOTE — Progress Notes (Signed)
[facility] day:  08/27/2014   Chief Complaint: LATERRA LUBINSKI is a 74 y.o. female with a lung mass who is seen for review of interval PET scan, bronchoscopy with biopsy, and discussion regarding direction of therapy.Marland Kitchen  HPI: The patient was last seen in the medical oncology clinic on 07/13/2014.  At that time, she was seen for review of interval PET scan, bronchoscopy with biopsy, and discussion regarding direction of therapy.  PET scan revealed hypermetabolic right lower lobe pulmonary mass consistent with bronchogenic carcinoma.  There were hypermetabolic left mediastinal lymph nodes and prevascular nodes consistent with nodal metastasis.  There was a nodular consolidation within the medial left lower lobe.  There were bilateral supraclavicular node metastasis.  Bronchoscopy with biopsy revealed extrinsic compression of the right lower lobe secondary to mass.  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  At last visit, we discussed referral to radiation oncology for concurrent chemotherapy and radiation for likely stage IIIB disease.  We discussed low dose carboplatin and Taxol with radiation.  She and her son were wary about radiation.  We discussed consultation with Dr. Nestor Lewandowsky to confirm unresectability.  The patient was seen by Dr. Genevive Bi on 07/22/2014 and confirmed that surgery would not offer her any significant benefit.  She declined port placement for chemotherapy.  Head CT on 07/21/2014 revealed no evidence of metastatic disease.  During the interim, she underwent CT simulation.  She has had 7 fractions of radiation (08/18/2014).  He has been on antibiotics since treatment. She was placed on steroids. Her blood sugar has been elevated.  Past Medical History  Diagnosis Date  . Stroke   . Hyperlipidemia   . CHF (congestive heart failure)   . Cancer   . Hypertension   . COPD (chronic obstructive pulmonary  disease)   . Shortness of breath dyspnea   . Diabetes mellitus without complication   . Depression   . Anxiety   . Headache     Past Surgical History  Procedure Laterality Date  . Breast surgery Left cyst removed  . Cardiac catheterization    . Colonoscopy      Family History  Problem Relation Age of Onset  . Heart disease Mother   . Hypertension Mother   . Heart disease Father   . Stroke Father     Social History:  reports that she quit smoking about 7 years ago. Her smoking use included Cigarettes. She smoked 2.00 packs per day. She has never used smokeless tobacco. She reports that she does not drink alcohol or use illicit drugs.  The patient is accompanied by her son.  Allergies:  Allergies  Allergen Reactions  . Byetta 10 Mcg Pen [Exenatide] Nausea Only  . Hctz [Hydrochlorothiazide] Nausea Only  . Penicillins Itching  . Plavix [Clopidogrel] Nausea Only  . Plavix [Clopidogrel] Nausea And Vomiting    Current Medications: Current Outpatient Prescriptions  Medication Sig Dispense Refill  . albuterol-ipratropium (COMBIVENT) 18-103 MCG/ACT inhaler Inhale 2 puffs into the lungs every 4 (four) hours as needed for wheezing or shortness of breath.    Marland Kitchen amLODipine (NORVASC) 5 MG tablet Take 5 mg by mouth at bedtime.    Marland Kitchen aspirin 81 MG tablet Take 81 mg by mouth 2 (two) times daily.     . B Complex Vitamins (B COMPLEX PO) Take 1 tablet by mouth every morning.    . benazepril (LOTENSIN) 40 MG tablet Take 1 tablet (40  mg total) by mouth daily. 90 tablet 1  . busPIRone (BUSPAR) 10 MG tablet Take 10 mg by mouth 2 (two) times daily as needed.    . Cholecalciferol (VITAMIN D3) 2000 UNITS TABS Take 1 tablet by mouth every morning.    . citalopram (CELEXA) 40 MG tablet Take 40 mg by mouth at bedtime.    Marland Kitchen co-enzyme Q-10 30 MG capsule Take 30 mg by mouth daily.    Marland Kitchen dexamethasone (DECADRON) 4 MG tablet Take 1 tablet (4 mg total) by mouth daily. Take 1 tablet daily for 10 days, then  take 1 tablet every other day until finished. 18 tablet 0  . fluticasone (FLONASE) 50 MCG/ACT nasal spray Place 2 sprays into both nostrils daily. 15.8 g 11  . Fluticasone-Salmeterol (ADVAIR DISKUS) 250-50 MCG/DOSE AEPB Inhale 1 puff into the lungs as needed.    Marland Kitchen glimepiride (AMARYL) 4 MG tablet Take 1 tablet (4 mg total) by mouth daily with breakfast. 90 tablet 1  . glucose monitoring kit (FREESTYLE) monitoring kit 1 each by Does not apply route as needed for other.    . guaiFENesin-codeine 100-10 MG/5ML syrup Take 5 mLs by mouth 3 (three) times daily as needed for cough. 120 mL 2  . Ipratropium-Albuterol (COMBIVENT RESPIMAT) 20-100 MCG/ACT AERS respimat Inhale 1 puff into the lungs every 6 (six) hours.    . isosorbide mononitrate (IMDUR) 30 MG 24 hr tablet Take 30 mg by mouth daily.    . lansoprazole (PREVACID) 30 MG capsule Take 1 capsule (30 mg total) by mouth daily at 12 noon. 30 capsule 2  . levofloxacin (LEVAQUIN) 500 MG tablet Take 1 tablet (500 mg total) by mouth daily. 10 tablet 0  . metFORMIN (GLUCOPHAGE-XR) 500 MG 24 hr tablet Take 2 tablets (1,000 mg total) by mouth 2 (two) times daily. Take 2 tablets twice a day 360 tablet 1  . Multiple Vitamins-Minerals (MULTIVITAMIN PO) Take 1 tablet by mouth 2 (two) times daily.    . Omega-3 Fatty Acids (FISH OIL PO) Take 1,000 mg by mouth every morning.    . pravastatin (PRAVACHOL) 80 MG tablet Take 80 mg by mouth at bedtime.     No current facility-administered medications for this visit.    Review of Systems:  GENERAL:  No new symptoms. No fevers, sweats or weight loss. PERFORMANCE STATUS (ECOG):  1 HEENT:  No visual changes, sore throat, mouth sores or tenderness. Lungs: Chronic shortness of breath.  Clear cough.  No hemoptysis. Cardiac:  No chest pain, palpitations, orthopnea, or PND. GI:  No nausea, vomiting, diarrhea, constipation, melena or hematochezia.  Watches what she eats secondary blood sugar.   GU:  No urgency, frequency,  dysuria, or hematuria. Musculoskeletal:  No back pain.  No joint pain.  No muscle tenderness. Extremities:  No pain or swelling. Skin:  No rashes or skin changes. Neuro:  No headache, numbness or weakness, balance or coordination issues. Endocrine:  Diabetes.  No thyroid issues, hot flashes or night sweats. Psych:  No mood changes, depression or anxiety. Pain:  No focal pain. Review of systems:  All other systems reviewed and found to be negative.   Physical Exam: Blood pressure 129/78, pulse 98, temperature 98.4 F (36.9 C), temperature source Tympanic, resp. rate 18, weight 277 lb 9 oz (125.9 kg).  GENERAL:  Well developed, well nourished, sitting comfortably in the exam room in no acute distress. MENTAL STATUS:  Alert and oriented to person, place and time. HEAD:  Short gray hair.  Normocephalic, atraumatic, face symmetric, no Cushingoid features. EYES:  Pupils equal round and reactive to light and accomodation.  No conjunctivitis or scleral icterus. ENT:  Oropharynx clear without lesion.  Tongue normal. Mucous membranes moist.  RESPIRATORY:  Clear to auscultation without rales, wheezes or rhonchi. CARDIOVASCULAR:  Regular rate and rhythm without murmur, rub or gallop. ABDOMEN:  Soft, non-tender, with active bowel sounds, and no appreciable hepatosplenomegaly.  No masses. SKIN:  No rashes, ulcers or lesions. EXTREMITIES: No edema, no skin discoloration or tenderness.  No palpable cords. LYMPH NODES: No palpable cervical, supraclavicular, axillary or inguinal adenopathy  NEUROLOGICAL: Unremarkable. PSYCH:  Appropriate.  Appointment on 08/27/2014  Component Date Value Ref Range Status  . Sodium 08/27/2014 130* 135 - 145 mmol/L Final  . Potassium 08/27/2014 4.3  3.5 - 5.1 mmol/L Final  . Chloride 08/27/2014 94* 101 - 111 mmol/L Final  . CO2 08/27/2014 28  22 - 32 mmol/L Final  . Glucose, Bld 08/27/2014 296* 65 - 99 mg/dL Final  . BUN 08/27/2014 18  6 - 20 mg/dL Final  . Creatinine,  Ser 08/27/2014 0.50  0.44 - 1.00 mg/dL Final  . Calcium 08/27/2014 8.4* 8.9 - 10.3 mg/dL Final  . Total Protein 08/27/2014 7.1  6.5 - 8.1 g/dL Final  . Albumin 08/27/2014 3.8  3.5 - 5.0 g/dL Final  . AST 08/27/2014 18  15 - 41 U/L Final  . ALT 08/27/2014 13* 14 - 54 U/L Final  . Alkaline Phosphatase 08/27/2014 77  38 - 126 U/L Final  . Total Bilirubin 08/27/2014 0.9  0.3 - 1.2 mg/dL Final  . GFR calc non Af Amer 08/27/2014 >60  >60 mL/min Final  . GFR calc Af Amer 08/27/2014 >60  >60 mL/min Final   Comment: (NOTE) The eGFR has been calculated using the CKD EPI equation. This calculation has not been validated in all clinical situations. eGFR's persistently <60 mL/min signify possible Chronic Kidney Disease.   . Anion gap 08/27/2014 8  5 - 15 Final  . Magnesium 08/27/2014 1.6* 1.7 - 2.4 mg/dL Final    Assessment:  TAMILA GAULIN is a 74 y.o. 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  She has a 80-100 pack smoking history.  She presented with a right sided lung mass on screening chest CT.  Chest CT on 06/18/2014 revealed a 5.0 x 3.8 x 4.7 cm right retrohilar mass in the superior segment of the right lower lobe. The mass was contiguous with and indents/narrows the bronchus intermedius. There was mediastinal and suspected right hilar adenopathy measuring between 1.1 and 1.9 cm. There were a few tiny nonspecific pulmonary nodules. There was an infiltrate within the superior segment of the left lower lobe suggesting pneumonia.  PET scan on 06/29/2014 revealed hypermetabolic right lower lobe pulmonary mass consistent with bronchogenic carcinoma.  There were hypermetabolic left mediastinal lymph nodes and prevascular nodes consistent with nodal metastasis.  There was a nodular consolidation within the medial left lower lobe.  There were bilateral supraclavicular node metastasis.  Bronchoscopy with biopsy on 07/08/2014 revealed extrinsic compression of the right lower lobe  secondary to mass.  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  Head CT on 07/21/2014 revealed no evidence of metastatic disease.  She began radiation on 08/18/2014.  Symptomatically, she has chronic shortness of breath. She remain on Levaquin. Her blood sugar has been elevated on Decadron (4 mg a day) prescribed on 08/17/2014.    Plan: 1. Labs today:  CBC with diff, CMP, Mg. 2. Re-review chemotherapy plan for low dose carboplatin and Taxol with radiation.  Side effects reviewed in detail.  Discuss need for Decadron and Benadryl with Taxol secondary to risk for allergic reaction. Discuss dosing of Decadron.  Taper as tolerated.  Patient adamantly stated that she would not take steroids  as she is worried about a diabetic coma.  She stated "what are you trying to do, kill me".  I discussed a sliding scale insulin to manage her blood sugar.  The patient was adamant about no steroids.  I stated that I could not give her this chemotherapy without steroids.  He son stated that if anything happened to his mother he would "come after me".  I discussed no treatment today. 3. Discuss sliding scale insulin administered through PCP.  Patient declined. 4. Discuss with oncology partner re: feasibility of carboplatin alone (unclear efficacy). 5. No chemotherapy today. 6. Continue radiation. 7.   RTC in 1 week for MD assessment, further discussions, and +/- chemotherapy.    Lequita Asal, MD  08/27/2014 , 10:06 AM

## 2014-08-30 ENCOUNTER — Ambulatory Visit
Admission: RE | Admit: 2014-08-30 | Discharge: 2014-08-30 | Disposition: A | Discharge status: Home / Self Care | Payer: PPO | Source: Ambulatory Visit | Attending: Radiation Oncology | Admitting: Radiation Oncology

## 2014-08-30 ENCOUNTER — Ambulatory Visit: Payer: PPO

## 2014-08-30 DIAGNOSIS — Z510 Encounter for antineoplastic radiation therapy: Secondary | ICD-10-CM | POA: Diagnosis not present

## 2014-08-31 ENCOUNTER — Ambulatory Visit: Payer: PPO

## 2014-08-31 ENCOUNTER — Ambulatory Visit
Admission: RE | Admit: 2014-08-31 | Discharge: 2014-08-31 | Disposition: A | Discharge status: Home / Self Care | Payer: PPO | Source: Ambulatory Visit | Attending: Radiation Oncology | Admitting: Radiation Oncology

## 2014-08-31 DIAGNOSIS — Z510 Encounter for antineoplastic radiation therapy: Secondary | ICD-10-CM | POA: Diagnosis not present

## 2014-09-01 ENCOUNTER — Ambulatory Visit: Payer: PPO

## 2014-09-01 ENCOUNTER — Ambulatory Visit
Admission: RE | Admit: 2014-09-01 | Discharge: 2014-09-01 | Disposition: A | Discharge status: Home / Self Care | Payer: PPO | Source: Ambulatory Visit | Attending: Radiation Oncology | Admitting: Radiation Oncology

## 2014-09-01 ENCOUNTER — Other Ambulatory Visit: Payer: Self-pay | Admitting: Family Medicine

## 2014-09-01 DIAGNOSIS — Z510 Encounter for antineoplastic radiation therapy: Secondary | ICD-10-CM | POA: Diagnosis not present

## 2014-09-02 ENCOUNTER — Ambulatory Visit: Payer: PPO

## 2014-09-02 ENCOUNTER — Ambulatory Visit
Admission: RE | Admit: 2014-09-02 | Discharge: 2014-09-02 | Disposition: A | Discharge status: Home / Self Care | Payer: PPO | Source: Ambulatory Visit | Attending: Radiation Oncology | Admitting: Radiation Oncology

## 2014-09-02 ENCOUNTER — Inpatient Hospital Stay: Payer: PPO | Attending: Hematology and Oncology

## 2014-09-02 ENCOUNTER — Inpatient Hospital Stay (HOSPITAL_BASED_OUTPATIENT_CLINIC_OR_DEPARTMENT_OTHER): Payer: PPO | Admitting: Hematology and Oncology

## 2014-09-02 DIAGNOSIS — C3431 Malignant neoplasm of lower lobe, right bronchus or lung: Secondary | ICD-10-CM | POA: Insufficient documentation

## 2014-09-02 DIAGNOSIS — Z79899 Other long term (current) drug therapy: Secondary | ICD-10-CM | POA: Diagnosis not present

## 2014-09-02 DIAGNOSIS — Z510 Encounter for antineoplastic radiation therapy: Secondary | ICD-10-CM | POA: Diagnosis not present

## 2014-09-02 DIAGNOSIS — F419 Anxiety disorder, unspecified: Secondary | ICD-10-CM | POA: Insufficient documentation

## 2014-09-02 DIAGNOSIS — F329 Major depressive disorder, single episode, unspecified: Secondary | ICD-10-CM | POA: Diagnosis not present

## 2014-09-02 DIAGNOSIS — J449 Chronic obstructive pulmonary disease, unspecified: Secondary | ICD-10-CM

## 2014-09-02 DIAGNOSIS — C770 Secondary and unspecified malignant neoplasm of lymph nodes of head, face and neck: Secondary | ICD-10-CM

## 2014-09-02 DIAGNOSIS — R0602 Shortness of breath: Secondary | ICD-10-CM

## 2014-09-02 DIAGNOSIS — Z7982 Long term (current) use of aspirin: Secondary | ICD-10-CM | POA: Insufficient documentation

## 2014-09-02 DIAGNOSIS — Z923 Personal history of irradiation: Secondary | ICD-10-CM | POA: Insufficient documentation

## 2014-09-02 DIAGNOSIS — Z87891 Personal history of nicotine dependence: Secondary | ICD-10-CM | POA: Insufficient documentation

## 2014-09-02 DIAGNOSIS — Z8673 Personal history of transient ischemic attack (TIA), and cerebral infarction without residual deficits: Secondary | ICD-10-CM | POA: Insufficient documentation

## 2014-09-02 DIAGNOSIS — I10 Essential (primary) hypertension: Secondary | ICD-10-CM

## 2014-09-02 DIAGNOSIS — I509 Heart failure, unspecified: Secondary | ICD-10-CM | POA: Insufficient documentation

## 2014-09-02 DIAGNOSIS — C3490 Malignant neoplasm of unspecified part of unspecified bronchus or lung: Secondary | ICD-10-CM

## 2014-09-02 DIAGNOSIS — E785 Hyperlipidemia, unspecified: Secondary | ICD-10-CM | POA: Insufficient documentation

## 2014-09-02 DIAGNOSIS — E119 Type 2 diabetes mellitus without complications: Secondary | ICD-10-CM | POA: Insufficient documentation

## 2014-09-02 DIAGNOSIS — Z5111 Encounter for antineoplastic chemotherapy: Secondary | ICD-10-CM | POA: Insufficient documentation

## 2014-09-02 LAB — COMPREHENSIVE METABOLIC PANEL
ALT: 13 U/L — ABNORMAL LOW (ref 14–54)
AST: 22 U/L (ref 15–41)
Albumin: 3.6 g/dL (ref 3.5–5.0)
Alkaline Phosphatase: 83 U/L (ref 38–126)
Anion gap: 9 (ref 5–15)
BUN: 26 mg/dL — ABNORMAL HIGH (ref 6–20)
CO2: 27 mmol/L (ref 22–32)
Calcium: 8.2 mg/dL — ABNORMAL LOW (ref 8.9–10.3)
Chloride: 95 mmol/L — ABNORMAL LOW (ref 101–111)
Creatinine, Ser: 0.64 mg/dL (ref 0.44–1.00)
GFR calc Af Amer: >60 mL/min (ref >60)
GFR calc non Af Amer: >60 mL/min (ref >60)
Glucose, Bld: 347 mg/dL — ABNORMAL HIGH (ref 65–99)
Potassium: 4.6 mmol/L (ref 3.5–5.1)
Sodium: 131 mmol/L — ABNORMAL LOW (ref 135–145)
Total Bilirubin: 1 mg/dL (ref 0.3–1.2)
Total Protein: 6.9 g/dL (ref 6.5–8.1)

## 2014-09-02 LAB — CBC WITH DIFFERENTIAL/PLATELET
Basophils Absolute: 0.1 10*3/uL (ref 0–0.1)
Basophils Relative: 1 %
Eosinophils Absolute: 0 10*3/uL (ref 0–0.7)
Eosinophils Relative: 1 %
HCT: 39.6 % (ref 35.0–47.0)
Hemoglobin: 13.2 g/dL (ref 12.0–16.0)
Lymphocytes Relative: 4 %
Lymphs Abs: 0.4 10*3/uL — ABNORMAL LOW (ref 1.0–3.6)
MCH: 28.3 pg (ref 26.0–34.0)
MCHC: 33.5 g/dL (ref 32.0–36.0)
MCV: 84.6 fL (ref 80.0–100.0)
Monocytes Absolute: 0.4 10*3/uL (ref 0.2–0.9)
Monocytes Relative: 4 %
Neutro Abs: 9 10*3/uL — ABNORMAL HIGH (ref 1.4–6.5)
Neutrophils Relative %: 90 %
Platelets: 227 10*3/uL (ref 150–440)
RBC: 4.68 MIL/uL (ref 3.80–5.20)
RDW: 14.5 % (ref 11.5–14.5)
WBC: 9.8 10*3/uL (ref 3.6–11.0)

## 2014-09-02 NOTE — Telephone Encounter (Signed)
She does not need a refill on Glimepride, Benazepril, or Metformin. She still has 1 refills remaining on each of those.

## 2014-09-02 NOTE — Progress Notes (Signed)
   09/02/14 1000  Clinical Encounter Type  Visited With Patient  Visit Type Initial  Provided presence and pastoral support to patient in the cancer center.  [place]

## 2014-09-02 NOTE — Progress Notes (Signed)
[facility] day:  09/02/2014  Chief Complaint: Sherry Mueller is a 74 y.o. female with clinical stage IIIB for assessment prior to initiation of chemotherapy with ongoing radiation therapy.  HPI: The patient was last seen in the medical oncology clinic on 08/27/2014.  At that time, she was to begin low dose carboplatin and Taxol with concurrent chemotherapy and radiation.  Chemotherapy was not given as she adamantly refused Decadron as part of her premedications secondary to issues with poorly controlled blood sugar.  She had previously been prescribed Decadron 4 mg a day.  Because of the need for Decadron as a premedication for her Taxol, chemotherapy was cancelled.  She has continued her radiation.  She has received 12 fractions of radiation to date.   Past Medical History  Diagnosis Date  . Stroke   . Hyperlipidemia   . CHF (congestive heart failure)   . Cancer   . Hypertension   . COPD (chronic obstructive pulmonary disease)   . Shortness of breath dyspnea   . Diabetes mellitus without complication   . Depression   . Anxiety   . Headache     Past Surgical History  Procedure Laterality Date  . Breast surgery Left cyst removed  . Cardiac catheterization    . Colonoscopy      Family History  Problem Relation Age of Onset  . Heart disease Mother   . Hypertension Mother   . Heart disease Father   . Stroke Father     Social History:  reports that she quit smoking about 7 years ago. Her smoking use included Cigarettes. She smoked 2.00 packs per day. She has never used smokeless tobacco. She reports that she does not drink alcohol or use illicit drugs.  The patient is accompanied by her son.  Allergies:  Allergies  Allergen Reactions  . Byetta 10 Mcg Pen [Exenatide] Nausea Only  . Hctz [Hydrochlorothiazide] Nausea Only  . Penicillins Itching  . Plavix [Clopidogrel] Nausea Only  . Plavix [Clopidogrel] Nausea And Vomiting     Current Medications: Current Outpatient Prescriptions  Medication Sig Dispense Refill  . albuterol-ipratropium (COMBIVENT) 18-103 MCG/ACT inhaler Inhale 2 puffs into the lungs every 4 (four) hours as needed for wheezing or shortness of breath.    Marland Kitchen amLODipine (NORVASC) 5 MG tablet TAKE 1 TABLET BY MOUTH EVERY DAY 30 tablet 3  . aspirin 81 MG tablet Take 81 mg by mouth 2 (two) times daily.     . B Complex Vitamins (B COMPLEX PO) Take 1 tablet by mouth every morning.    . benazepril (LOTENSIN) 40 MG tablet TAKE 1 TABLET BY MOUTH EVERY DAY 30 tablet 5  . busPIRone (BUSPAR) 10 MG tablet Take 10 mg by mouth 2 (two) times daily as needed.    . Cholecalciferol (VITAMIN D3) 2000 UNITS TABS Take 1 tablet by mouth every morning.    . citalopram (CELEXA) 40 MG tablet TAKE 1 TABLET BY MOUTH AT BEDTIME 30 tablet 2  . co-enzyme Q-10 30 MG capsule Take 30 mg by mouth daily.    Marland Kitchen dexamethasone (DECADRON) 4 MG tablet Take 1 tablet (4 mg total) by mouth daily. Take 1 tablet daily for 10 days, then take 1 tablet every other day until finished. 18 tablet 0  . fluticasone (FLONASE) 50 MCG/ACT nasal spray Place 2 sprays into both nostrils daily. 15.8 g 11  . Fluticasone-Salmeterol (ADVAIR DISKUS) 250-50 MCG/DOSE AEPB Inhale 1 puff into the lungs as  needed.    Marland Kitchen glimepiride (AMARYL) 4 MG tablet TAKE 1 TABLET BY MOUTH EVERY DAY 30 tablet 5  . glucose monitoring kit (FREESTYLE) monitoring kit 1 each by Does not apply route as needed for other.    . guaiFENesin-codeine 100-10 MG/5ML syrup Take 5 mLs by mouth 3 (three) times daily as needed for cough. 120 mL 2  . Ipratropium-Albuterol (COMBIVENT RESPIMAT) 20-100 MCG/ACT AERS respimat Inhale 1 puff into the lungs every 6 (six) hours.    . isosorbide mononitrate (IMDUR) 30 MG 24 hr tablet TAKE 1 TABLET BY MOUTH EVERY DAY 30 tablet 5  . lansoprazole (PREVACID) 30 MG capsule Take 1 capsule (30 mg total) by mouth daily at 12 noon. 30 capsule 2  . levofloxacin (LEVAQUIN)  500 MG tablet Take 1 tablet (500 mg total) by mouth daily. 10 tablet 0  . metFORMIN (GLUCOPHAGE) 1000 MG tablet TAKE 1 TABLET BY MOUTH TWICE A DAY 60 tablet 2  . metFORMIN (GLUCOPHAGE-XR) 500 MG 24 hr tablet Take 2 tablets (1,000 mg total) by mouth 2 (two) times daily. Take 2 tablets twice a day 360 tablet 1  . Multiple Vitamins-Minerals (MULTIVITAMIN PO) Take 1 tablet by mouth 2 (two) times daily.    . Omega-3 Fatty Acids (FISH OIL PO) Take 1,000 mg by mouth every morning.    . pravastatin (PRAVACHOL) 80 MG tablet TAKE 1 TABLET BY MOUTH AT BEDTIME 30 tablet 2   No current facility-administered medications for this visit.    Review of Systems:  GENERAL:  No new symptoms. No fevers or sweats.  No new weight. PERFORMANCE STATUS (ECOG):  1 HEENT:  No visual changes, sore throat, mouth sores or tenderness. Lungs: Chronic shortness of breath.  Cough.  No hemoptysis. Cardiac:  No chest pain, palpitations, orthopnea, or PND. GI:  No nausea, vomiting, diarrhea, constipation, melena or hematochezia. GU:  No urgency, frequency, dysuria, or hematuria. Musculoskeletal:  No back pain.  No joint pain.  No muscle tenderness. Extremities:  No pain or swelling. Skin:  No rashes or skin changes. Neuro:  No headache, numbness or weakness, balance or coordination issues. Endocrine:  Diabetes, poorly controlled.  No thyroid issues, hot flashes or night sweats. Psych:  No mood changes, depression or anxiety. Pain:  No focal pain. Review of systems:  All other systems reviewed and found to be negative.   Physical Exam: There were no vitals taken for this visit.  GENERAL:  Well developed, well nourished, sitting comfortably in the consult room in no acute distress. MENTAL STATUS:  Alert and oriented to person, place and time. HEAD:  Short dark brown hair.  Normocephalic, atraumatic, face symmetric, no Cushingoid features. EYES:  No conjunctivitis or scleral icterus. PSYCH:  Appropriate.   Appointment on  09/02/2014  Component Date Value Ref Range Status  . WBC 09/02/2014 9.8  3.6 - 11.0 K/uL Final  . RBC 09/02/2014 4.68  3.80 - 5.20 MIL/uL Final  . Hemoglobin 09/02/2014 13.2  12.0 - 16.0 g/dL Final  . HCT 09/02/2014 39.6  35.0 - 47.0 % Final  . MCV 09/02/2014 84.6  80.0 - 100.0 fL Final  . MCH 09/02/2014 28.3  26.0 - 34.0 pg Final  . MCHC 09/02/2014 33.5  32.0 - 36.0 g/dL Final  . RDW 09/02/2014 14.5  11.5 - 14.5 % Final  . Platelets 09/02/2014 227  150 - 440 K/uL Final  . Neutrophils Relative % 09/02/2014 90   Final  . Neutro Abs 09/02/2014 9.0* 1.4 - 6.5 K/uL  Final  . Lymphocytes Relative 09/02/2014 4   Final  . Lymphs Abs 09/02/2014 0.4* 1.0 - 3.6 K/uL Final  . Monocytes Relative 09/02/2014 4   Final  . Monocytes Absolute 09/02/2014 0.4  0.2 - 0.9 K/uL Final  . Eosinophils Relative 09/02/2014 1   Final  . Eosinophils Absolute 09/02/2014 0.0  0 - 0.7 K/uL Final  . Basophils Relative 09/02/2014 1   Final  . Basophils Absolute 09/02/2014 0.1  0 - 0.1 K/uL Final  . Sodium 09/02/2014 131* 135 - 145 mmol/L Final  . Potassium 09/02/2014 4.6  3.5 - 5.1 mmol/L Final  . Chloride 09/02/2014 95* 101 - 111 mmol/L Final  . CO2 09/02/2014 27  22 - 32 mmol/L Final  . Glucose, Bld 09/02/2014 347* 65 - 99 mg/dL Final  . BUN 09/02/2014 26* 6 - 20 mg/dL Final  . Creatinine, Ser 09/02/2014 0.64  0.44 - 1.00 mg/dL Final  . Calcium 09/02/2014 8.2* 8.9 - 10.3 mg/dL Final  . Total Protein 09/02/2014 6.9  6.5 - 8.1 g/dL Final  . Albumin 09/02/2014 3.6  3.5 - 5.0 g/dL Final  . AST 09/02/2014 22  15 - 41 U/L Final  . ALT 09/02/2014 13* 14 - 54 U/L Final  . Alkaline Phosphatase 09/02/2014 83  38 - 126 U/L Final  . Total Bilirubin 09/02/2014 1.0  0.3 - 1.2 mg/dL Final  . GFR calc non Af Amer 09/02/2014 >60  >60 mL/min Final  . GFR calc Af Amer 09/02/2014 >60  >60 mL/min Final   Comment: (NOTE) The eGFR has been calculated using the CKD EPI equation. This calculation has not been validated in all clinical  situations. eGFR's persistently <60 mL/min signify possible Chronic Kidney Disease.   . Anion gap 09/02/2014 9  5 - 15 Final    Assessment:  NGA RABON is a 74 y.o. 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  She has a 80-100 pack smoking history.  She presented with a right sided lung mass on screening chest CT.  Chest CT on 06/18/2014 revealed a 5.0 x 3.8 x 4.7 cm right retrohilar mass in the superior segment of the right lower lobe. The mass was contiguous with and indents/narrows the bronchus intermedius. There was mediastinal and suspected right hilar adenopathy measuring between 1.1 and 1.9 cm. There were a few tiny nonspecific pulmonary nodules. There was an infiltrate within the superior segment of the left lower lobe suggesting pneumonia.  PET scan on 06/29/2014 revealed hypermetabolic right lower lobe pulmonary mass consistent with bronchogenic carcinoma.  There were hypermetabolic left mediastinal lymph nodes and prevascular nodes consistent with nodal metastasis.  There was a nodular consolidation within the medial left lower lobe.  There were bilateral supraclavicular node metastasis.  Bronchoscopy with biopsy on 07/08/2014 revealed extrinsic compression of the right lower lobe secondary to mass.  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  Head CT on 07/21/2014 revealed no evidence of metastatic disease. She began radiation on 08/18/2014.  Symptomatically, she has chronic shortness of breath.  She is adamant that she will not receive any steroids with treatment.  Plan: 1.  Labs today:  CBC with diff, CMP. 2.  Re-discuss need for steroids with low dose carboplatin and Taxol secondary to potential allergic reaction with Taxol.  Discuss dose of Decadron and taper as tolerated.  Discuss transient hyperglycemia typically well managed with sliding scale insulin.  Patient stated that she will not take anything steroids.  Discuss  lack of efficacy with carboplatin alone.  Discuss documented benefit of radiation with planned chemotherapy.  Patient uninterested in treatment.  I suggested second opinion with Dr. Oliva Bustard, medical director of cancer center, or another oncologist.  Patient in agreement.  She would like to transfer care to Dr. Oliva Bustard.  This will be arranged for next week. 3.  Continue radiation. 4.  No chemotherapy today. 5.  RTC on 09/09/2014 for MD assessment (Dr. Oliva Bustard) and +/- labs and chemotherapy.    Lequita Asal, MD  09/02/2014

## 2014-09-03 ENCOUNTER — Inpatient Hospital Stay: Payer: PPO

## 2014-09-03 ENCOUNTER — Telehealth: Payer: Self-pay

## 2014-09-03 ENCOUNTER — Ambulatory Visit: Payer: PPO

## 2014-09-03 ENCOUNTER — Ambulatory Visit
Admission: RE | Admit: 2014-09-03 | Discharge: 2014-09-03 | Disposition: A | Discharge status: Home / Self Care | Payer: PPO | Source: Ambulatory Visit | Attending: Radiation Oncology | Admitting: Radiation Oncology

## 2014-09-03 DIAGNOSIS — Z510 Encounter for antineoplastic radiation therapy: Secondary | ICD-10-CM | POA: Diagnosis not present

## 2014-09-03 NOTE — Telephone Encounter (Signed)
She is starting back on chemo and they are going to be giving her steroids (Dexamethasone) along with it. She knows it is going to run her glucose way up. She told her oncology team that she was worried about her sugars and going into a diabetic coma. She said they acted like it was no big deal. She said the last time they gave her Dexamethasone , her sugars were in the 400s. She would like to speak with you to see if there is something she can take to help keep her sugars from skyrocketing from it. She stated that you did not have to call her back this afternoon, she does not go back to them until Thursday.

## 2014-09-05 ENCOUNTER — Encounter: Payer: Self-pay | Admitting: Hematology and Oncology

## 2014-09-07 ENCOUNTER — Ambulatory Visit: Payer: PPO

## 2014-09-07 ENCOUNTER — Ambulatory Visit
Admission: RE | Admit: 2014-09-07 | Discharge: 2014-09-07 | Disposition: A | Discharge status: Home / Self Care | Payer: PPO | Source: Ambulatory Visit | Attending: Radiation Oncology | Admitting: Radiation Oncology

## 2014-09-07 DIAGNOSIS — Z510 Encounter for antineoplastic radiation therapy: Secondary | ICD-10-CM | POA: Diagnosis not present

## 2014-09-07 MED ORDER — METFORMIN HCL ER 500 MG PO TB24: 1000.0000 mg | ORAL_TABLET | Freq: Two times a day (BID) | ORAL | Status: DC

## 2014-09-07 MED ORDER — INSULIN PEN NEEDLE 31G X 5 MM MISC: Status: DC

## 2014-09-07 MED ORDER — INSULIN DETEMIR 100 UNIT/ML FLEXPEN: 10.0000 [IU] | PEN_INJECTOR | Freq: Every day | SUBCUTANEOUS | Status: DC

## 2014-09-07 NOTE — Telephone Encounter (Signed)
I spoke with patient; radiation has made her weak and she has some sore throat Hanging in there; done with 13th session She sees a new doctor on Thursday and then will start chemo She's going to be on steroids in addition and is concerned about what that will do with sugars

## 2014-09-08 ENCOUNTER — Ambulatory Visit: Payer: PPO

## 2014-09-08 ENCOUNTER — Ambulatory Visit
Admission: RE | Admit: 2014-09-08 | Discharge: 2014-09-08 | Disposition: A | Discharge status: Home / Self Care | Payer: PPO | Source: Ambulatory Visit | Attending: Radiation Oncology | Admitting: Radiation Oncology

## 2014-09-08 DIAGNOSIS — Z510 Encounter for antineoplastic radiation therapy: Secondary | ICD-10-CM | POA: Diagnosis not present

## 2014-09-09 ENCOUNTER — Ambulatory Visit
Admission: RE | Admit: 2014-09-09 | Discharge: 2014-09-09 | Disposition: A | Discharge status: Home / Self Care | Payer: PPO | Source: Ambulatory Visit | Attending: Radiation Oncology | Admitting: Radiation Oncology

## 2014-09-09 ENCOUNTER — Inpatient Hospital Stay: Payer: PPO

## 2014-09-09 ENCOUNTER — Ambulatory Visit: Payer: PPO

## 2014-09-09 ENCOUNTER — Inpatient Hospital Stay (HOSPITAL_BASED_OUTPATIENT_CLINIC_OR_DEPARTMENT_OTHER): Payer: PPO | Admitting: Oncology

## 2014-09-09 VITALS — BP 110/69 | HR 108 | Temp 97.4°F | Wt 276.1 lb

## 2014-09-09 DIAGNOSIS — E785 Hyperlipidemia, unspecified: Secondary | ICD-10-CM

## 2014-09-09 DIAGNOSIS — I509 Heart failure, unspecified: Secondary | ICD-10-CM

## 2014-09-09 DIAGNOSIS — C770 Secondary and unspecified malignant neoplasm of lymph nodes of head, face and neck: Secondary | ICD-10-CM

## 2014-09-09 DIAGNOSIS — C3431 Malignant neoplasm of lower lobe, right bronchus or lung: Secondary | ICD-10-CM

## 2014-09-09 DIAGNOSIS — I10 Essential (primary) hypertension: Secondary | ICD-10-CM

## 2014-09-09 DIAGNOSIS — Z923 Personal history of irradiation: Secondary | ICD-10-CM

## 2014-09-09 DIAGNOSIS — Z8673 Personal history of transient ischemic attack (TIA), and cerebral infarction without residual deficits: Secondary | ICD-10-CM

## 2014-09-09 DIAGNOSIS — Z7982 Long term (current) use of aspirin: Secondary | ICD-10-CM | POA: Diagnosis not present

## 2014-09-09 DIAGNOSIS — F329 Major depressive disorder, single episode, unspecified: Secondary | ICD-10-CM

## 2014-09-09 DIAGNOSIS — Z87891 Personal history of nicotine dependence: Secondary | ICD-10-CM

## 2014-09-09 DIAGNOSIS — C3490 Malignant neoplasm of unspecified part of unspecified bronchus or lung: Secondary | ICD-10-CM

## 2014-09-09 DIAGNOSIS — E119 Type 2 diabetes mellitus without complications: Secondary | ICD-10-CM

## 2014-09-09 DIAGNOSIS — F419 Anxiety disorder, unspecified: Secondary | ICD-10-CM

## 2014-09-09 DIAGNOSIS — Z79899 Other long term (current) drug therapy: Secondary | ICD-10-CM

## 2014-09-09 DIAGNOSIS — R0602 Shortness of breath: Secondary | ICD-10-CM

## 2014-09-09 DIAGNOSIS — Z510 Encounter for antineoplastic radiation therapy: Secondary | ICD-10-CM | POA: Diagnosis not present

## 2014-09-09 DIAGNOSIS — J449 Chronic obstructive pulmonary disease, unspecified: Secondary | ICD-10-CM

## 2014-09-09 DIAGNOSIS — Z5111 Encounter for antineoplastic chemotherapy: Secondary | ICD-10-CM | POA: Diagnosis not present

## 2014-09-09 LAB — CBC WITH DIFFERENTIAL/PLATELET
Basophils Absolute: 0.1 10*3/uL (ref 0–0.1)
Basophils Relative: 1 %
Eosinophils Absolute: 0.1 10*3/uL (ref 0–0.7)
Eosinophils Relative: 1 %
HCT: 40.2 % (ref 35.0–47.0)
Hemoglobin: 13.3 g/dL (ref 12.0–16.0)
Lymphocytes Relative: 7 %
Lymphs Abs: 0.6 10*3/uL — ABNORMAL LOW (ref 1.0–3.6)
MCH: 28.2 pg (ref 26.0–34.0)
MCHC: 33 g/dL (ref 32.0–36.0)
MCV: 85.5 fL (ref 80.0–100.0)
Monocytes Absolute: 0.7 10*3/uL (ref 0.2–0.9)
Monocytes Relative: 7 %
Neutro Abs: 7.7 10*3/uL — ABNORMAL HIGH (ref 1.4–6.5)
Neutrophils Relative %: 84 %
Platelets: 227 10*3/uL (ref 150–440)
RBC: 4.7 MIL/uL (ref 3.80–5.20)
RDW: 14.5 % (ref 11.5–14.5)
WBC: 9.2 10*3/uL (ref 3.6–11.0)

## 2014-09-09 LAB — COMPREHENSIVE METABOLIC PANEL
ALT: 13 U/L — ABNORMAL LOW (ref 14–54)
AST: 15 U/L (ref 15–41)
Albumin: 3.6 g/dL (ref 3.5–5.0)
Alkaline Phosphatase: 88 U/L (ref 38–126)
Anion gap: 7 (ref 5–15)
BUN: 14 mg/dL (ref 6–20)
CO2: 32 mmol/L (ref 22–32)
Calcium: 8.6 mg/dL — ABNORMAL LOW (ref 8.9–10.3)
Chloride: 93 mmol/L — ABNORMAL LOW (ref 101–111)
Creatinine, Ser: 0.61 mg/dL (ref 0.44–1.00)
GFR calc Af Amer: >60 mL/min (ref >60)
GFR calc non Af Amer: >60 mL/min (ref >60)
Glucose, Bld: 261 mg/dL — ABNORMAL HIGH (ref 65–99)
Potassium: 4 mmol/L (ref 3.5–5.1)
Sodium: 132 mmol/L — ABNORMAL LOW (ref 135–145)
Total Bilirubin: 0.9 mg/dL (ref 0.3–1.2)
Total Protein: 7 g/dL (ref 6.5–8.1)

## 2014-09-09 MED ORDER — ONDANSETRON HCL 4 MG PO TABS: 4.0000 mg | ORAL_TABLET | Freq: Four times a day (QID) | ORAL | Status: DC | PRN

## 2014-09-09 NOTE — Progress Notes (Signed)
Former smoker. Patient does not have a living will.

## 2014-09-10 ENCOUNTER — Encounter: Payer: Self-pay | Admitting: Oncology

## 2014-09-10 ENCOUNTER — Ambulatory Visit
Admission: RE | Admit: 2014-09-10 | Discharge: 2014-09-10 | Disposition: A | Discharge status: Home / Self Care | Payer: PPO | Source: Ambulatory Visit | Attending: Radiation Oncology | Admitting: Radiation Oncology

## 2014-09-10 ENCOUNTER — Ambulatory Visit: Payer: PPO

## 2014-09-10 ENCOUNTER — Inpatient Hospital Stay: Payer: PPO

## 2014-09-10 DIAGNOSIS — C3490 Malignant neoplasm of unspecified part of unspecified bronchus or lung: Secondary | ICD-10-CM

## 2014-09-10 DIAGNOSIS — Z510 Encounter for antineoplastic radiation therapy: Secondary | ICD-10-CM | POA: Diagnosis not present

## 2014-09-10 DIAGNOSIS — Z5111 Encounter for antineoplastic chemotherapy: Secondary | ICD-10-CM | POA: Diagnosis not present

## 2014-09-10 LAB — MAGNESIUM: MAGNESIUM: 1.6 mg/dL — AB (ref 1.7–2.4)

## 2014-09-10 MED ORDER — SODIUM CHLORIDE 0.9 % IV SOLN
250.0000 mg | Freq: Once | INTRAVENOUS | Status: AC
  Administered 2014-09-10: 250 mg via INTRAVENOUS
  Filled 2014-09-10: qty 25

## 2014-09-10 MED ORDER — SODIUM CHLORIDE 0.9 % IV SOLN
Freq: Once | INTRAVENOUS | Status: AC
  Administered 2014-09-10: 10:00:00 via INTRAVENOUS
  Filled 2014-09-10: qty 8

## 2014-09-10 MED ORDER — DIPHENHYDRAMINE HCL 50 MG/ML IJ SOLN
50.0000 mg | Freq: Once | INTRAMUSCULAR | Status: AC
  Administered 2014-09-10: 50 mg via INTRAVENOUS
  Filled 2014-09-10: qty 1

## 2014-09-10 MED ORDER — SODIUM CHLORIDE 0.9 % IV SOLN
Freq: Once | INTRAVENOUS | Status: AC
  Administered 2014-09-10: 10:00:00 via INTRAVENOUS
  Filled 2014-09-10: qty 1000

## 2014-09-10 MED ORDER — FAMOTIDINE IN NACL 20-0.9 MG/50ML-% IV SOLN
20.0000 mg | Freq: Once | INTRAVENOUS | Status: AC
Start: 2014-09-10 — End: 2014-09-10
  Administered 2014-09-10: 20 mg via INTRAVENOUS
  Filled 2014-09-10: qty 50

## 2014-09-10 NOTE — Progress Notes (Signed)
Sherry Mueller (MR# [id number])      Progress Notes Info    Author Note Status Last Update User Last Update Date/Time   Lequita Asal, MD Signed Lequita Asal, MD 09/05/2014 4:37 PM    Progress Notes    Expand All Collapse All   [facility] day: 09/02/2014  Chief Complaint: Sherry Mueller is a 74 y.o. female with clinical stage IIIB for assessment prior to initiation of chemotherapy with ongoing radiation therapy.  HPI: The patient was last seen in the medical oncology clinic on 08/27/2014. At that time, she was to begin low dose carboplatin and Taxol with concurrent chemotherapy and radiation. Chemotherapy was not given as she adamantly refused Decadron as part of her premedications secondary to issues with poorly controlled blood sugar. She had previously been prescribed Decadron 4 mg a day. Because of the need for Decadron as a premedication for her Taxol, chemotherapy was cancelled.   She has continued her radiation. She has received 12 fractions of radiation to date. . September 09, 2014 Patient t is here for ongoing evaluation regarding stage IIIB carcinoma of lung.. Patient is being evaluated at the request of Dr. Mike Gip.Marland Kitchen  No chills.  No fever.  Patient refuses to take any steroid.  Chemotherapy was being held because Taxol cannot be given without steroid.  No nausea no vomiting patient is continuing radiation therapy with cyst tolerating very well.  She is extremely anxious and agitated.  No chest pain.  Past Medical History  Diagnosis Date  . Stroke   . Hyperlipidemia   . CHF (congestive heart failure)   . Cancer   . Hypertension   . COPD (chronic obstructive pulmonary disease)   . Shortness of breath dyspnea   . Diabetes mellitus without complication   . Depression   . Anxiety   . Headache     Past Surgical History  Procedure Laterality Date  . Breast  surgery Left cyst removed  . Cardiac catheterization    . Colonoscopy      Family History  Problem Relation Age of Onset  . Heart disease Mother   . Hypertension Mother   . Heart disease Father   . Stroke Father     Social History:  reports that she quit smoking about 7 years ago. Her smoking use included Cigarettes. She smoked 2.00 packs per day. She has never used smokeless tobacco. She reports that she does not drink alcohol or use illicit drugs. The patient is accompanied by her son.  Allergies:  Allergies  Allergen Reactions  . Byetta 10 Mcg Pen [Exenatide] Nausea Only  . Hctz [Hydrochlorothiazide] Nausea Only  . Penicillins Itching  . Plavix [Clopidogrel] Nausea Only  . Plavix [Clopidogrel] Nausea And Vomiting    Current Medications: Current Outpatient Prescriptions  Medication Sig Dispense Refill  . albuterol-ipratropium (COMBIVENT) 18-103 MCG/ACT inhaler Inhale 2 puffs into the lungs every 4 (four) hours as needed for wheezing or shortness of breath.    Marland Kitchen amLODipine (NORVASC) 5 MG tablet TAKE 1 TABLET BY MOUTH EVERY DAY 30 tablet 3  . aspirin 81 MG tablet Take 81 mg by mouth 2 (two) times daily.     . B Complex Vitamins (B COMPLEX PO) Take 1 tablet by mouth every morning.    . benazepril (LOTENSIN) 40 MG tablet TAKE 1 TABLET BY MOUTH EVERY DAY 30 tablet 5  . busPIRone (BUSPAR) 10 MG tablet Take 10 mg by mouth 2 (  two) times daily as needed.    . Cholecalciferol (VITAMIN D3) 2000 UNITS TABS Take 1 tablet by mouth every morning.    . citalopram (CELEXA) 40 MG tablet TAKE 1 TABLET BY MOUTH AT BEDTIME 30 tablet 2  . co-enzyme Q-10 30 MG capsule Take 30 mg by mouth daily.    Marland Kitchen  other possibility if insurance approves that    0  . fluticasone (FLONASE) 50 MCG/ACT nasal spray Place 2 sprays into both nostrils daily. 15.8 g 11  . Fluticasone-Salmeterol (ADVAIR DISKUS)  250-50 MCG/DOSE AEPB Inhale 1 puff into the lungs as needed.    Marland Kitchen glimepiride (AMARYL) 4 MG tablet TAKE 1 TABLET BY MOUTH EVERY DAY 30 tablet 5  . glucose monitoring kit (FREESTYLE) monitoring kit 1 each by Does not apply route as needed for other.    . guaiFENesin-codeine 100-10 MG/5ML syrup Take 5 mLs by mouth 3 (three) times daily as needed for cough. 120 mL 2  . Ipratropium-Albuterol (COMBIVENT RESPIMAT) 20-100 MCG/ACT AERS respimat Inhale 1 puff into the lungs every 6 (six) hours.    . isosorbide mononitrate (IMDUR) 30 MG 24 hr tablet TAKE 1 TABLET BY MOUTH EVERY DAY 30 tablet 5  . lansoprazole (PREVACID) 30 MG capsule Take 1 capsule (30 mg total) by mouth daily at 12 noon. 30 capsule 2  . levofloxacin (LEVAQUIN) 500 MG tablet Take 1 tablet (500 mg total) by mouth daily. 10 tablet 0  . metFORMIN (GLUCOPHAGE) 1000 MG tablet TAKE 1 TABLET BY MOUTH TWICE A DAY 60 tablet 2  . metFORMIN (GLUCOPHAGE-XR) 500 MG 24 hr tablet Take 2 tablets (1,000 mg total) by mouth 2 (two) times daily. Take 2 tablets twice a day 360 tablet 1  . Multiple Vitamins-Minerals (MULTIVITAMIN PO) Take 1 tablet by mouth 2 (two) times daily.    . Omega-3 Fatty Acids (FISH OIL PO) Take 1,000 mg by mouth every morning.    . pravastatin (PRAVACHOL) 80 MG tablet TAKE 1 TABLET BY MOUTH AT BEDTIME 30 tablet 2   No current facility-administered medications for this visit.    Review of Systems:  GENERAL: No new symptoms. No fevers or sweats. No new weight. PERFORMANCE STATUS (ECOG): 1 HEENT: No visual changes, sore throat, mouth sores or tenderness. Lungs: Chronic shortness of breath. Cough. No hemoptysis. Cardiac: No chest pain, palpitations, orthopnea, or PND. GI: No nausea, vomiting, diarrhea, constipation, melena or hematochezia. GU: No urgency, frequency, dysuria, or hematuria. Musculoskeletal: No back pain. No joint pain. No muscle  tenderness. Extremities: No pain or swelling. Skin: No rashes or skin changes. Neuro: No headache, numbness or weakness, balance or coordination issues. Endocrine: Diabetes, poorly controlled. No thyroid issues, hot flashes or night sweats. Psych: No mood changes, depression or anxiety. Pain: No focal pain. Review of systems: All other systems reviewed and found to be negative.  Physical Exam: There were no vitals taken for this visit.  GENERAL: Well developed, well nourished, sitting comfortably in the consult room in no acute distress. MENTAL STATUS: Alert and oriented to person, place and time. HEAD: Short dark brown hair. Normocephalic, atraumatic, face symmetric, no Cushingoid features. EYES: No conjunctivitis or scleral icterus. PSYCH: Appropriate.   Appointment on 09/02/2014  Component Date Value Ref Range Status  . WBC 09/02/2014 9.8  3.6 - 11.0 K/uL Final  . RBC 09/02/2014 4.68  3.80 - 5.20 MIL/uL Final  . Hemoglobin 09/02/2014 13.2  12.0 - 16.0 g/dL Final  . HCT 09/02/2014 39.6  35.0 - 47.0 % Final  .  MCV 09/02/2014 84.6  80.0 - 100.0 fL Final  . MCH 09/02/2014 28.3  26.0 - 34.0 pg Final  . MCHC 09/02/2014 33.5  32.0 - 36.0 g/dL Final  . RDW 09/02/2014 14.5  11.5 - 14.5 % Final  . Platelets 09/02/2014 227  150 - 440 K/uL Final  . Neutrophils Relative % 09/02/2014 90   Final  . Neutro Abs 09/02/2014 9.0* 1.4 - 6.5 K/uL Final  . Lymphocytes Relative 09/02/2014 4   Final  . Lymphs Abs 09/02/2014 0.4* 1.0 - 3.6 K/uL Final  . Monocytes Relative 09/02/2014 4   Final  . Monocytes Absolute 09/02/2014 0.4  0.2 - 0.9 K/uL Final  . Eosinophils Relative 09/02/2014 1   Final  . Eosinophils Absolute 09/02/2014 0.0  0 - 0.7 K/uL Final  . Basophils Relative 09/02/2014 1   Final  . Basophils Absolute 09/02/2014 0.1  0 - 0.1 K/uL Final  . Sodium  09/02/2014 131* 135 - 145 mmol/L Final  . Potassium 09/02/2014 4.6  3.5 - 5.1 mmol/L Final  . Chloride 09/02/2014 95* 101 - 111 mmol/L Final  . CO2 09/02/2014 27  22 - 32 mmol/L Final  . Glucose, Bld 09/02/2014 347* 65 - 99 mg/dL Final  . BUN 09/02/2014 26* 6 - 20 mg/dL Final  . Creatinine, Ser 09/02/2014 0.64  0.44 - 1.00 mg/dL Final  . Calcium 09/02/2014 8.2* 8.9 - 10.3 mg/dL Final  . Total Protein 09/02/2014 6.9  6.5 - 8.1 g/dL Final  . Albumin 09/02/2014 3.6  3.5 - 5.0 g/dL Final  . AST 09/02/2014 22  15 - 41 U/L Final  . ALT 09/02/2014 13* 14 - 54 U/L Final  . Alkaline Phosphatase 09/02/2014 83  38 - 126 U/L Final  . Total Bilirubin 09/02/2014 1.0  0.3 - 1.2 mg/dL Final  . GFR calc non Af Amer 09/02/2014 >60  >60 mL/min Final  . GFR calc Af Amer 09/02/2014 >60  >60 mL/min Final   Comment: (NOTE) The eGFR has been calculated using the CKD EPI equation. This calculation has not been validated in all clinical situations. eGFR's persistently <60 mL/min signify possible Chronic Kidney Disease.   . Anion gap 09/02/2014 9  5 - 15 Final    Assessment: SHILAH HEFEL is a 74 y.o. 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 She has a 80-100 pack smoking history. She presented with a right sided lung mass on screening chest CT. Chest CT on 06/18/2014 revealed a 5.0 x 3.8 x 4.7 cm right retrohilar mass in the superior segment of the right lower lobe. The mass was contiguous with and indents/narrows the bronchus intermedius. There was mediastinal and suspected right hilar adenopathy measuring between 1.1 and 1.9 cm. There were a few tiny nonspecific pulmonary nodules. There was an infiltrate within the superior segment of the left lower lobe suggesting pneumonia.  PET scan on 06/29/2014 revealed hypermetabolic right lower lobe pulmonary mass consistent with bronchogenic  carcinoma. There were hypermetabolic left mediastinal lymph nodes and prevascular nodes consistent with nodal metastasis. There was a nodular consolidation within the medial left lower lobe. There were bilateral supraclavicular node metastasis.  Bronchoscopy with biopsy on 07/08/2014 revealed extrinsic compression of the right lower lobe secondary to mass. 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  Head CT on 07/21/2014 revealed no evidence of metastatic disease. She began radiation on 08/18/2014.  Symptomatically, she has chronic shortness of breath. She is adamant that she will not receive any  steroids with treatment.  Plan: All lab data has been reviewed.   I had prolonged discussion regarding standard therapy of carboplatin and Taxol and need for steroid given it can be using a small dose.  Blood sugar can be controlled by monitoring sugar label as well as using insulin.   Usually high blood sugar and liver last only for 48 hours after use of steroid that has been explained.   parient adamantly opposed to any use of steroid   2.  I explained to the patient and we had to possibility of using either cis-platinum or carboplatinum along with radiation therapy which is not the ideal treatment or Considering the use of Abraxane if insurance company approves that. The patient desires to go with carboplatinum alone or carboplatinum and Abraxane in combination but will not take steroid carboplatinum with AUC of 2 and Abraxane with 50 mg/m pending insurance approval along with radiation therapy patient will be reevaluated after second or third cycle of chemotherapy by me or Dr.Corcoran.Total duration of visit was 30 minutes.  50% or more time was spent in counseling patient and family regarding prognosis and options of treatment and available resources l

## 2014-09-13 ENCOUNTER — Ambulatory Visit: Payer: PPO

## 2014-09-13 ENCOUNTER — Ambulatory Visit
Admission: RE | Admit: 2014-09-13 | Discharge: 2014-09-13 | Disposition: A | Discharge status: Home / Self Care | Payer: PPO | Source: Ambulatory Visit | Attending: Radiation Oncology | Admitting: Radiation Oncology

## 2014-09-13 DIAGNOSIS — Z510 Encounter for antineoplastic radiation therapy: Secondary | ICD-10-CM | POA: Diagnosis not present

## 2014-09-14 ENCOUNTER — Ambulatory Visit: Payer: PPO

## 2014-09-14 ENCOUNTER — Ambulatory Visit
Admission: RE | Admit: 2014-09-14 | Discharge: 2014-09-14 | Disposition: A | Discharge status: Home / Self Care | Payer: PPO | Source: Ambulatory Visit | Attending: Radiation Oncology | Admitting: Radiation Oncology

## 2014-09-15 ENCOUNTER — Ambulatory Visit: Payer: PPO

## 2014-09-15 ENCOUNTER — Ambulatory Visit
Admission: RE | Admit: 2014-09-15 | Discharge: 2014-09-15 | Disposition: A | Discharge status: Home / Self Care | Payer: PPO | Source: Ambulatory Visit | Attending: Radiation Oncology | Admitting: Radiation Oncology

## 2014-09-15 ENCOUNTER — Ambulatory Visit: Payer: PPO | Admitting: Family Medicine

## 2014-09-15 DIAGNOSIS — Z510 Encounter for antineoplastic radiation therapy: Secondary | ICD-10-CM | POA: Diagnosis not present

## 2014-09-16 ENCOUNTER — Encounter: Payer: Self-pay | Admitting: Family Medicine

## 2014-09-16 ENCOUNTER — Ambulatory Visit: Payer: PPO

## 2014-09-16 ENCOUNTER — Ambulatory Visit (INDEPENDENT_AMBULATORY_CARE_PROVIDER_SITE_OTHER): Payer: PPO | Admitting: Family Medicine

## 2014-09-16 ENCOUNTER — Ambulatory Visit
Admission: RE | Admit: 2014-09-16 | Discharge: 2014-09-16 | Disposition: A | Discharge status: Home / Self Care | Payer: PPO | Source: Ambulatory Visit | Attending: Radiation Oncology | Admitting: Radiation Oncology

## 2014-09-16 VITALS — BP 100/66 | HR 114 | Temp 98.2°F | Ht 66.2 in | Wt 275.8 lb

## 2014-09-16 DIAGNOSIS — R0902 Hypoxemia: Secondary | ICD-10-CM

## 2014-09-16 DIAGNOSIS — E785 Hyperlipidemia, unspecified: Secondary | ICD-10-CM | POA: Diagnosis not present

## 2014-09-16 DIAGNOSIS — E1165 Type 2 diabetes mellitus with hyperglycemia: Secondary | ICD-10-CM | POA: Diagnosis not present

## 2014-09-16 DIAGNOSIS — I10 Essential (primary) hypertension: Secondary | ICD-10-CM | POA: Diagnosis not present

## 2014-09-16 DIAGNOSIS — IMO0002 Reserved for concepts with insufficient information to code with codable children: Secondary | ICD-10-CM

## 2014-09-16 DIAGNOSIS — C3491 Malignant neoplasm of unspecified part of right bronchus or lung: Secondary | ICD-10-CM

## 2014-09-16 DIAGNOSIS — Z510 Encounter for antineoplastic radiation therapy: Secondary | ICD-10-CM | POA: Diagnosis not present

## 2014-09-16 DIAGNOSIS — K760 Fatty (change of) liver, not elsewhere classified: Secondary | ICD-10-CM

## 2014-09-16 MED ORDER — BENAZEPRIL HCL 20 MG PO TABS: 20.0000 mg | ORAL_TABLET | Freq: Every day | ORAL | Status: DC

## 2014-09-16 NOTE — Assessment & Plan Note (Signed)
cmp ordered by oncologist arleady

## 2014-09-16 NOTE — Assessment & Plan Note (Signed)
Wear 2 liters/minute [place] around-the-clock; ordering through [facility]

## 2014-09-16 NOTE — Assessment & Plan Note (Signed)
Blood sugars up due to chemo therapy, steroids; monitor; check A1C; foot exam today by MD; eye exam yearly; limit sugars

## 2014-09-16 NOTE — Assessment & Plan Note (Signed)
Continue treatments with oncologist; aware that treatments are increasing blood sugars, causing fatigue

## 2014-09-16 NOTE — Progress Notes (Signed)
BP 100/66 mmHg  Pulse 114  Temp(Src) 98.2 F (36.8 C)  Ht 5' 6.2" (1.681 m)  Wt 275 lb 12.8 oz (125.102 kg)  BMI 44.27 kg/m2  SpO2 86%   Subjective:    Patient ID: Sherry Mueller, female    DOB: 12-Aug-1940, 74 y.o.   MRN: [id number]  HPI: Sherry Mueller is a 74 y.o. female  Chief Complaint  Patient presents with  . Diabetes  . Hyperlipidemia  . Hypertension   She has lung cancer She has 11 more radiation and 5 more chemo treatments She has finished 19 radiation treatments already She is now seeing Dr. Paulino Door (oncologist) Energy level is poor; breathing is a little low; using oxygen at home; she only uses at home at night;   She is on steroids, which is driving her sugars up (type 2 diabetes); FSBS this morning 240; she got up to 451 one day; she told her oncologist and has since switched oncologist  BLood pressure is low today; patient does not check BP at home; felt weaky; feels dizzy and light-headed upon standing  Cholesterol; tries to eat a decent diet; they want her to eat plenty of calories for her treatments  Relevant past medical, surgical, family and social history reviewed and updated as indicated. Interim medical history since our last visit reviewed. Allergies and medications reviewed and updated.  Review of Systems  HENT: Negative for nosebleeds.   Respiratory: Positive for shortness of breath and wheezing.   Cardiovascular: Negative for chest pain.  Gastrointestinal: Negative for blood in stool.  Endocrine: Positive for polydipsia (with high sugars on steroids).  Genitourinary: Negative for hematuria.  Hematological: Does not bruise/bleed easily.   Per HPI unless specifically indicated above     Objective:    BP 100/66 mmHg  Pulse 114  Temp(Src) 98.2 F (36.8 C)  Ht 5' 6.2" (1.681 m)  Wt 275 lb 12.8 oz (125.102 kg)  BMI 44.27 kg/m2  SpO2 86%  Wt Readings from Last 3 Encounters:  09/16/14 275 lb 12.8 oz (125.102 kg)  09/09/14 276 lb 2 oz  (125.249 kg)  08/27/14 277 lb 9 oz (125.9 kg)    Physical Exam  Constitutional: She appears well-developed and well-nourished. No distress.  Morbidly obese, weight stable  HENT:  Head: Normocephalic and atraumatic.  Right Ear: External ear and ear canal normal. Tympanic membrane is scarred. No middle ear effusion.  Mouth/Throat: Oropharynx is clear and moist and mucous membranes are normal.  Eyes: EOM are normal. No scleral icterus.  Neck: No thyromegaly present.  Cardiovascular: Normal rate, regular rhythm and normal heart sounds.   No murmur heard. Pulmonary/Chest: Effort normal and breath sounds normal. No respiratory distress. She has no wheezes.  Abdominal: Soft. Bowel sounds are normal. She exhibits no distension.  Musculoskeletal: Normal range of motion. She exhibits no edema.  Neurological: She is alert. She exhibits normal muscle tone.  Skin: Skin is warm and dry. She is not diaphoretic. No pallor.  Psychiatric: She has a normal mood and affect. Her behavior is normal. Judgment and thought content normal.   Diabetic Foot Form - Detailed   Diabetic Foot Exam - detailed  Diabetic Foot exam was performed with the following findings:  Yes 09/16/2014  9:38 AM  Visual Foot Exam completed.:  Yes  Is there a history of foot ulcer?:  No  Can the patient see the bottom of their feet?:  No  Are the toenails long?:  No  Are the toenails  thick?:  No  Are the toenails ingrown?:  No    Pulse Foot Exam completed.:  Yes  Right Dorsalis Pedis:  Present Left Dorsalis Pedis:  Present  Sensory Foot Exam Completed.:  Yes  Semmes-Weinstein Monofilament Test  R Site 1-Great Toe:  Pos L Site 1-Great Toe:  Pos  R Site 4:  Pos L Site 4:  Pos  R Site 5:  Pos L Site 5:  Pos        Results for orders placed or performed in visit on 09/10/14  Magnesium  Result Value Ref Range   Magnesium 1.6 (L) 1.7 - 2.4 mg/dL      Assessment & Plan:   Problem List Items Addressed This Visit       Cardiovascular and Mediastinum   Benign hypertension    Low today, with low energy; decrease ACE-I      Relevant Medications   benazepril (LOTENSIN) 20 MG tablet     Respiratory   Squamous cell lung cancer    Continue treatments with oncologist; aware that treatments are increasing blood sugars, causing fatigue        Digestive   Fatty liver    cmp ordered by oncologist arleady        Other   Diabetes mellitus type 2, uncontrolled - Primary    Blood sugars up due to chemo therapy, steroids; monitor; check A1C; foot exam today by MD; eye exam yearly; limit sugars      Relevant Medications   benazepril (LOTENSIN) 20 MG tablet   Other Relevant Orders   Hgb A1c w/o eAG   Hyperlipidemia    Last lipid panel in June looked great; not due again until December      Relevant Medications   benazepril (LOTENSIN) 20 MG tablet   Hypoxemia    Wear 2 liters/minute [place] around-the-clock; ordering through [facility]      Relevant Orders   For home use only DME oxygen      Follow up plan: Return in about 3 months (around 12/16/2014) for diabetes, etc..  An after-visit summary was printed and given to the patient at [facility].  Please see the patient instructions which may contain other information and recommendations beyond what is mentioned above in the assessment and plan. Orders Placed This Encounter  Procedures  . For home use only DME oxygen  . Hgb A1c w/o eAG

## 2014-09-16 NOTE — Assessment & Plan Note (Signed)
Last lipid panel in June looked great; not due again until December

## 2014-09-16 NOTE — Patient Instructions (Signed)
We'll try to get [facility] to bring you portable oxygen for around-the-clock use at 2 l/m Have the A1C done at the cancer center with your next blood work Call me if you have not heard back from me after one week about your labs Decrease the benazepril from 40 mg daily to 20 mg daily Continue the multiple vitamins Return in 3 months

## 2014-09-16 NOTE — Assessment & Plan Note (Signed)
Low today, with low energy; decrease ACE-I

## 2014-09-17 ENCOUNTER — Inpatient Hospital Stay: Payer: PPO

## 2014-09-17 ENCOUNTER — Ambulatory Visit: Payer: PPO

## 2014-09-17 ENCOUNTER — Ambulatory Visit
Admission: RE | Admit: 2014-09-17 | Discharge: 2014-09-17 | Disposition: A | Discharge status: Home / Self Care | Payer: PPO | Source: Ambulatory Visit | Attending: Radiation Oncology | Admitting: Radiation Oncology

## 2014-09-17 DIAGNOSIS — Z510 Encounter for antineoplastic radiation therapy: Secondary | ICD-10-CM | POA: Diagnosis not present

## 2014-09-20 ENCOUNTER — Ambulatory Visit: Payer: PPO

## 2014-09-20 ENCOUNTER — Ambulatory Visit
Admission: RE | Admit: 2014-09-20 | Discharge: 2014-09-20 | Disposition: A | Discharge status: Home / Self Care | Payer: PPO | Source: Ambulatory Visit | Attending: Radiation Oncology | Admitting: Radiation Oncology

## 2014-09-20 DIAGNOSIS — Z510 Encounter for antineoplastic radiation therapy: Secondary | ICD-10-CM | POA: Diagnosis not present

## 2014-09-21 ENCOUNTER — Other Ambulatory Visit: Payer: Self-pay | Admitting: *Deleted

## 2014-09-21 ENCOUNTER — Ambulatory Visit: Payer: PPO

## 2014-09-21 ENCOUNTER — Ambulatory Visit
Admission: RE | Admit: 2014-09-21 | Discharge: 2014-09-21 | Disposition: A | Discharge status: Home / Self Care | Payer: PPO | Source: Ambulatory Visit | Attending: Radiation Oncology | Admitting: Radiation Oncology

## 2014-09-21 DIAGNOSIS — Z510 Encounter for antineoplastic radiation therapy: Secondary | ICD-10-CM | POA: Diagnosis not present

## 2014-09-22 ENCOUNTER — Ambulatory Visit
Admission: RE | Admit: 2014-09-22 | Discharge: 2014-09-22 | Disposition: A | Discharge status: Home / Self Care | Payer: PPO | Source: Ambulatory Visit | Attending: Radiation Oncology | Admitting: Radiation Oncology

## 2014-09-22 ENCOUNTER — Ambulatory Visit: Payer: PPO

## 2014-09-22 DIAGNOSIS — Z510 Encounter for antineoplastic radiation therapy: Secondary | ICD-10-CM | POA: Diagnosis not present

## 2014-09-23 ENCOUNTER — Ambulatory Visit
Admission: RE | Admit: 2014-09-23 | Discharge: 2014-09-23 | Disposition: A | Discharge status: Home / Self Care | Payer: PPO | Source: Ambulatory Visit | Attending: Radiation Oncology | Admitting: Radiation Oncology

## 2014-09-23 ENCOUNTER — Inpatient Hospital Stay: Payer: PPO | Admitting: Oncology

## 2014-09-23 ENCOUNTER — Ambulatory Visit: Payer: PPO

## 2014-09-23 ENCOUNTER — Telehealth: Payer: Self-pay | Admitting: *Deleted

## 2014-09-23 ENCOUNTER — Inpatient Hospital Stay: Payer: PPO

## 2014-09-23 DIAGNOSIS — Z510 Encounter for antineoplastic radiation therapy: Secondary | ICD-10-CM | POA: Diagnosis not present

## 2014-09-23 NOTE — Telephone Encounter (Signed)
Called pt to see if was going to keep appointment for labs and to see md today. Pt stated is already back at home in [place] and did not want to leave home at this time. Informed pt that MD needs to see her prior to treatment scheduled on Friday on 9/23 and if could not come today we will need to reschedule her treatment to next week. Pt verbalized understanding. Pt has been rescheduled to see md with labs on 9/29. Treatment has been rescheduled to Friday 9/30.

## 2014-09-24 ENCOUNTER — Ambulatory Visit: Payer: PPO

## 2014-09-24 ENCOUNTER — Other Ambulatory Visit: Payer: PPO

## 2014-09-24 ENCOUNTER — Inpatient Hospital Stay: Payer: PPO

## 2014-09-24 ENCOUNTER — Ambulatory Visit
Admission: RE | Admit: 2014-09-24 | Discharge: 2014-09-24 | Disposition: A | Discharge status: Home / Self Care | Payer: PPO | Source: Ambulatory Visit | Attending: Radiation Oncology | Admitting: Radiation Oncology

## 2014-09-24 DIAGNOSIS — Z510 Encounter for antineoplastic radiation therapy: Secondary | ICD-10-CM | POA: Diagnosis not present

## 2014-09-27 ENCOUNTER — Ambulatory Visit
Admission: RE | Admit: 2014-09-27 | Discharge: 2014-09-27 | Disposition: A | Discharge status: Home / Self Care | Payer: PPO | Source: Ambulatory Visit | Attending: Radiation Oncology | Admitting: Radiation Oncology

## 2014-09-27 ENCOUNTER — Ambulatory Visit: Payer: PPO

## 2014-09-28 ENCOUNTER — Ambulatory Visit: Payer: PPO

## 2014-09-28 ENCOUNTER — Ambulatory Visit
Admission: RE | Admit: 2014-09-28 | Discharge: 2014-09-28 | Disposition: A | Discharge status: Home / Self Care | Payer: PPO | Source: Ambulatory Visit | Attending: Radiation Oncology | Admitting: Radiation Oncology

## 2014-09-29 ENCOUNTER — Ambulatory Visit
Admission: RE | Admit: 2014-09-29 | Discharge: 2014-09-29 | Disposition: A | Discharge status: Home / Self Care | Payer: PPO | Source: Ambulatory Visit | Attending: Radiation Oncology | Admitting: Radiation Oncology

## 2014-09-29 ENCOUNTER — Ambulatory Visit: Payer: PPO

## 2014-09-30 ENCOUNTER — Inpatient Hospital Stay: Payer: PPO

## 2014-09-30 ENCOUNTER — Inpatient Hospital Stay: Payer: PPO | Admitting: Oncology

## 2014-09-30 ENCOUNTER — Ambulatory Visit
Admission: RE | Admit: 2014-09-30 | Discharge: 2014-09-30 | Disposition: A | Discharge status: Home / Self Care | Payer: PPO | Source: Ambulatory Visit | Attending: Radiation Oncology | Admitting: Radiation Oncology

## 2014-09-30 ENCOUNTER — Ambulatory Visit: Payer: PPO

## 2014-10-01 ENCOUNTER — Ambulatory Visit: Payer: PPO

## 2014-10-01 ENCOUNTER — Inpatient Hospital Stay: Payer: PPO

## 2014-10-04 ENCOUNTER — Ambulatory Visit: Payer: PPO

## 2014-10-05 ENCOUNTER — Ambulatory Visit: Admission: RE | Admit: 2014-10-05 | Payer: PPO | Source: Ambulatory Visit

## 2014-10-06 ENCOUNTER — Other Ambulatory Visit: Payer: Self-pay

## 2014-10-06 ENCOUNTER — Ambulatory Visit: Payer: PPO

## 2014-10-06 ENCOUNTER — Encounter: Payer: Self-pay | Admitting: Oncology

## 2014-10-06 ENCOUNTER — Inpatient Hospital Stay: Payer: PPO | Attending: Oncology

## 2014-10-06 ENCOUNTER — Inpatient Hospital Stay (HOSPITAL_BASED_OUTPATIENT_CLINIC_OR_DEPARTMENT_OTHER): Payer: PPO | Admitting: Oncology

## 2014-10-06 VITALS — BP 112/62 | HR 103 | Temp 97.9°F | Wt 276.7 lb

## 2014-10-06 DIAGNOSIS — J449 Chronic obstructive pulmonary disease, unspecified: Secondary | ICD-10-CM | POA: Diagnosis not present

## 2014-10-06 DIAGNOSIS — Z79899 Other long term (current) drug therapy: Secondary | ICD-10-CM | POA: Diagnosis not present

## 2014-10-06 DIAGNOSIS — E785 Hyperlipidemia, unspecified: Secondary | ICD-10-CM

## 2014-10-06 DIAGNOSIS — R51 Headache: Secondary | ICD-10-CM | POA: Diagnosis not present

## 2014-10-06 DIAGNOSIS — R0602 Shortness of breath: Secondary | ICD-10-CM

## 2014-10-06 DIAGNOSIS — I10 Essential (primary) hypertension: Secondary | ICD-10-CM

## 2014-10-06 DIAGNOSIS — F419 Anxiety disorder, unspecified: Secondary | ICD-10-CM | POA: Insufficient documentation

## 2014-10-06 DIAGNOSIS — F329 Major depressive disorder, single episode, unspecified: Secondary | ICD-10-CM | POA: Insufficient documentation

## 2014-10-06 DIAGNOSIS — Z87891 Personal history of nicotine dependence: Secondary | ICD-10-CM | POA: Diagnosis not present

## 2014-10-06 DIAGNOSIS — I509 Heart failure, unspecified: Secondary | ICD-10-CM | POA: Diagnosis not present

## 2014-10-06 DIAGNOSIS — R531 Weakness: Secondary | ICD-10-CM

## 2014-10-06 DIAGNOSIS — Z8673 Personal history of transient ischemic attack (TIA), and cerebral infarction without residual deficits: Secondary | ICD-10-CM | POA: Diagnosis not present

## 2014-10-06 DIAGNOSIS — R5383 Other fatigue: Secondary | ICD-10-CM | POA: Insufficient documentation

## 2014-10-06 DIAGNOSIS — E669 Obesity, unspecified: Secondary | ICD-10-CM

## 2014-10-06 DIAGNOSIS — C3490 Malignant neoplasm of unspecified part of unspecified bronchus or lung: Secondary | ICD-10-CM

## 2014-10-06 DIAGNOSIS — E1165 Type 2 diabetes mellitus with hyperglycemia: Secondary | ICD-10-CM

## 2014-10-06 DIAGNOSIS — E119 Type 2 diabetes mellitus without complications: Secondary | ICD-10-CM | POA: Diagnosis not present

## 2014-10-06 DIAGNOSIS — Z794 Long term (current) use of insulin: Principal | ICD-10-CM

## 2014-10-06 DIAGNOSIS — C3431 Malignant neoplasm of lower lobe, right bronchus or lung: Secondary | ICD-10-CM | POA: Diagnosis present

## 2014-10-06 DIAGNOSIS — C3491 Malignant neoplasm of unspecified part of right bronchus or lung: Secondary | ICD-10-CM

## 2014-10-06 LAB — CBC WITH DIFFERENTIAL/PLATELET
BASOS ABS: 0 10*3/uL (ref 0–0.1)
Basophils Relative: 0 %
EOS ABS: 0.1 10*3/uL (ref 0–0.7)
EOS PCT: 1 %
HCT: 38.3 % (ref 35.0–47.0)
HEMOGLOBIN: 12.6 g/dL (ref 12.0–16.0)
Lymphocytes Relative: 4 %
Lymphs Abs: 0.4 10*3/uL — ABNORMAL LOW (ref 1.0–3.6)
MCH: 28.3 pg (ref 26.0–34.0)
MCHC: 33 g/dL (ref 32.0–36.0)
MCV: 85.7 fL (ref 80.0–100.0)
Monocytes Absolute: 0.9 10*3/uL (ref 0.2–0.9)
Monocytes Relative: 10 %
NEUTROS PCT: 85 %
Neutro Abs: 7.1 10*3/uL — ABNORMAL HIGH (ref 1.4–6.5)
PLATELETS: 221 10*3/uL (ref 150–440)
RBC: 4.47 MIL/uL (ref 3.80–5.20)
RDW: 16.4 % — ABNORMAL HIGH (ref 11.5–14.5)
WBC: 8.4 10*3/uL (ref 3.6–11.0)

## 2014-10-06 LAB — COMPREHENSIVE METABOLIC PANEL
ALBUMIN: 3.6 g/dL (ref 3.5–5.0)
ALT: 10 U/L — AB (ref 14–54)
AST: 14 U/L — AB (ref 15–41)
Alkaline Phosphatase: 63 U/L (ref 38–126)
Anion gap: 7 (ref 5–15)
BUN: 9 mg/dL (ref 6–20)
CHLORIDE: 97 mmol/L — AB (ref 101–111)
CO2: 33 mmol/L — AB (ref 22–32)
CREATININE: 0.48 mg/dL (ref 0.44–1.00)
Calcium: 8.8 mg/dL — ABNORMAL LOW (ref 8.9–10.3)
GFR calc Af Amer: >60 mL/min (ref >60)
GFR calc non Af Amer: >60 mL/min (ref >60)
GLUCOSE: 116 mg/dL — AB (ref 65–99)
Potassium: 4.7 mmol/L (ref 3.5–5.1)
SODIUM: 137 mmol/L (ref 135–145)
Total Bilirubin: 1.2 mg/dL (ref 0.3–1.2)
Total Protein: 7.3 g/dL (ref 6.5–8.1)

## 2014-10-06 LAB — MAGNESIUM: Magnesium: 1.6 mg/dL — ABNORMAL LOW (ref 1.7–2.4)

## 2014-10-06 NOTE — Progress Notes (Signed)
Patient does not have living will.  Former smoker.  Patient complains of weakness.

## 2014-10-07 ENCOUNTER — Encounter: Payer: Self-pay | Admitting: Oncology

## 2014-10-07 ENCOUNTER — Ambulatory Visit
Admission: RE | Admit: 2014-10-07 | Discharge: 2014-10-07 | Disposition: A | Discharge status: Home / Self Care | Payer: PPO | Source: Ambulatory Visit | Attending: Radiation Oncology | Admitting: Radiation Oncology

## 2014-10-07 DIAGNOSIS — Z510 Encounter for antineoplastic radiation therapy: Secondary | ICD-10-CM | POA: Diagnosis not present

## 2014-10-07 MED ORDER — GLUCOSE BLOOD VI STRP: ORAL_STRIP | Status: DC

## 2014-10-07 NOTE — Assessment & Plan Note (Signed)
New Rx for test strips given

## 2014-10-07 NOTE — Telephone Encounter (Signed)
Med list updated; rx sent

## 2014-10-07 NOTE — Progress Notes (Signed)
Sherry Mueller (MR# [id number])      Progress Notes Info    Author Note Status Last Update User Last Update Date/Time   Lequita Asal, MD Signed Lequita Asal, MD 09/05/2014 4:37 PM    Progress Notes    Expand All Collapse All   [facility] day: 09/02/2014  Chief Complaint: Sherry Mueller is a 74 y.o. female with clinical stage IIIB for assessment prior to initiation of chemotherapy with ongoing radiation therapy.  HPI: The patient was last seen in the medical oncology clinic on 08/27/2014. At that time, she was to begin low dose carboplatin and Taxol with concurrent chemotherapy and radiation. Chemotherapy was not given as she adamantly refused Decadron as part of her premedications secondary to issues with poorly controlled blood sugar. She had previously been prescribed Decadron 4 mg a day. Because of the need for Decadron as a premedication for her Taxol, chemotherapy was cancelled.   She has continued her radiation. She has received 12 fractions of radiation to date. .   Patient came today for further follow-up as already missed 2 appointments for chemotherapy.  Initially she resisted chemotherapy because steroid was used.  Later on when Abraxane got approved patient did not show up for chemotherapy today complaining that radiation made her extremely weak and has not received radiation for a week now.  Complains of energy being drained out.  Here for further evaluation and treatment consideration  Past Medical History  Diagnosis Date  . Stroke   . Hyperlipidemia   . CHF (congestive heart failure)   . Cancer   . Hypertension   . COPD (chronic obstructive pulmonary disease)   . Shortness of breath dyspnea   . Diabetes mellitus without complication   . Depression   . Anxiety   . Headache     Past Surgical History  Procedure Laterality Date  . Breast surgery Left  cyst removed  . Cardiac catheterization    . Colonoscopy      Family History  Problem Relation Age of Onset  . Heart disease Mother   . Hypertension Mother   . Heart disease Father   . Stroke Father     Social History:  reports that she quit smoking about 7 years ago. Her smoking use included Cigarettes. She smoked 2.00 packs per day. She has never used smokeless tobacco. She reports that she does not drink alcohol or use illicit drugs. The patient is accompanied by her son.  Allergies:  Allergies  Allergen Reactions  . Byetta 10 Mcg Pen [Exenatide] Nausea Only  . Hctz [Hydrochlorothiazide] Nausea Only  . Penicillins Itching  . Plavix [Clopidogrel] Nausea Only  . Plavix [Clopidogrel] Nausea And Vomiting    Current Medications: Current Outpatient Prescriptions  Medication Sig Dispense Refill  . albuterol-ipratropium (COMBIVENT) 18-103 MCG/ACT inhaler Inhale 2 puffs into the lungs every 4 (four) hours as needed for wheezing or shortness of breath.    Marland Kitchen amLODipine (NORVASC) 5 MG tablet TAKE 1 TABLET BY MOUTH EVERY DAY 30 tablet 3  . aspirin 81 MG tablet Take 81 mg by mouth 2 (two) times daily.     . B Complex Vitamins (B COMPLEX PO) Take 1 tablet by mouth every morning.    . benazepril (LOTENSIN) 40 MG tablet TAKE 1 TABLET BY MOUTH EVERY DAY 30 tablet 5  . busPIRone (BUSPAR) 10 MG tablet Take 10 mg by mouth 2 (two) times daily as needed.    Marland Kitchen  Cholecalciferol (VITAMIN D3) 2000 UNITS TABS Take 1 tablet by mouth every morning.    . citalopram (CELEXA) 40 MG tablet TAKE 1 TABLET BY MOUTH AT BEDTIME 30 tablet 2  . co-enzyme Q-10 30 MG capsule Take 30 mg by mouth daily.    Marland Kitchen  other possibility if insurance approves that    0  . fluticasone (FLONASE) 50 MCG/ACT nasal spray Place 2 sprays into both nostrils daily. 15.8 g 11  . Fluticasone-Salmeterol (ADVAIR DISKUS) 250-50 MCG/DOSE  AEPB Inhale 1 puff into the lungs as needed.    Marland Kitchen glimepiride (AMARYL) 4 MG tablet TAKE 1 TABLET BY MOUTH EVERY DAY 30 tablet 5  . glucose monitoring kit (FREESTYLE) monitoring kit 1 each by Does not apply route as needed for other.    . guaiFENesin-codeine 100-10 MG/5ML syrup Take 5 mLs by mouth 3 (three) times daily as needed for cough. 120 mL 2  . Ipratropium-Albuterol (COMBIVENT RESPIMAT) 20-100 MCG/ACT AERS respimat Inhale 1 puff into the lungs every 6 (six) hours.    . isosorbide mononitrate (IMDUR) 30 MG 24 hr tablet TAKE 1 TABLET BY MOUTH EVERY DAY 30 tablet 5  . lansoprazole (PREVACID) 30 MG capsule Take 1 capsule (30 mg total) by mouth daily at 12 noon. 30 capsule 2  . levofloxacin (LEVAQUIN) 500 MG tablet Take 1 tablet (500 mg total) by mouth daily. 10 tablet 0  . metFORMIN (GLUCOPHAGE) 1000 MG tablet TAKE 1 TABLET BY MOUTH TWICE A DAY 60 tablet 2  . metFORMIN (GLUCOPHAGE-XR) 500 MG 24 hr tablet Take 2 tablets (1,000 mg total) by mouth 2 (two) times daily. Take 2 tablets twice a day 360 tablet 1  . Multiple Vitamins-Minerals (MULTIVITAMIN PO) Take 1 tablet by mouth 2 (two) times daily.    . Omega-3 Fatty Acids (FISH OIL PO) Take 1,000 mg by mouth every morning.    . pravastatin (PRAVACHOL) 80 MG tablet TAKE 1 TABLET BY MOUTH AT BEDTIME 30 tablet 2   No current facility-administered medications for this visit.    Review of Systems:   general status: Patient is feeling weak and tired.  As per history of present illness complains of complete energy being drained out No change in a performance status.  No chills.  No fever. HEENT:   No evidence of stomatitis He should not has some difficulty swallowing. Lungs: No cough or shortness of breath Cardiac: No chest pain or paroxysmal nocturnal dyspnea GI: No nausea no vomiting no diarrhea no abdominal pain Skin: No rash Lower extremity no swelling Neurological system: No  tingling.  No numbness.  No other focal signs Musculoskeletal system no bony pains  Physical Exam: Patient is obese with BMI of 44 Not in any acute distress.  Very depressed.Lymphatic system: Supraclavicular, cervical, axillary, inguinal lymph nodes are not palpable lungs: Diminished air entry on both sides heart tachycardia.  Soft systolic murmur Abdominal exam revealed normal bowel sounds. The abdomen was soft, non-tender, and without masses, organomegaly, or appreciable enlargement of the abdominal aorta. Examination of the skin revealed no evidence of significant rashes, suspicious appearing nevi or other concerning lesions. Lower extremity no edema Neurologically, the patient was awake, alert, and oriented to person, place and time. There were no obvious focal neurologic abnormalities. Psychiatric system: Patient is overwhelmed with diagnosis and need for the treatment and underlying problem with diabetes  Assessment: ZENIYA LAPIDUS is a 74 y.o. 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 She has a 80-100 pack smoking history. She presented with a right sided lung mass on screening chest CT. Chest CT on 06/18/2014 revealed a 5.0 x 3.8 x 4.7 cm right retrohilar mass in the superior segment of the right lower lobe. The mass was contiguous with and indents/narrows the bronchus intermedius. There was mediastinal and suspected right hilar adenopathy measuring between 1.1 and 1.9 cm. There were a few tiny nonspecific pulmonary nodules. There was an infiltrate within the superior segment of the left lower lobe suggesting pneumonia.  PET scan on 06/29/2014 revealed hypermetabolic  right lower lobe pulmonary mass consistent with bronchogenic carcinoma. There were hypermetabolic left mediastinal lymph nodes and prevascular nodes consistent with nodal metastasis. There was a nodular consolidation within the medial left lower lobe. There were bilateral supraclavicular node metastasis.  Bronchoscopy with biopsy on 07/08/2014 revealed extrinsic compression of the right lower lobe secondary to mass. 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  Head CT on 07/21/2014 revealed no evidence of metastatic disease. She began radiation on 08/18/2014.  Symptomatically, she has chronic shortness of breath. She is adamant that she will not receive any steroids with treatment.  Plan: Patient has been off from radiation therapy because of generalized weakness she has not received any chemotherapy since the first dose of carboplatinum She is  overwhelmed with underlying problem of diabetes, weight gain, depression, and complaining SEVERAL side effects of radiation therapy At this point in time I would like her to resume radiation therapy.  I doubt patient can tolerate physically as well as mentally on current chemoradiation therapy After radiation therapy is finished a PET scan will be done Reevaluation after PET scan. The patient is only 5 radiation therapy left and has received only 1 carboplatinum chemotherapy. Total duration of visit was 83mnutes.  50% or more time was spent in counseling patient and family regarding prognosis and options of treatment and available resources   l

## 2014-10-08 ENCOUNTER — Ambulatory Visit: Payer: PPO

## 2014-10-08 ENCOUNTER — Ambulatory Visit
Admission: RE | Admit: 2014-10-08 | Discharge: 2014-10-08 | Disposition: A | Discharge status: Home / Self Care | Payer: PPO | Source: Ambulatory Visit | Attending: Radiation Oncology | Admitting: Radiation Oncology

## 2014-10-08 DIAGNOSIS — Z510 Encounter for antineoplastic radiation therapy: Secondary | ICD-10-CM | POA: Diagnosis not present

## 2014-10-11 ENCOUNTER — Ambulatory Visit: Payer: PPO

## 2014-10-11 ENCOUNTER — Ambulatory Visit
Admission: RE | Admit: 2014-10-11 | Discharge: 2014-10-11 | Disposition: A | Discharge status: Home / Self Care | Payer: PPO | Source: Ambulatory Visit | Attending: Radiation Oncology | Admitting: Radiation Oncology

## 2014-10-11 DIAGNOSIS — Z510 Encounter for antineoplastic radiation therapy: Secondary | ICD-10-CM | POA: Diagnosis not present

## 2014-10-12 ENCOUNTER — Ambulatory Visit
Admission: RE | Admit: 2014-10-12 | Discharge: 2014-10-12 | Disposition: A | Discharge status: Home / Self Care | Payer: PPO | Source: Ambulatory Visit | Attending: Radiation Oncology | Admitting: Radiation Oncology

## 2014-10-12 DIAGNOSIS — Z510 Encounter for antineoplastic radiation therapy: Secondary | ICD-10-CM | POA: Diagnosis not present

## 2014-10-13 ENCOUNTER — Ambulatory Visit
Admission: RE | Admit: 2014-10-13 | Discharge: 2014-10-13 | Disposition: A | Discharge status: Home / Self Care | Payer: PPO | Source: Ambulatory Visit | Attending: Radiation Oncology | Admitting: Radiation Oncology

## 2014-10-13 DIAGNOSIS — Z510 Encounter for antineoplastic radiation therapy: Secondary | ICD-10-CM | POA: Diagnosis not present

## 2014-11-02 ENCOUNTER — Ambulatory Visit: Payer: PPO

## 2014-11-04 ENCOUNTER — Ambulatory Visit: Payer: PPO | Admitting: Radiation Oncology

## 2014-11-04 ENCOUNTER — Other Ambulatory Visit: Payer: PPO

## 2014-11-04 ENCOUNTER — Ambulatory Visit: Payer: PPO | Admitting: Oncology

## 2014-11-10 ENCOUNTER — Ambulatory Visit
Admission: RE | Admit: 2014-11-10 | Discharge: 2014-11-10 | Disposition: A | Discharge status: Home / Self Care | Payer: PPO | Source: Ambulatory Visit | Attending: Oncology | Admitting: Oncology

## 2014-11-10 DIAGNOSIS — Z08 Encounter for follow-up examination after completed treatment for malignant neoplasm: Secondary | ICD-10-CM | POA: Insufficient documentation

## 2014-11-10 DIAGNOSIS — R590 Localized enlarged lymph nodes: Secondary | ICD-10-CM | POA: Insufficient documentation

## 2014-11-10 DIAGNOSIS — R918 Other nonspecific abnormal finding of lung field: Secondary | ICD-10-CM | POA: Insufficient documentation

## 2014-11-10 DIAGNOSIS — C3491 Malignant neoplasm of unspecified part of right bronchus or lung: Secondary | ICD-10-CM | POA: Diagnosis present

## 2014-11-10 MED ORDER — IOHEXOL 300 MG/ML  SOLN
75.0000 mL | Freq: Once | INTRAMUSCULAR | Status: AC | PRN
  Administered 2014-11-10: 75 mL via INTRAVENOUS

## 2014-11-11 ENCOUNTER — Inpatient Hospital Stay (HOSPITAL_BASED_OUTPATIENT_CLINIC_OR_DEPARTMENT_OTHER): Payer: PPO | Admitting: Oncology

## 2014-11-11 ENCOUNTER — Inpatient Hospital Stay: Payer: PPO | Attending: Oncology

## 2014-11-11 ENCOUNTER — Encounter: Payer: Self-pay | Admitting: Oncology

## 2014-11-11 VITALS — BP 118/69 | HR 106 | Temp 98.3°F | Wt 277.6 lb

## 2014-11-11 DIAGNOSIS — C7801 Secondary malignant neoplasm of right lung: Secondary | ICD-10-CM | POA: Insufficient documentation

## 2014-11-11 DIAGNOSIS — Z79899 Other long term (current) drug therapy: Secondary | ICD-10-CM

## 2014-11-11 DIAGNOSIS — R000 Tachycardia, unspecified: Secondary | ICD-10-CM | POA: Diagnosis not present

## 2014-11-11 DIAGNOSIS — E119 Type 2 diabetes mellitus without complications: Secondary | ICD-10-CM

## 2014-11-11 DIAGNOSIS — Z87891 Personal history of nicotine dependence: Secondary | ICD-10-CM | POA: Insufficient documentation

## 2014-11-11 DIAGNOSIS — R590 Localized enlarged lymph nodes: Secondary | ICD-10-CM | POA: Diagnosis not present

## 2014-11-11 DIAGNOSIS — C7802 Secondary malignant neoplasm of left lung: Secondary | ICD-10-CM | POA: Diagnosis not present

## 2014-11-11 DIAGNOSIS — Z794 Long term (current) use of insulin: Secondary | ICD-10-CM | POA: Insufficient documentation

## 2014-11-11 DIAGNOSIS — Z8673 Personal history of transient ischemic attack (TIA), and cerebral infarction without residual deficits: Secondary | ICD-10-CM

## 2014-11-11 DIAGNOSIS — C3490 Malignant neoplasm of unspecified part of unspecified bronchus or lung: Secondary | ICD-10-CM

## 2014-11-11 DIAGNOSIS — C3411 Malignant neoplasm of upper lobe, right bronchus or lung: Secondary | ICD-10-CM | POA: Diagnosis not present

## 2014-11-11 DIAGNOSIS — C3491 Malignant neoplasm of unspecified part of right bronchus or lung: Secondary | ICD-10-CM

## 2014-11-11 DIAGNOSIS — F329 Major depressive disorder, single episode, unspecified: Secondary | ICD-10-CM

## 2014-11-11 DIAGNOSIS — C3432 Malignant neoplasm of lower lobe, left bronchus or lung: Secondary | ICD-10-CM | POA: Insufficient documentation

## 2014-11-11 DIAGNOSIS — E669 Obesity, unspecified: Secondary | ICD-10-CM

## 2014-11-11 DIAGNOSIS — Z7984 Long term (current) use of oral hypoglycemic drugs: Secondary | ICD-10-CM

## 2014-11-11 DIAGNOSIS — R0609 Other forms of dyspnea: Secondary | ICD-10-CM | POA: Insufficient documentation

## 2014-11-11 DIAGNOSIS — I2510 Atherosclerotic heart disease of native coronary artery without angina pectoris: Secondary | ICD-10-CM | POA: Diagnosis not present

## 2014-11-11 DIAGNOSIS — Z923 Personal history of irradiation: Secondary | ICD-10-CM

## 2014-11-11 DIAGNOSIS — F419 Anxiety disorder, unspecified: Secondary | ICD-10-CM

## 2014-11-11 LAB — COMPREHENSIVE METABOLIC PANEL
ALK PHOS: 75 U/L (ref 38–126)
ALT: 12 U/L — AB (ref 14–54)
AST: 23 U/L (ref 15–41)
Albumin: 3.5 g/dL (ref 3.5–5.0)
Anion gap: 10 (ref 5–15)
BUN: 13 mg/dL (ref 6–20)
CO2: 29 mmol/L (ref 22–32)
CREATININE: 0.56 mg/dL (ref 0.44–1.00)
Calcium: 9.7 mg/dL (ref 8.9–10.3)
Chloride: 96 mmol/L — ABNORMAL LOW (ref 101–111)
GFR calc Af Amer: >60 mL/min (ref >60)
Glucose, Bld: 257 mg/dL — ABNORMAL HIGH (ref 65–99)
Potassium: 3.9 mmol/L (ref 3.5–5.1)
Sodium: 135 mmol/L (ref 135–145)
TOTAL PROTEIN: 6.8 g/dL (ref 6.5–8.1)
Total Bilirubin: 0.8 mg/dL (ref 0.3–1.2)

## 2014-11-11 LAB — CBC WITH DIFFERENTIAL/PLATELET
BASOS ABS: 0.1 10*3/uL (ref 0–0.1)
Basophils Relative: 1 %
Eosinophils Absolute: 0.2 10*3/uL (ref 0–0.7)
Eosinophils Relative: 2 %
HEMATOCRIT: 40.6 % (ref 35.0–47.0)
Hemoglobin: 13.2 g/dL (ref 12.0–16.0)
LYMPHS PCT: 6 %
Lymphs Abs: 0.5 10*3/uL — ABNORMAL LOW (ref 1.0–3.6)
MCH: 27.3 pg (ref 26.0–34.0)
MCHC: 32.5 g/dL (ref 32.0–36.0)
MCV: 83.9 fL (ref 80.0–100.0)
MONO ABS: 0.9 10*3/uL (ref 0.2–0.9)
Monocytes Relative: 12 %
NEUTROS ABS: 5.7 10*3/uL (ref 1.4–6.5)
Neutrophils Relative %: 79 %
Platelets: 288 10*3/uL (ref 150–440)
RBC: 4.84 MIL/uL (ref 3.80–5.20)
RDW: 16.3 % — ABNORMAL HIGH (ref 11.5–14.5)
WBC: 7.3 10*3/uL (ref 3.6–11.0)

## 2014-11-11 NOTE — Progress Notes (Signed)
Sherry Mueller (MR# [id number])     [place] @ [facility] [phone]  Fax:([phone]) [place] [facility]: [birth year]  MR#: [id number]  WSF#:[id number]  Patient Care Team: Arnetha Courser, MD as PCP - General (Family Medicine) Arnetha Courser, MD (Family Medicine)  CHIEF COMPLAINT:  Chief Complaint  Patient presents with  . OTHER    1.  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 Status post radiation therapy but patient did not have any significant amount of chemotherapy only one dose of carboplatinum has patient declined chemotherapy along with radiation treatment because of side effect of radiation therapy Diagnosis in July of 2016 2.  Progressive disease in the left lower lobe on recent CT scan of November 2 016 Patient is starting carboplatinum and Abraxane (November, 2016)  Oncology Flowsheet 07/08/2014 09/10/2014  Day, Cycle - Day 1, Cycle 1  CARBOplatin (PARAPLATIN) IV - 250 mg  dexamethasone (DECADRON) IJ - -  ondansetron (ZOFRAN) IV - [ 16 mg ]    INTERVAL HISTORY:  74 year old lady with moderate obesity and insulin-dependent diabetes came today further follow-up has shortness of breath on exertion patient is using oxygen at home.  Patient had a repeat CT scan be sows progressive disease in the left lower lobe the area which was initially suspected to be pneumonia.  Right lower lobe mass and the hilar adenopathy has responded to radiation therapy. Patient is somewhat depressed.  Here for ongoing evaluation and treatment consideration No chest pain.  Dry hacking cough.  No hemoptysis. REVIEW OF SYSTEMS:   Gen. status: No chills or fever.  Shortness of breath on exertion.  Lungs: Dry hacking cough.  No hemoptysis or chest pain Cardiac: No chest discomfort or paroxysmal nocturnal dyspnea GI: No nausea no vomiting no diarrhea GU: No dysuria  hematuria Lower extremity intermittent swelling neurological system no headache no dizziness skin: No rash HEENT: No difficulty swallowing All other systems have been reviewed  As per HPI. Otherwise, a complete review of systems is negatve.  PAST MEDICAL HISTORY: Past Medical History  Diagnosis Date  . Stroke ([place])   . Hyperlipidemia   . Cancer ([place])   . Hypertension   . COPD (chronic obstructive pulmonary disease) ([place])   . Shortness of breath dyspnea   . Diabetes mellitus without complication ([place])   . Depression   . Anxiety   . Headache     PAST SURGICAL HISTORY: Past Surgical History  Procedure Laterality Date  . Breast surgery Left cyst removed  . Cardiac catheterization    . Colonoscopy    . Endobronchial ultrasound Right 07/08/2014    Procedure: ENDOBRONCHIAL ULTRASOUND;  Surgeon: Vilinda Boehringer, MD;  Location: [facility] ORS;  Service: Cardiopulmonary;  Laterality: Right;    FAMILY HISTORY Family History  Problem Relation Age of Onset  . Heart disease Mother   . Hypertension Mother   . Heart disease Father   . Stroke Father     ADVANCED DIRECTIVES:  No flowsheet data found.  HEALTH MAINTENANCE: Social History  Substance Use Topics  . Smoking status: Former Smoker -- 2.00 packs/day    Types: Cigarettes    Quit date: 06/14/2004  . Smokeless tobacco: Never Used  . Alcohol Use: No      Allergies  Allergen Reactions  . Byetta 10 Mcg Pen [Exenatide] Nausea Only  . Hctz [Hydrochlorothiazide]  Nausea Only  . Penicillins Itching  . Plavix [Clopidogrel] Nausea Only  . Plavix [Clopidogrel] Nausea And Vomiting    Current Outpatient Prescriptions  Medication Sig Dispense Refill  . albuterol-ipratropium (COMBIVENT) 18-103 MCG/ACT inhaler Inhale 2 puffs into the lungs every 4 (four) hours as needed for wheezing or shortness of breath.    Marland Kitchen amLODipine (NORVASC) 5 MG tablet TAKE 1 TABLET BY MOUTH EVERY DAY 30 tablet 3  . B Complex Vitamins (B COMPLEX PO) Take 1 tablet  by mouth every morning.    . benazepril (LOTENSIN) 20 MG tablet Take 1 tablet (20 mg total) by mouth daily. 90 tablet 3  . busPIRone (BUSPAR) 10 MG tablet Take 10 mg by mouth 2 (two) times daily as needed.    . Cholecalciferol (VITAMIN D3) 2000 UNITS TABS Take 1 tablet by mouth every morning.    . citalopram (CELEXA) 40 MG tablet TAKE 1 TABLET BY MOUTH AT BEDTIME 30 tablet 2  . co-enzyme Q-10 30 MG capsule Take 30 mg by mouth daily.    Marland Kitchen dexamethasone (DECADRON) 4 MG tablet Take 1 tablet (4 mg total) by mouth daily. Take 1 tablet daily for 10 days, then take 1 tablet every other day until finished. 18 tablet 0  . fluticasone (FLONASE) 50 MCG/ACT nasal spray Place 2 sprays into both nostrils daily. 15.8 g 11  . Fluticasone-Salmeterol (ADVAIR DISKUS) 250-50 MCG/DOSE AEPB Inhale 1 puff into the lungs as needed.    Marland Kitchen glimepiride (AMARYL) 4 MG tablet TAKE 1 TABLET BY MOUTH EVERY DAY 30 tablet 5  . glucose blood test strip OneTouch Ultra test strips; check glucose 3x a day; LON 99 months; E11.65 100 each 11  . glucose monitoring kit (FREESTYLE) monitoring kit 1 each by Does not apply route as needed for other.    . guaiFENesin-codeine 100-10 MG/5ML syrup Take 5 mLs by mouth 3 (three) times daily as needed for cough. 120 mL 2  . Insulin Detemir (LEVEMIR FLEXPEN) 100 UNIT/ML Pen Inject 10 Units into the skin daily at 10 pm. Increase by three units every three days until blood sugars are less than 180-200 15 mL 5  . Insulin Pen Needle 31G X 5 MM MISC Use with insulin pen to inject insulin under the skin once a day 100 each 1  . Ipratropium-Albuterol (COMBIVENT RESPIMAT) 20-100 MCG/ACT AERS respimat Inhale 1 puff into the lungs every 6 (six) hours.    . isosorbide mononitrate (IMDUR) 30 MG 24 hr tablet TAKE 1 TABLET BY MOUTH EVERY DAY 30 tablet 5  . lansoprazole (PREVACID) 30 MG capsule Take 1 capsule (30 mg total) by mouth daily at 12 noon. 30 capsule 2  . metFORMIN (GLUCOPHAGE-XR) 500 MG 24 hr tablet Take  2 tablets (1,000 mg total) by mouth 2 (two) times daily. Take 2 tablets twice a day 360 tablet 1  . Multiple Vitamins-Minerals (MULTIVITAMIN PO) Take 1 tablet by mouth 2 (two) times daily.    . Omega-3 Fatty Acids (FISH OIL PO) Take 1,000 mg by mouth every morning.    . ondansetron (ZOFRAN) 4 MG tablet Take 1 tablet (4 mg total) by mouth every 6 (six) hours as needed for nausea or vomiting. 30 tablet 3  . pravastatin (PRAVACHOL) 80 MG tablet TAKE 1 TABLET BY MOUTH AT BEDTIME 30 tablet 2   No current facility-administered medications for this visit.    OBJECTIVE:  Filed Vitals:   11/11/14 1055  BP: 118/69  Pulse: 106  Temp: 98.3 F (36.8 C)  Body mass index is 44.55 kg/(m^2).    ECOG FS:1 - Symptomatic but completely ambulatory  PHYSICAL EXAM: Gen. status: Moderately of obese lady in mild distress because of exertion.  Oxygen never dropped to 87% on exertion Lungs: Diminished air entry on both sides.  No rhonchi or rales Cardiac: Tachycardia Lymphatic system: Supraclavicular, cervical, axillary, inguinal lymph nodes are not palpable Abdominal exam revealed normal bowel sounds. The abdomen was soft, non-tender, and without masses, organomegaly, or appreciable enlargement of the abdominal aorta. Examination of the skin revealed no evidence of significant rashes, suspicious appearing nevi or other concerning lesions. Neurologically, the patient was awake, alert, and oriented to person, place and time. There were no obvious focal neurologic abnormalities. Skin no skeletal system no bony pain   LAB RESULTS:  CBC Latest Ref Rng 11/11/2014 10/06/2014  WBC 3.6 - 11.0 K/uL 7.3 8.4  Hemoglobin 12.0 - 16.0 g/dL 13.2 12.6  Hematocrit 35.0 - 47.0 % 40.6 38.3  Platelets 150 - 440 K/uL 288 221    Appointment on 11/11/2014  Component Date Value Ref Range Status  . WBC 11/11/2014 7.3  3.6 - 11.0 K/uL Final  . RBC 11/11/2014 4.84  3.80 - 5.20 MIL/uL Final  . Hemoglobin 11/11/2014 13.2  12.0  - 16.0 g/dL Final  . HCT 11/11/2014 40.6  35.0 - 47.0 % Final  . MCV 11/11/2014 83.9  80.0 - 100.0 fL Final  . MCH 11/11/2014 27.3  26.0 - 34.0 pg Final  . MCHC 11/11/2014 32.5  32.0 - 36.0 g/dL Final  . RDW 11/11/2014 16.3* 11.5 - 14.5 % Final  . Platelets 11/11/2014 288  150 - 440 K/uL Final  . Neutrophils Relative % 11/11/2014 79   Final  . Neutro Abs 11/11/2014 5.7  1.4 - 6.5 K/uL Final  . Lymphocytes Relative 11/11/2014 6   Final  . Lymphs Abs 11/11/2014 0.5* 1.0 - 3.6 K/uL Final  . Monocytes Relative 11/11/2014 12   Final  . Monocytes Absolute 11/11/2014 0.9  0.2 - 0.9 K/uL Final  . Eosinophils Relative 11/11/2014 2   Final  . Eosinophils Absolute 11/11/2014 0.2  0 - 0.7 K/uL Final  . Basophils Relative 11/11/2014 1   Final  . Basophils Absolute 11/11/2014 0.1  0 - 0.1 K/uL Final  . Sodium 11/11/2014 135  135 - 145 mmol/L Final  . Potassium 11/11/2014 3.9  3.5 - 5.1 mmol/L Final  . Chloride 11/11/2014 96* 101 - 111 mmol/L Final  . CO2 11/11/2014 29  22 - 32 mmol/L Final  . Glucose, Bld 11/11/2014 257* 65 - 99 mg/dL Final  . BUN 11/11/2014 13  6 - 20 mg/dL Final  . Creatinine, Ser 11/11/2014 0.56  0.44 - 1.00 mg/dL Final  . Calcium 11/11/2014 9.7  8.9 - 10.3 mg/dL Final  . Total Protein 11/11/2014 6.8  6.5 - 8.1 g/dL Final  . Albumin 11/11/2014 3.5  3.5 - 5.0 g/dL Final  . AST 11/11/2014 23  15 - 41 U/L Final  . ALT 11/11/2014 12* 14 - 54 U/L Final  . Alkaline Phosphatase 11/11/2014 75  38 - 126 U/L Final  . Total Bilirubin 11/11/2014 0.8  0.3 - 1.2 mg/dL Final  . GFR calc non Af Amer 11/11/2014 >60  >60 mL/min Final  . GFR calc Af Amer 11/11/2014 >60  >60 mL/min Final   Comment: (NOTE) The eGFR has been calculated using the CKD EPI equation. This calculation has not been validated in all clinical situations. eGFR's persistently <60 mL/min signify  possible Chronic Kidney Disease.   . Anion gap 11/11/2014 10  5 - 15 Final       STUDIES: Ct Chest W  Contrast  11/10/2014  CLINICAL DATA:  Lung cancer, stage IIIB, status post radiation and chemotherapy EXAM: CT CHEST WITH CONTRAST TECHNIQUE: Multidetector CT imaging of the chest was performed during intravenous contrast administration. CONTRAST:  50m OMNIPAQUE IOHEXOL 300 MG/ML  SOLN COMPARISON:  PET-CT dated 06/21/2014.  CT chest dated 06/18/2014. FINDINGS: Mediastinum/Nodes: The heart is normal in size. No pericardial effusion. Mild coronary atherosclerosis in the LAD. Atherosclerotic calcifications of the aortic root. Mixed response of left thoracic lymphadenopathy, as follows: --18 mm short axis left supraclavicular node (series 2/image 1), previously 14 mm --9 mm short axis left prevascular node (series 2/image 12), previously 18 mm --12 mm short axis left paratracheal node (series 2/image 19), previously 14 mm --13 mm short axis left hilar node (series 2/ image 28), previously 10 mm Visualized thyroid is unremarkable. Lungs/Pleura: 3.0 x 1.9 cm mass in the medial right lower lobe/retrohilar region (series 3/ image 27), previously 5.0 x 3.8 cm. 4.4 x 5.9 cm medial left lower lobe mass (series 3/ image 33), previously an ill-defined 2.4 x 2.3 cm opacity. Underlying moderate centrilobular and paraseptal emphysematous changes, upper lobe predominant. No focal consolidation. No pleural effusion or pneumothorax. Upper abdomen: Visualized upper abdomen is unremarkable. Musculoskeletal: Degenerative changes of the visualized thoracolumbar spine. Probable bone island in the anterior T11 vertebral body (sagittal image 93). IMPRESSION: 3.0 cm mass in the medial right lower lobe/retrohilar region, suspicious for primary bronchogenic neoplasm, mildly decreased. 4.4 x 5.9 cm medial left lower lobe mass, suspicious for primary bronchogenic neoplasm versus metastasis, increased. Mixed response of thoracic lymphadenopathy, including a 1.8 cm left supraclavicular node which has mildly progressed. Electronically Signed   By:  SJulian HyM.D.   On: 11/10/2014 10:31    ASSESSMENT: Squamous cell carcinoma of lung right lower lobe positive mediastinal lymph node.  4 x 5 cm left lower lobe mass supraclavicular lymph node Stage IV disease  MEDICAL DECISION MAKING:  I went over this CT scan independently and discussed that with the patient.  During my discussion I suggested that all the treatments are palliative in nature I would prefer to start patient on chemotherapy and hold off radiation on the left lower lobe mass and left supraclavicular lymphadenopathy if there is poor response to chemotherapy or palliation of symptoms Patient has a strong argument again seen using Decadron so patient has been approved for using Abraxane Carboplatinum and Abraxane Abraxane on day 1 and 8 basis to avoid some of the toxicity Can be tried and after 3 cycles of treatment reassessment can be done   Intent of chemotherapy is palliation and relief in symptoms and extending survival All the side effects of chemotherapy including myelosuppression, alopecia, nausea vomiting fatigue weakness.  Secondary infection, and   peripheral neuropathy .  Has been discussed in details. Informal consent has been obtained and will be documented by nurses in the chart Patient is going to think about all these options and will let him know Total duration of visit was 45 minutes.  50% or more time was spent in counseling patient and family regarding prognosis and options of treatment and available resources  Patient was encouraged to use oxygen at home Weight   control issue had been discussed Diabetes being controlled by primary care physician   Patient expressed understanding and was in agreement with this plan. She also  understands that She can call clinic at any time with any questions, concerns, or complaints.    No matching staging information was found for the patient.  Forest Gleason, MD   11/12/2014 4:44 PM

## 2014-11-12 ENCOUNTER — Encounter: Payer: Self-pay | Admitting: Oncology

## 2014-11-17 ENCOUNTER — Inpatient Hospital Stay: Payer: PPO

## 2014-11-17 ENCOUNTER — Ambulatory Visit: Payer: PPO | Admitting: Oncology

## 2014-11-17 VITALS — BP 108/70 | HR 103 | Temp 98.5°F | Resp 20

## 2014-11-17 DIAGNOSIS — C3432 Malignant neoplasm of lower lobe, left bronchus or lung: Secondary | ICD-10-CM | POA: Diagnosis not present

## 2014-11-17 DIAGNOSIS — C3491 Malignant neoplasm of unspecified part of right bronchus or lung: Secondary | ICD-10-CM

## 2014-11-17 MED ORDER — PALONOSETRON HCL INJECTION 0.25 MG/5ML
0.2500 mg | Freq: Once | INTRAVENOUS | Status: AC
  Administered 2014-11-17: 0.25 mg via INTRAVENOUS
  Filled 2014-11-17: qty 5

## 2014-11-17 MED ORDER — PACLITAXEL PROTEIN-BOUND CHEMO INJECTION 100 MG
90.0000 mg/m2 | Freq: Once | INTRAVENOUS | Status: AC
  Administered 2014-11-17: 225 mg via INTRAVENOUS
  Filled 2014-11-17: qty 45

## 2014-11-17 MED ORDER — SODIUM CHLORIDE 0.9 % IV SOLN
Freq: Once | INTRAVENOUS | Status: AC
  Administered 2014-11-17: 12:00:00 via INTRAVENOUS
  Filled 2014-11-17: qty 1000

## 2014-11-17 MED ORDER — SODIUM CHLORIDE 0.9 % IV SOLN
630.0000 mg | Freq: Once | INTRAVENOUS | Status: AC
  Administered 2014-11-17: 630 mg via INTRAVENOUS
  Filled 2014-11-17: qty 63

## 2014-11-22 ENCOUNTER — Ambulatory Visit
Admission: RE | Admit: 2014-11-22 | Discharge: 2014-11-22 | Disposition: A | Discharge status: Home / Self Care | Payer: PPO | Source: Ambulatory Visit | Attending: Radiation Oncology | Admitting: Radiation Oncology

## 2014-11-22 ENCOUNTER — Ambulatory Visit: Payer: PPO | Admitting: Radiation Oncology

## 2014-11-24 ENCOUNTER — Other Ambulatory Visit: Payer: PPO

## 2014-12-01 ENCOUNTER — Inpatient Hospital Stay (HOSPITAL_BASED_OUTPATIENT_CLINIC_OR_DEPARTMENT_OTHER): Payer: PPO | Admitting: Oncology

## 2014-12-01 ENCOUNTER — Encounter: Payer: Self-pay | Admitting: Oncology

## 2014-12-01 ENCOUNTER — Inpatient Hospital Stay: Payer: PPO

## 2014-12-01 VITALS — BP 131/77 | HR 103 | Temp 97.6°F | Wt 276.2 lb

## 2014-12-01 DIAGNOSIS — C7802 Secondary malignant neoplasm of left lung: Secondary | ICD-10-CM

## 2014-12-01 DIAGNOSIS — C3491 Malignant neoplasm of unspecified part of right bronchus or lung: Secondary | ICD-10-CM

## 2014-12-01 DIAGNOSIS — R590 Localized enlarged lymph nodes: Secondary | ICD-10-CM | POA: Diagnosis not present

## 2014-12-01 DIAGNOSIS — E669 Obesity, unspecified: Secondary | ICD-10-CM

## 2014-12-01 DIAGNOSIS — C7801 Secondary malignant neoplasm of right lung: Secondary | ICD-10-CM | POA: Diagnosis not present

## 2014-12-01 DIAGNOSIS — E119 Type 2 diabetes mellitus without complications: Secondary | ICD-10-CM

## 2014-12-01 DIAGNOSIS — Z87891 Personal history of nicotine dependence: Secondary | ICD-10-CM

## 2014-12-01 DIAGNOSIS — R000 Tachycardia, unspecified: Secondary | ICD-10-CM

## 2014-12-01 DIAGNOSIS — Z8673 Personal history of transient ischemic attack (TIA), and cerebral infarction without residual deficits: Secondary | ICD-10-CM

## 2014-12-01 DIAGNOSIS — F329 Major depressive disorder, single episode, unspecified: Secondary | ICD-10-CM

## 2014-12-01 DIAGNOSIS — F419 Anxiety disorder, unspecified: Secondary | ICD-10-CM

## 2014-12-01 DIAGNOSIS — C3490 Malignant neoplasm of unspecified part of unspecified bronchus or lung: Secondary | ICD-10-CM

## 2014-12-01 DIAGNOSIS — Z794 Long term (current) use of insulin: Secondary | ICD-10-CM

## 2014-12-01 DIAGNOSIS — C3432 Malignant neoplasm of lower lobe, left bronchus or lung: Secondary | ICD-10-CM | POA: Diagnosis not present

## 2014-12-01 DIAGNOSIS — Z923 Personal history of irradiation: Secondary | ICD-10-CM

## 2014-12-01 DIAGNOSIS — R0609 Other forms of dyspnea: Secondary | ICD-10-CM

## 2014-12-01 DIAGNOSIS — I2510 Atherosclerotic heart disease of native coronary artery without angina pectoris: Secondary | ICD-10-CM

## 2014-12-01 DIAGNOSIS — Z7984 Long term (current) use of oral hypoglycemic drugs: Secondary | ICD-10-CM

## 2014-12-01 DIAGNOSIS — Z79899 Other long term (current) drug therapy: Secondary | ICD-10-CM

## 2014-12-01 LAB — CBC WITH DIFFERENTIAL/PLATELET
BASOS ABS: 0 10*3/uL (ref 0–0.1)
Basophils Relative: 0 %
Eosinophils Absolute: 0.1 10*3/uL (ref 0–0.7)
Eosinophils Relative: 1 %
HEMATOCRIT: 39.4 % (ref 35.0–47.0)
HEMOGLOBIN: 12.7 g/dL (ref 12.0–16.0)
LYMPHS PCT: 7 %
Lymphs Abs: 0.4 10*3/uL — ABNORMAL LOW (ref 1.0–3.6)
MCH: 27.4 pg (ref 26.0–34.0)
MCHC: 32.2 g/dL (ref 32.0–36.0)
MCV: 84.9 fL (ref 80.0–100.0)
MONO ABS: 0.7 10*3/uL (ref 0.2–0.9)
Monocytes Relative: 12 %
NEUTROS ABS: 4.8 10*3/uL (ref 1.4–6.5)
NEUTROS PCT: 80 %
Platelets: 217 10*3/uL (ref 150–440)
RBC: 4.64 MIL/uL (ref 3.80–5.20)
RDW: 16.2 % — ABNORMAL HIGH (ref 11.5–14.5)
WBC: 6 10*3/uL (ref 3.6–11.0)

## 2014-12-01 LAB — COMPREHENSIVE METABOLIC PANEL
ALT: 16 U/L (ref 14–54)
AST: 20 U/L (ref 15–41)
Albumin: 3.9 g/dL (ref 3.5–5.0)
Alkaline Phosphatase: 68 U/L (ref 38–126)
Anion gap: 8 (ref 5–15)
BILIRUBIN TOTAL: 1.2 mg/dL (ref 0.3–1.2)
BUN: 13 mg/dL (ref 6–20)
CO2: 30 mmol/L (ref 22–32)
CREATININE: 0.5 mg/dL (ref 0.44–1.00)
Calcium: 9.9 mg/dL (ref 8.9–10.3)
Chloride: 96 mmol/L — ABNORMAL LOW (ref 101–111)
GFR calc Af Amer: >60 mL/min (ref >60)
GLUCOSE: 201 mg/dL — AB (ref 65–99)
Potassium: 4.2 mmol/L (ref 3.5–5.1)
Sodium: 134 mmol/L — ABNORMAL LOW (ref 135–145)
TOTAL PROTEIN: 7.3 g/dL (ref 6.5–8.1)

## 2014-12-01 MED ORDER — SODIUM CHLORIDE 0.9 % IV SOLN
Freq: Once | INTRAVENOUS | Status: AC
  Administered 2014-12-01: 10:00:00 via INTRAVENOUS
  Filled 2014-12-01: qty 1000

## 2014-12-01 MED ORDER — SODIUM CHLORIDE 0.9 % IV SOLN
Freq: Once | INTRAVENOUS | Status: AC
  Administered 2014-12-01: 10:00:00 via INTRAVENOUS
  Filled 2014-12-01: qty 8

## 2014-12-01 MED ORDER — PACLITAXEL PROTEIN-BOUND CHEMO INJECTION 100 MG
90.0000 mg/m2 | Freq: Once | INTRAVENOUS | Status: AC
  Administered 2014-12-01: 225 mg via INTRAVENOUS
  Filled 2014-12-01: qty 45

## 2014-12-01 NOTE — Progress Notes (Signed)
Sherry Mueller (MR# [id number])     [place] @ [facility] [phone]  Fax:(336) [place] OB: [birth year]  MR#: [id number]  FOY#:[id number]  Patient Care Team: Arnetha Courser, MD as PCP - General (Family Medicine) Arnetha Courser, MD (Family Medicine)  CHIEF COMPLAINT:  Chief Complaint  Patient presents with  . Lung Cancer    1.  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 Status post radiation therapy but patient did not have any significant amount of chemotherapy only one dose of carboplatinum has patient declined chemotherapy along with radiation treatment because of side effect of radiation therapy Diagnosis in July of 2016 2.  Progressive disease in the left lower lobe on recent CT scan of November 2 016 Patient is starting carboplatinum and Abraxane (November, 2016)  Oncology Flowsheet 07/08/2014 09/10/2014 11/17/2014  Day, Cycle - Day 1, Cycle 1 Day 1, Cycle 1  CARBOplatin (PARAPLATIN) IV - 250 mg 630 mg  dexamethasone (DECADRON) IJ - - -  ondansetron (ZOFRAN) IV - [ 16 mg ] -  PACLitaxel-protein bound (ABRAXANE) IV - - 90 mg/m2  palonosetron (ALOXI) IV - - 0.25 mg    INTERVAL HISTORY:  74 year old lady with moderate obesity and insulin-dependent diabetes came today further follow-up has shortness of breath on exertion patient is using oxygen at home.  Patient had a repeat CT scan be sows progressive disease in the left lower lobe the area which was initially suspected to be pneumonia.  Right lower lobe mass and the hilar adenopathy has responded to radiation therapy. Patient is somewhat depressed.  Here for ongoing evaluation and treatment consideration No chest pain.  Dry hacking cough.  No hemoptysis.  Patient tolerated chemotherapy very well.  Here for day 15 chemotherapy with Abraxane.  No tingling.  No numbness.  No  nausea.  No vomiting.  No diarrhea. REVIEW OF SYSTEMS:   Gen. status: No chills or fever.  Shortness of breath on exertion.  Lungs: Dry hacking cough.  No hemoptysis or chest pain Cardiac: No chest discomfort or paroxysmal nocturnal dyspnea GI: No nausea no vomiting no diarrhea GU: No dysuria hematuria Lower extremity intermittent swelling neurological system no headache no dizziness skin: No rash HEENT: No difficulty swallowing All other systems have been reviewed  As per HPI. Otherwise, a complete review of systems is negatve.  PAST MEDICAL HISTORY: Past Medical History  Diagnosis Date  . Stroke ([place])   . Hyperlipidemia   . Cancer ([place])   . Hypertension   . COPD (chronic obstructive pulmonary disease) ([place])   . Shortness of breath dyspnea   . Diabetes mellitus without complication ([place])   . Depression   . Anxiety   . Headache     PAST SURGICAL HISTORY: Past Surgical History  Procedure Laterality Date  . Breast surgery Left cyst removed  . Cardiac catheterization    . Colonoscopy    . Endobronchial ultrasound Right 07/08/2014    Procedure: ENDOBRONCHIAL ULTRASOUND;  Surgeon: Vilinda Boehringer, MD;  Location: [facility] ORS;  Service: Cardiopulmonary;  Laterality: Right;    FAMILY HISTORY Family History  Problem Relation Age of Onset  . Heart disease Mother   . Hypertension Mother   . Heart disease Father   . Stroke Father     ADVANCED DIRECTIVES:  No flowsheet data found.  HEALTH MAINTENANCE: Social History  Substance Use Topics  . Smoking status: Former Smoker -- 2.00 packs/day    Types: Cigarettes    Quit date: 06/14/2004  . Smokeless tobacco: Never Used  . Alcohol Use: No      Allergies  Allergen Reactions  . Byetta 10 Mcg Pen [Exenatide] Nausea Only  . Hctz [Hydrochlorothiazide] Nausea Only  . Penicillins Itching  . Plavix [Clopidogrel] Nausea Only  . Plavix [Clopidogrel] Nausea And Vomiting    Current Outpatient Prescriptions  Medication Sig Dispense  Refill  . albuterol-ipratropium (COMBIVENT) 18-103 MCG/ACT inhaler Inhale 2 puffs into the lungs every 4 (four) hours as needed for wheezing or shortness of breath.    Marland Kitchen amLODipine (NORVASC) 5 MG tablet TAKE 1 TABLET BY MOUTH EVERY DAY 30 tablet 3  . B Complex Vitamins (B COMPLEX PO) Take 1 tablet by mouth every morning.    . benazepril (LOTENSIN) 20 MG tablet Take 1 tablet (20 mg total) by mouth daily. 90 tablet 3  . busPIRone (BUSPAR) 10 MG tablet Take 10 mg by mouth 2 (two) times daily as needed.    . Cholecalciferol (VITAMIN D3) 2000 UNITS TABS Take 1 tablet by mouth every morning.    . citalopram (CELEXA) 40 MG tablet TAKE 1 TABLET BY MOUTH AT BEDTIME 30 tablet 2  . co-enzyme Q-10 30 MG capsule Take 30 mg by mouth daily.    Marland Kitchen dexamethasone (DECADRON) 4 MG tablet Take 1 tablet (4 mg total) by mouth daily. Take 1 tablet daily for 10 days, then take 1 tablet every other day until finished. 18 tablet 0  . fluticasone (FLONASE) 50 MCG/ACT nasal spray Place 2 sprays into both nostrils daily. 15.8 g 11  . Fluticasone-Salmeterol (ADVAIR DISKUS) 250-50 MCG/DOSE AEPB Inhale 1 puff into the lungs as needed.    Marland Kitchen glimepiride (AMARYL) 4 MG tablet TAKE 1 TABLET BY MOUTH EVERY DAY 30 tablet 5  . glucose blood test strip OneTouch Ultra test strips; check glucose 3x a day; LON 99 months; E11.65 100 each 11  . glucose monitoring kit (FREESTYLE) monitoring kit 1 each by Does not apply route as needed for other.    . guaiFENesin-codeine 100-10 MG/5ML syrup Take 5 mLs by mouth 3 (three) times daily as needed for cough. 120 mL 2  . Insulin Detemir (LEVEMIR FLEXPEN) 100 UNIT/ML Pen Inject 10 Units into the skin daily at 10 pm. Increase by three units every three days until blood sugars are less than 180-200 15 mL 5  . Insulin Pen Needle 31G X 5 MM MISC Use with insulin pen to inject insulin under the skin once a day 100 each 1  . Ipratropium-Albuterol (COMBIVENT RESPIMAT) 20-100 MCG/ACT AERS respimat Inhale 1 puff  into the lungs every 6 (six) hours.    . isosorbide mononitrate (IMDUR) 30 MG 24 hr tablet TAKE 1 TABLET BY MOUTH EVERY DAY 30 tablet 5  . lansoprazole (PREVACID) 30 MG capsule Take 1 capsule (30 mg total) by mouth daily at 12 noon. 30 capsule 2  . metFORMIN (GLUCOPHAGE-XR) 500 MG 24 hr tablet Take 2 tablets (1,000 mg total) by mouth 2 (two) times daily. Take 2 tablets twice a day 360 tablet 1  . Multiple Vitamins-Minerals (MULTIVITAMIN PO) Take 1 tablet by mouth 2 (two) times daily.    . Omega-3 Fatty Acids (FISH OIL PO) Take 1,000 mg by mouth every morning.    . ondansetron (ZOFRAN) 4 MG tablet Take 1 tablet (4 mg total) by mouth every 6 (six) hours as needed  for nausea or vomiting. 30 tablet 3  . pravastatin (PRAVACHOL) 80 MG tablet TAKE 1 TABLET BY MOUTH AT BEDTIME 30 tablet 2   No current facility-administered medications for this visit.    OBJECTIVE:  Filed Vitals:   12/01/14 0905  BP: 131/77  Pulse: 103  Temp: 97.6 F (36.4 C)     Body mass index is 44.34 kg/(m^2).    ECOG FS:1 - Symptomatic but completely ambulatory  PHYSICAL EXAM: Gen. status: Moderately of obese lady in mild distress because of exertion.  Oxygen never dropped to 87% on exertion Lungs: Diminished air entry on both sides.  No rhonchi or rales Cardiac: Tachycardia Lymphatic system: Supraclavicular, cervical, axillary, inguinal lymph nodes are not palpable Abdominal exam revealed normal bowel sounds. The abdomen was soft, non-tender, and without masses, organomegaly, or appreciable enlargement of the abdominal aorta. Examination of the skin revealed no evidence of significant rashes, suspicious appearing nevi or other concerning lesions. Neurologically, the patient was awake, alert, and oriented to person, place and time. There were no obvious focal neurologic abnormalities. Skin no skeletal system no bony pain   LAB RESULTS:  CBC Latest Ref Rng 12/01/2014 11/11/2014  WBC 3.6 - 11.0 K/uL 6.0 7.3  Hemoglobin  12.0 - 16.0 g/dL 12.7 13.2  Hematocrit 35.0 - 47.0 % 39.4 40.6  Platelets 150 - 440 K/uL 217 288    Appointment on 12/01/2014  Component Date Value Ref Range Status  . WBC 12/01/2014 6.0  3.6 - 11.0 K/uL Final  . RBC 12/01/2014 4.64  3.80 - 5.20 MIL/uL Final  . Hemoglobin 12/01/2014 12.7  12.0 - 16.0 g/dL Final  . HCT 12/01/2014 39.4  35.0 - 47.0 % Final  . MCV 12/01/2014 84.9  80.0 - 100.0 fL Final  . MCH 12/01/2014 27.4  26.0 - 34.0 pg Final  . MCHC 12/01/2014 32.2  32.0 - 36.0 g/dL Final  . RDW 12/01/2014 16.2* 11.5 - 14.5 % Final  . Platelets 12/01/2014 217  150 - 440 K/uL Final  . Neutrophils Relative % 12/01/2014 80   Final  . Neutro Abs 12/01/2014 4.8  1.4 - 6.5 K/uL Final  . Lymphocytes Relative 12/01/2014 7   Final  . Lymphs Abs 12/01/2014 0.4* 1.0 - 3.6 K/uL Final  . Monocytes Relative 12/01/2014 12   Final  . Monocytes Absolute 12/01/2014 0.7  0.2 - 0.9 K/uL Final  . Eosinophils Relative 12/01/2014 1   Final  . Eosinophils Absolute 12/01/2014 0.1  0 - 0.7 K/uL Final  . Basophils Relative 12/01/2014 0   Final  . Basophils Absolute 12/01/2014 0.0  0 - 0.1 K/uL Final  . Sodium 12/01/2014 134* 135 - 145 mmol/L Final  . Potassium 12/01/2014 4.2  3.5 - 5.1 mmol/L Final  . Chloride 12/01/2014 96* 101 - 111 mmol/L Final  . CO2 12/01/2014 30  22 - 32 mmol/L Final  . Glucose, Bld 12/01/2014 201* 65 - 99 mg/dL Final  . BUN 12/01/2014 13  6 - 20 mg/dL Final  . Creatinine, Ser 12/01/2014 0.50  0.44 - 1.00 mg/dL Final  . Calcium 12/01/2014 9.9  8.9 - 10.3 mg/dL Final  . Total Protein 12/01/2014 7.3  6.5 - 8.1 g/dL Final  . Albumin 12/01/2014 3.9  3.5 - 5.0 g/dL Final  . AST 12/01/2014 20  15 - 41 U/L Final  . ALT 12/01/2014 16  14 - 54 U/L Final  . Alkaline Phosphatase 12/01/2014 68  38 - 126 U/L Final  . Total Bilirubin 12/01/2014 1.2  0.3 - 1.2 mg/dL Final  . GFR calc non Af Amer 12/01/2014 >60  >60 mL/min Final  . GFR calc Af Amer 12/01/2014 >60  >60 mL/min Final   Comment:  (NOTE) The eGFR has been calculated using the CKD EPI equation. This calculation has not been validated in all clinical situations. eGFR's persistently <60 mL/min signify possible Chronic Kidney Disease.   . Anion gap 12/01/2014 8  5 - 15 Final       STUDIES: Ct Chest W Contrast  11/10/2014  CLINICAL DATA:  Lung cancer, stage IIIB, status post radiation and chemotherapy EXAM: CT CHEST WITH CONTRAST TECHNIQUE: Multidetector CT imaging of the chest was performed during intravenous contrast administration. CONTRAST:  55m OMNIPAQUE IOHEXOL 300 MG/ML  SOLN COMPARISON:  PET-CT dated 06/21/2014.  CT chest dated 06/18/2014. FINDINGS: Mediastinum/Nodes: The heart is normal in size. No pericardial effusion. Mild coronary atherosclerosis in the LAD. Atherosclerotic calcifications of the aortic root. Mixed response of left thoracic lymphadenopathy, as follows: --18 mm short axis left supraclavicular node (series 2/image 1), previously 14 mm --9 mm short axis left prevascular node (series 2/image 12), previously 18 mm --12 mm short axis left paratracheal node (series 2/image 19), previously 14 mm --13 mm short axis left hilar node (series 2/ image 28), previously 10 mm Visualized thyroid is unremarkable. Lungs/Pleura: 3.0 x 1.9 cm mass in the medial right lower lobe/retrohilar region (series 3/ image 27), previously 5.0 x 3.8 cm. 4.4 x 5.9 cm medial left lower lobe mass (series 3/ image 33), previously an ill-defined 2.4 x 2.3 cm opacity. Underlying moderate centrilobular and paraseptal emphysematous changes, upper lobe predominant. No focal consolidation. No pleural effusion or pneumothorax. Upper abdomen: Visualized upper abdomen is unremarkable. Musculoskeletal: Degenerative changes of the visualized thoracolumbar spine. Probable bone island in the anterior T11 vertebral body (sagittal image 93). IMPRESSION: 3.0 cm mass in the medial right lower lobe/retrohilar region, suspicious for primary bronchogenic  neoplasm, mildly decreased. 4.4 x 5.9 cm medial left lower lobe mass, suspicious for primary bronchogenic neoplasm versus metastasis, increased. Mixed response of thoracic lymphadenopathy, including a 1.8 cm left supraclavicular node which has mildly progressed. Electronically Signed   By: SJulian HyM.D.   On: 11/10/2014 10:31    ASSESSMENT: Squamous cell carcinoma of lung right lower lobe positive mediastinal lymph node.  4 x 5 cm left lower lobe mass supraclavicular lymph node Stage IV disease  MEDICAL DECISION MAKING:  All lab data has been reviewed.\Minimal side effect Continue chemotherapy Patient's diabetes according to her is well controlled  Patient expressed understanding and was in agreement with this plan. She also understands that She can call clinic at any time with any questions, concerns, or complaints.    No matching staging information was found for the patient.  JForest Gleason MD   12/01/2014 9:22 AM

## 2014-12-08 ENCOUNTER — Inpatient Hospital Stay (HOSPITAL_BASED_OUTPATIENT_CLINIC_OR_DEPARTMENT_OTHER): Payer: PPO | Admitting: Oncology

## 2014-12-08 ENCOUNTER — Inpatient Hospital Stay: Payer: PPO | Attending: Oncology

## 2014-12-08 ENCOUNTER — Inpatient Hospital Stay: Payer: PPO

## 2014-12-08 VITALS — BP 142/79 | HR 122 | Temp 98.2°F | Resp 20 | Wt 276.0 lb

## 2014-12-08 DIAGNOSIS — B0229 Other postherpetic nervous system involvement: Secondary | ICD-10-CM | POA: Diagnosis not present

## 2014-12-08 DIAGNOSIS — Z7984 Long term (current) use of oral hypoglycemic drugs: Secondary | ICD-10-CM | POA: Insufficient documentation

## 2014-12-08 DIAGNOSIS — Z9981 Dependence on supplemental oxygen: Secondary | ICD-10-CM

## 2014-12-08 DIAGNOSIS — E785 Hyperlipidemia, unspecified: Secondary | ICD-10-CM | POA: Diagnosis not present

## 2014-12-08 DIAGNOSIS — K5900 Constipation, unspecified: Secondary | ICD-10-CM | POA: Insufficient documentation

## 2014-12-08 DIAGNOSIS — I10 Essential (primary) hypertension: Secondary | ICD-10-CM

## 2014-12-08 DIAGNOSIS — F329 Major depressive disorder, single episode, unspecified: Secondary | ICD-10-CM | POA: Insufficient documentation

## 2014-12-08 DIAGNOSIS — I2510 Atherosclerotic heart disease of native coronary artery without angina pectoris: Secondary | ICD-10-CM | POA: Insufficient documentation

## 2014-12-08 DIAGNOSIS — C3431 Malignant neoplasm of lower lobe, right bronchus or lung: Secondary | ICD-10-CM

## 2014-12-08 DIAGNOSIS — E669 Obesity, unspecified: Secondary | ICD-10-CM | POA: Diagnosis not present

## 2014-12-08 DIAGNOSIS — Z5111 Encounter for antineoplastic chemotherapy: Secondary | ICD-10-CM | POA: Insufficient documentation

## 2014-12-08 DIAGNOSIS — Z923 Personal history of irradiation: Secondary | ICD-10-CM | POA: Diagnosis not present

## 2014-12-08 DIAGNOSIS — R0602 Shortness of breath: Secondary | ICD-10-CM | POA: Diagnosis not present

## 2014-12-08 DIAGNOSIS — C7802 Secondary malignant neoplasm of left lung: Secondary | ICD-10-CM | POA: Diagnosis not present

## 2014-12-08 DIAGNOSIS — R05 Cough: Secondary | ICD-10-CM | POA: Insufficient documentation

## 2014-12-08 DIAGNOSIS — E119 Type 2 diabetes mellitus without complications: Secondary | ICD-10-CM | POA: Diagnosis not present

## 2014-12-08 DIAGNOSIS — C3491 Malignant neoplasm of unspecified part of right bronchus or lung: Secondary | ICD-10-CM

## 2014-12-08 DIAGNOSIS — R21 Rash and other nonspecific skin eruption: Secondary | ICD-10-CM | POA: Insufficient documentation

## 2014-12-08 DIAGNOSIS — Z7901 Long term (current) use of anticoagulants: Secondary | ICD-10-CM | POA: Insufficient documentation

## 2014-12-08 DIAGNOSIS — Z87891 Personal history of nicotine dependence: Secondary | ICD-10-CM | POA: Insufficient documentation

## 2014-12-08 DIAGNOSIS — Z79899 Other long term (current) drug therapy: Secondary | ICD-10-CM

## 2014-12-08 DIAGNOSIS — C779 Secondary and unspecified malignant neoplasm of lymph node, unspecified: Secondary | ICD-10-CM | POA: Diagnosis not present

## 2014-12-08 DIAGNOSIS — Z8673 Personal history of transient ischemic attack (TIA), and cerebral infarction without residual deficits: Secondary | ICD-10-CM

## 2014-12-08 DIAGNOSIS — F419 Anxiety disorder, unspecified: Secondary | ICD-10-CM

## 2014-12-08 DIAGNOSIS — J449 Chronic obstructive pulmonary disease, unspecified: Secondary | ICD-10-CM

## 2014-12-08 DIAGNOSIS — C3490 Malignant neoplasm of unspecified part of unspecified bronchus or lung: Secondary | ICD-10-CM

## 2014-12-08 LAB — COMPREHENSIVE METABOLIC PANEL
ALT: 14 U/L (ref 14–54)
ANION GAP: 7 (ref 5–15)
AST: 17 U/L (ref 15–41)
Albumin: 4 g/dL (ref 3.5–5.0)
Alkaline Phosphatase: 71 U/L (ref 38–126)
BILIRUBIN TOTAL: 0.7 mg/dL (ref 0.3–1.2)
BUN: 17 mg/dL (ref 6–20)
CALCIUM: 9.6 mg/dL (ref 8.9–10.3)
CO2: 29 mmol/L (ref 22–32)
Chloride: 98 mmol/L — ABNORMAL LOW (ref 101–111)
Creatinine, Ser: 0.54 mg/dL (ref 0.44–1.00)
GFR calc Af Amer: >60 mL/min (ref >60)
Glucose, Bld: 210 mg/dL — ABNORMAL HIGH (ref 65–99)
POTASSIUM: 3.9 mmol/L (ref 3.5–5.1)
Sodium: 134 mmol/L — ABNORMAL LOW (ref 135–145)
TOTAL PROTEIN: 7.1 g/dL (ref 6.5–8.1)

## 2014-12-08 LAB — CBC WITH DIFFERENTIAL/PLATELET
BASOS ABS: 0.1 10*3/uL (ref 0–0.1)
Basophils Relative: 1 %
Eosinophils Absolute: 0.1 10*3/uL (ref 0–0.7)
Eosinophils Relative: 1 %
HEMATOCRIT: 39.2 % (ref 35.0–47.0)
Hemoglobin: 12.9 g/dL (ref 12.0–16.0)
LYMPHS ABS: 0.6 10*3/uL — AB (ref 1.0–3.6)
LYMPHS PCT: 7 %
MCH: 28.1 pg (ref 26.0–34.0)
MCHC: 32.8 g/dL (ref 32.0–36.0)
MCV: 85.8 fL (ref 80.0–100.0)
MONO ABS: 0.5 10*3/uL (ref 0.2–0.9)
MONOS PCT: 6 %
NEUTROS ABS: 7.3 10*3/uL — AB (ref 1.4–6.5)
Neutrophils Relative %: 85 %
Platelets: 213 10*3/uL (ref 150–440)
RBC: 4.57 MIL/uL (ref 3.80–5.20)
RDW: 16.4 % — AB (ref 11.5–14.5)
WBC: 8.5 10*3/uL (ref 3.6–11.0)

## 2014-12-08 LAB — MAGNESIUM: MAGNESIUM: 1.4 mg/dL — AB (ref 1.7–2.4)

## 2014-12-08 MED ORDER — SODIUM CHLORIDE 0.9 % IV SOLN
Freq: Once | INTRAVENOUS | Status: AC
  Administered 2014-12-08: 11:00:00 via INTRAVENOUS
  Filled 2014-12-08: qty 1000

## 2014-12-08 MED ORDER — PALONOSETRON HCL INJECTION 0.25 MG/5ML
0.2500 mg | Freq: Once | INTRAVENOUS | Status: AC
  Administered 2014-12-08: 0.25 mg via INTRAVENOUS
  Filled 2014-12-08: qty 5

## 2014-12-08 MED ORDER — SODIUM CHLORIDE 0.9 % IV SOLN
630.0000 mg | Freq: Once | INTRAVENOUS | Status: AC
  Administered 2014-12-08: 630 mg via INTRAVENOUS
  Filled 2014-12-08: qty 63

## 2014-12-08 MED ORDER — PACLITAXEL PROTEIN-BOUND CHEMO INJECTION 100 MG
90.0000 mg/m2 | Freq: Once | INTRAVENOUS | Status: AC
  Administered 2014-12-08: 225 mg via INTRAVENOUS
  Filled 2014-12-08: qty 45

## 2014-12-08 NOTE — Progress Notes (Signed)
Patient states she is having pain around the upper part of her abdomen.

## 2014-12-09 ENCOUNTER — Encounter: Payer: Self-pay | Admitting: Oncology

## 2014-12-09 ENCOUNTER — Other Ambulatory Visit: Payer: Self-pay | Admitting: Family Medicine

## 2014-12-09 ENCOUNTER — Other Ambulatory Visit: Payer: Self-pay | Admitting: *Deleted

## 2014-12-09 DIAGNOSIS — C3491 Malignant neoplasm of unspecified part of right bronchus or lung: Secondary | ICD-10-CM

## 2014-12-09 NOTE — Progress Notes (Signed)
Sherry Mueller (MR# [id number])     [place] @ [facility] Telephone:([phone]) [phone]  Fax:(336) [place] OB: January 09, 1940  MR#: [id number]  ACZ#:[id number]  Patient Care Team: Arnetha Courser, MD as PCP - General (Family Medicine) Arnetha Courser, MD (Family Medicine)  CHIEF COMPLAINT:  Chief Complaint  Patient presents with  . Lung Cancer    1.  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 Status post radiation therapy but patient did not have any significant amount of chemotherapy only one dose of carboplatinum has patient declined chemotherapy along with radiation treatment because of side effect of radiation therapy Diagnosis in July of 2016 2.  Progressive disease in the left lower lobe on recent CT scan of November 2 016 Patient is starting carboplatinum and Abraxane (November, 2016)  Oncology Flowsheet 07/08/2014 09/10/2014 11/17/2014 12/01/2014 12/08/2014  Day, Cycle - Day 1, Cycle 1 Day 1, Cycle 1 1, Cycle 1 Day 1, Cycle 2  CARBOplatin (PARAPLATIN) IV - 250 mg 630 mg - 630 mg  dexamethasone (DECADRON) IJ - - - - -  ondansetron (ZOFRAN) IV - [ 16 mg ] - [ 16 mg ] -  PACLitaxel-protein bound (ABRAXANE) IV - - 90 mg/m2 90 mg/m2 90 mg/m2  palonosetron (ALOXI) IV - - 0.25 mg - 0.25 mg    INTERVAL HISTORY:  74 year old lady with moderate obesity and insulin-dependent diabetes came today further follow-up has shortness of breath on exertion patient is using oxygen at home.  Patient had a repeat CT scan be sows progressive disease in the left lower lobe the area which was initially suspected to be pneumonia.  Right lower lobe mass and the hilar adenopathy has responded to radiation therapy. Patient is somewhat depressed.  Here for ongoing evaluation and treatment consideration No chest pain.  Dry hacking cough.  No hemoptysis.   Patient is starting new  cycle of chemotherapy with carboplatinum and Abraxane.  Tolerating treatment very well.  Continues to gain weight.  Patient's blood sugar remains a problem.  No tingling.  No numbness.  Hacking cough.  Shortness of breath on exertion. REVIEW OF SYSTEMS:   Gen. status: No chills or fever.  Shortness of breath on exertion.  Lungs: Dry hacking cough.  No hemoptysis or chest pain Cardiac: No chest discomfort or paroxysmal nocturnal dyspnea GI: No nausea no vomiting no diarrhea GU: No dysuria hematuria Lower extremity intermittent swelling neurological system no headache no dizziness skin: No rash HEENT: No difficulty swallowing All other systems have been reviewed  As per HPI. Otherwise, a complete review of systems is negatve.  PAST MEDICAL HISTORY: Past Medical History  Diagnosis Date  . Stroke ([place])   . Hyperlipidemia   . Cancer ([place])   . Hypertension   . COPD (chronic obstructive pulmonary disease) ([place])   . Shortness of breath dyspnea   . Diabetes mellitus without complication ([place])   . Depression   . Anxiety   . Headache     PAST SURGICAL HISTORY: Past Surgical History  Procedure Laterality Date  . Breast surgery Left cyst removed  . Cardiac catheterization    . Colonoscopy    . Endobronchial ultrasound Right 07/08/2014    Procedure: ENDOBRONCHIAL ULTRASOUND;  Surgeon: Vilinda Boehringer, MD;  Location: [facility] ORS;  Service: Cardiopulmonary;  Laterality: Right;    FAMILY HISTORY Family History  Problem  Relation Age of Onset  . Heart disease Mother   . Hypertension Mother   . Heart disease Father   . Stroke Father     ADVANCED DIRECTIVES:  No flowsheet data found.  HEALTH MAINTENANCE: Social History  Substance Use Topics  . Smoking status: Former Smoker -- 2.00 packs/day    Types: Cigarettes    Quit date: 06/14/2004  . Smokeless tobacco: Never Used  . Alcohol Use: No      Allergies  Allergen Reactions  . Byetta 10 Mcg Pen [Exenatide] Nausea Only  . Hctz  [Hydrochlorothiazide] Nausea Only  . Penicillins Itching  . Plavix [Clopidogrel] Nausea Only  . Plavix [Clopidogrel] Nausea And Vomiting    Current Outpatient Prescriptions  Medication Sig Dispense Refill  . albuterol-ipratropium (COMBIVENT) 18-103 MCG/ACT inhaler Inhale 2 puffs into the lungs every 4 (four) hours as needed for wheezing or shortness of breath.    Marland Kitchen amLODipine (NORVASC) 5 MG tablet TAKE 1 TABLET BY MOUTH EVERY DAY 30 tablet 3  . B Complex Vitamins (B COMPLEX PO) Take 1 tablet by mouth every morning.    . benazepril (LOTENSIN) 20 MG tablet Take 1 tablet (20 mg total) by mouth daily. 90 tablet 3  . busPIRone (BUSPAR) 10 MG tablet Take 10 mg by mouth 2 (two) times daily as needed.    . Cholecalciferol (VITAMIN D3) 2000 UNITS TABS Take 1 tablet by mouth every morning.    . citalopram (CELEXA) 40 MG tablet TAKE 1 TABLET BY MOUTH AT BEDTIME 30 tablet 2  . co-enzyme Q-10 30 MG capsule Take 30 mg by mouth daily.    Marland Kitchen dexamethasone (DECADRON) 4 MG tablet Take 1 tablet (4 mg total) by mouth daily. Take 1 tablet daily for 10 days, then take 1 tablet every other day until finished. 18 tablet 0  . fluticasone (FLONASE) 50 MCG/ACT nasal spray Place 2 sprays into both nostrils daily. 15.8 g 11  . Fluticasone-Salmeterol (ADVAIR DISKUS) 250-50 MCG/DOSE AEPB Inhale 1 puff into the lungs as needed.    Marland Kitchen glimepiride (AMARYL) 4 MG tablet TAKE 1 TABLET BY MOUTH EVERY DAY 30 tablet 5  . glucose blood test strip OneTouch Ultra test strips; check glucose 3x a day; LON 99 months; E11.65 100 each 11  . glucose monitoring kit (FREESTYLE) monitoring kit 1 each by Does not apply route as needed for other.    . guaiFENesin-codeine 100-10 MG/5ML syrup Take 5 mLs by mouth 3 (three) times daily as needed for cough. 120 mL 2  . Insulin Detemir (LEVEMIR FLEXPEN) 100 UNIT/ML Pen Inject 10 Units into the skin daily at 10 pm. Increase by three units every three days until blood sugars are less than 180-200 15 mL 5    . Insulin Pen Needle 31G X 5 MM MISC Use with insulin pen to inject insulin under the skin once a day 100 each 1  . Ipratropium-Albuterol (COMBIVENT RESPIMAT) 20-100 MCG/ACT AERS respimat Inhale 1 puff into the lungs every 6 (six) hours.    . isosorbide mononitrate (IMDUR) 30 MG 24 hr tablet TAKE 1 TABLET BY MOUTH EVERY DAY 30 tablet 5  . lansoprazole (PREVACID) 30 MG capsule Take 1 capsule (30 mg total) by mouth daily at 12 noon. 30 capsule 2  . metFORMIN (GLUCOPHAGE-XR) 500 MG 24 hr tablet Take 2 tablets (1,000 mg total) by mouth 2 (two) times daily. Take 2 tablets twice a day 360 tablet 1  . Multiple Vitamins-Minerals (MULTIVITAMIN PO) Take 1 tablet by mouth 2 (  two) times daily.    . Omega-3 Fatty Acids (FISH OIL PO) Take 1,000 mg by mouth every morning.    . ondansetron (ZOFRAN) 4 MG tablet Take 1 tablet (4 mg total) by mouth every 6 (six) hours as needed for nausea or vomiting. 30 tablet 3  . pravastatin (PRAVACHOL) 80 MG tablet TAKE 1 TABLET BY MOUTH AT BEDTIME 30 tablet 2   No current facility-administered medications for this visit.    OBJECTIVE:  Filed Vitals:   12/08/14 1033  BP: 142/79  Pulse: 122  Temp: 98.2 F (36.8 C)  Resp: 20     Body mass index is 44.31 kg/(m^2).    ECOG FS:1 - Symptomatic but completely ambulatory  PHYSICAL EXAM: Gen. status: Moderately of obese lady in mild distress because of exertion.  Oxygen never dropped to 87% on exertion Lungs: Diminished air entry on both sides.  No rhonchi or rales Cardiac: Tachycardia Lymphatic system: Supraclavicular, cervical, axillary, inguinal lymph nodes are not palpable Abdominal exam revealed normal bowel sounds. The abdomen was soft, non-tender, and without masses, organomegaly, or appreciable enlargement of the abdominal aorta. Examination of the skin revealed no evidence of significant rashes, suspicious appearing nevi or other concerning lesions. Neurologically, the patient was awake, alert, and oriented to  person, place and time. There were no obvious focal neurologic abnormalities. Skin no skeletal system no bony pain   LAB RESULTS:  CBC Latest Ref Rng 12/08/2014 12/01/2014  WBC 3.6 - 11.0 K/uL 8.5 6.0  Hemoglobin 12.0 - 16.0 g/dL 12.9 12.7  Hematocrit 35.0 - 47.0 % 39.2 39.4  Platelets 150 - 440 K/uL 213 217    Appointment on 12/08/2014  Component Date Value Ref Range Status  . WBC 12/08/2014 8.5  3.6 - 11.0 K/uL Final  . RBC 12/08/2014 4.57  3.80 - 5.20 MIL/uL Final  . Hemoglobin 12/08/2014 12.9  12.0 - 16.0 g/dL Final  . HCT 12/08/2014 39.2  35.0 - 47.0 % Final  . MCV 12/08/2014 85.8  80.0 - 100.0 fL Final  . MCH 12/08/2014 28.1  26.0 - 34.0 pg Final  . MCHC 12/08/2014 32.8  32.0 - 36.0 g/dL Final  . RDW 12/08/2014 16.4* 11.5 - 14.5 % Final  . Platelets 12/08/2014 213  150 - 440 K/uL Final  . Neutrophils Relative % 12/08/2014 85   Final  . Neutro Abs 12/08/2014 7.3* 1.4 - 6.5 K/uL Final  . Lymphocytes Relative 12/08/2014 7   Final  . Lymphs Abs 12/08/2014 0.6* 1.0 - 3.6 K/uL Final  . Monocytes Relative 12/08/2014 6   Final  . Monocytes Absolute 12/08/2014 0.5  0.2 - 0.9 K/uL Final  . Eosinophils Relative 12/08/2014 1   Final  . Eosinophils Absolute 12/08/2014 0.1  0 - 0.7 K/uL Final  . Basophils Relative 12/08/2014 1   Final  . Basophils Absolute 12/08/2014 0.1  0 - 0.1 K/uL Final  . Sodium 12/08/2014 134* 135 - 145 mmol/L Final  . Potassium 12/08/2014 3.9  3.5 - 5.1 mmol/L Final  . Chloride 12/08/2014 98* 101 - 111 mmol/L Final  . CO2 12/08/2014 29  22 - 32 mmol/L Final  . Glucose, Bld 12/08/2014 210* 65 - 99 mg/dL Final  . BUN 12/08/2014 17  6 - 20 mg/dL Final  . Creatinine, Ser 12/08/2014 0.54  0.44 - 1.00 mg/dL Final  . Calcium 12/08/2014 9.6  8.9 - 10.3 mg/dL Final  . Total Protein 12/08/2014 7.1  6.5 - 8.1 g/dL Final  . Albumin 12/08/2014 4.0  3.5 - 5.0 g/dL Final  . AST 12/08/2014 17  15 - 41 U/L Final  . ALT 12/08/2014 14  14 - 54 U/L Final  . Alkaline  Phosphatase 12/08/2014 71  38 - 126 U/L Final  . Total Bilirubin 12/08/2014 0.7  0.3 - 1.2 mg/dL Final  . GFR calc non Af Amer 12/08/2014 >60  >60 mL/min Final  . GFR calc Af Amer 12/08/2014 >60  >60 mL/min Final   Comment: (NOTE) The eGFR has been calculated using the CKD EPI equation. This calculation has not been validated in all clinical situations. eGFR's persistently <60 mL/min signify possible Chronic Kidney Disease.   . Anion gap 12/08/2014 7  5 - 15 Final  . Magnesium 12/08/2014 1.4* 1.7 - 2.4 mg/dL Final       STUDIES: Ct Chest W Contrast  11/10/2014  CLINICAL DATA:  Lung cancer, stage IIIB, status post radiation and chemotherapy EXAM: CT CHEST WITH CONTRAST TECHNIQUE: Multidetector CT imaging of the chest was performed during intravenous contrast administration. CONTRAST:  54m OMNIPAQUE IOHEXOL 300 MG/ML  SOLN COMPARISON:  PET-CT dated 06/21/2014.  CT chest dated 06/18/2014. FINDINGS: Mediastinum/Nodes: The heart is normal in size. No pericardial effusion. Mild coronary atherosclerosis in the LAD. Atherosclerotic calcifications of the aortic root. Mixed response of left thoracic lymphadenopathy, as follows: --18 mm short axis left supraclavicular node (series 2/image 1), previously 14 mm --9 mm short axis left prevascular node (series 2/image 12), previously 18 mm --12 mm short axis left paratracheal node (series 2/image 19), previously 14 mm --13 mm short axis left hilar node (series 2/ image 28), previously 10 mm Visualized thyroid is unremarkable. Lungs/Pleura: 3.0 x 1.9 cm mass in the medial right lower lobe/retrohilar region (series 3/ image 27), previously 5.0 x 3.8 cm. 4.4 x 5.9 cm medial left lower lobe mass (series 3/ image 33), previously an ill-defined 2.4 x 2.3 cm opacity. Underlying moderate centrilobular and paraseptal emphysematous changes, upper lobe predominant. No focal consolidation. No pleural effusion or pneumothorax. Upper abdomen: Visualized upper abdomen is  unremarkable. Musculoskeletal: Degenerative changes of the visualized thoracolumbar spine. Probable bone island in the anterior T11 vertebral body (sagittal image 93). IMPRESSION: 3.0 cm mass in the medial right lower lobe/retrohilar region, suspicious for primary bronchogenic neoplasm, mildly decreased. 4.4 x 5.9 cm medial left lower lobe mass, suspicious for primary bronchogenic neoplasm versus metastasis, increased. Mixed response of thoracic lymphadenopathy, including a 1.8 cm left supraclavicular node which has mildly progressed. Electronically Signed   By: SJulian HyM.D.   On: 11/10/2014 10:31    ASSESSMENT: Squamous cell carcinoma of lung right lower lobe positive mediastinal lymph node.  4 x 5 cm left lower lobe mass supraclavicular lymph node Stage IV disease  MEDICAL DECISION MAKING:  All lab data has been reviewed. Proceed with chemotherapy Review the blood sugar level Discussed weight reduction Magnesium is low and will continue IV magnesium supplement Patient expressed understanding and was in agreement with this plan. She also understands that She can call clinic at any time with any questions, concerns, or complaints.    No matching staging information was found for the patient.  JForest Gleason MD   12/09/2014 7:58 AM

## 2014-12-09 NOTE — Telephone Encounter (Signed)
rx

## 2014-12-09 NOTE — Telephone Encounter (Signed)
LFTs on 12/08/14 reviewed; rx approved

## 2014-12-15 ENCOUNTER — Inpatient Hospital Stay: Payer: PPO

## 2014-12-15 ENCOUNTER — Telehealth: Payer: Self-pay | Admitting: *Deleted

## 2014-12-15 ENCOUNTER — Inpatient Hospital Stay (HOSPITAL_BASED_OUTPATIENT_CLINIC_OR_DEPARTMENT_OTHER): Payer: PPO | Admitting: Oncology

## 2014-12-15 VITALS — BP 109/75 | HR 125 | Resp 20 | Wt 279.1 lb

## 2014-12-15 DIAGNOSIS — C7802 Secondary malignant neoplasm of left lung: Secondary | ICD-10-CM | POA: Diagnosis not present

## 2014-12-15 DIAGNOSIS — C779 Secondary and unspecified malignant neoplasm of lymph node, unspecified: Secondary | ICD-10-CM | POA: Diagnosis not present

## 2014-12-15 DIAGNOSIS — Z87891 Personal history of nicotine dependence: Secondary | ICD-10-CM

## 2014-12-15 DIAGNOSIS — C3491 Malignant neoplasm of unspecified part of right bronchus or lung: Secondary | ICD-10-CM | POA: Diagnosis not present

## 2014-12-15 DIAGNOSIS — Z8673 Personal history of transient ischemic attack (TIA), and cerebral infarction without residual deficits: Secondary | ICD-10-CM

## 2014-12-15 DIAGNOSIS — F329 Major depressive disorder, single episode, unspecified: Secondary | ICD-10-CM

## 2014-12-15 DIAGNOSIS — Z5111 Encounter for antineoplastic chemotherapy: Secondary | ICD-10-CM | POA: Diagnosis not present

## 2014-12-15 DIAGNOSIS — E669 Obesity, unspecified: Secondary | ICD-10-CM | POA: Diagnosis not present

## 2014-12-15 DIAGNOSIS — I2510 Atherosclerotic heart disease of native coronary artery without angina pectoris: Secondary | ICD-10-CM

## 2014-12-15 DIAGNOSIS — E119 Type 2 diabetes mellitus without complications: Secondary | ICD-10-CM

## 2014-12-15 DIAGNOSIS — Z9981 Dependence on supplemental oxygen: Secondary | ICD-10-CM

## 2014-12-15 DIAGNOSIS — I10 Essential (primary) hypertension: Secondary | ICD-10-CM

## 2014-12-15 DIAGNOSIS — R0602 Shortness of breath: Secondary | ICD-10-CM

## 2014-12-15 DIAGNOSIS — Z79899 Other long term (current) drug therapy: Secondary | ICD-10-CM

## 2014-12-15 DIAGNOSIS — Z7984 Long term (current) use of oral hypoglycemic drugs: Secondary | ICD-10-CM

## 2014-12-15 DIAGNOSIS — J449 Chronic obstructive pulmonary disease, unspecified: Secondary | ICD-10-CM

## 2014-12-15 DIAGNOSIS — E785 Hyperlipidemia, unspecified: Secondary | ICD-10-CM

## 2014-12-15 DIAGNOSIS — F419 Anxiety disorder, unspecified: Secondary | ICD-10-CM

## 2014-12-15 DIAGNOSIS — Z923 Personal history of irradiation: Secondary | ICD-10-CM

## 2014-12-15 DIAGNOSIS — C3490 Malignant neoplasm of unspecified part of unspecified bronchus or lung: Secondary | ICD-10-CM

## 2014-12-15 DIAGNOSIS — R05 Cough: Secondary | ICD-10-CM

## 2014-12-15 DIAGNOSIS — Z7901 Long term (current) use of anticoagulants: Secondary | ICD-10-CM

## 2014-12-15 LAB — CBC WITH DIFFERENTIAL/PLATELET
BASOS PCT: 0 %
Basophils Absolute: 0 10*3/uL (ref 0–0.1)
Eosinophils Absolute: 0 10*3/uL (ref 0–0.7)
Eosinophils Relative: 1 %
HEMATOCRIT: 35.9 % (ref 35.0–47.0)
HEMOGLOBIN: 11.7 g/dL — AB (ref 12.0–16.0)
LYMPHS ABS: 0.2 10*3/uL — AB (ref 1.0–3.6)
Lymphocytes Relative: 7 %
MCH: 27.4 pg (ref 26.0–34.0)
MCHC: 32.6 g/dL (ref 32.0–36.0)
MCV: 84 fL (ref 80.0–100.0)
MONOS PCT: 7 %
Monocytes Absolute: 0.2 10*3/uL (ref 0.2–0.9)
NEUTROS ABS: 2.9 10*3/uL (ref 1.4–6.5)
NEUTROS PCT: 85 %
Platelets: 124 10*3/uL — ABNORMAL LOW (ref 150–440)
RBC: 4.27 MIL/uL (ref 3.80–5.20)
RDW: 16.6 % — ABNORMAL HIGH (ref 11.5–14.5)
WBC: 3.4 10*3/uL — AB (ref 3.6–11.0)

## 2014-12-15 LAB — MAGNESIUM: MAGNESIUM: 0.7 mg/dL — AB (ref 1.7–2.4)

## 2014-12-15 MED ORDER — VALACYCLOVIR HCL 1 G PO TABS: 1000.0000 mg | ORAL_TABLET | Freq: Two times a day (BID) | ORAL | Status: DC

## 2014-12-15 MED ORDER — MAGNESIUM CHLORIDE 64 MG PO TBEC
1.0000 | DELAYED_RELEASE_TABLET | Freq: Two times a day (BID) | ORAL | Status: DC
Start: 2014-12-15 — End: 2015-01-10

## 2014-12-15 MED ORDER — TRAMADOL HCL 50 MG PO TABS: 50.0000 mg | ORAL_TABLET | Freq: Four times a day (QID) | ORAL | Status: DC | PRN

## 2014-12-15 NOTE — Telephone Encounter (Signed)
Called to say that she is cancelling her chemo appt for today as she has developed a rash on her buttocks which is getting worse. It started 2 days ago.

## 2014-12-15 NOTE — Progress Notes (Signed)
MD notified of mag 0.7. Discussed with patient regarding need to give IV mag. Pt did not want to stay to get IV infusion due to having to wait. MD recommends for patient to stay to get IV mag. Informed pt of risks of leaving without mag infusion today. Offered to schedule pt the next day to get IV mag but only wanted to keep appt next week on 12/21. Pt was persistent on leaving without infusion today and wanted to go home to take magnesium tablet and "eat nuts." instructed pt that if developed chest pains and difficulty breathing that needed to report to ED immediately. Pt verbalized understanding of risks involved leaving clinic without mag infusion. MD aware of pt's decision.

## 2014-12-15 NOTE — Progress Notes (Signed)
Patient here as acute add on for rash on right buttock and on right thigh.  States it prevents her from sleeping at night.    Patient's 02 was 84% when she walked from the lobby into the exam room.  Instructed patient to take slow, deep breaths and inhale through her nose and out of her mouth.  O2 increased to 91%.  During assessment O2 dropped to 84% and HR registered @ 250.  Applied 4L O2 and saturation increased to 94%.  Decreased O2 to 2L.  Advised patient to use wheelchair when leaving exam room and she refused. Brought wheelchair to room and convinced her to use it.  Patient's magnesium 0.7 and patient advised to stay for infusion of 4g mag.  Patient refused and refused to come tomorrow, stating she will keep her appointment next week.  Hayley, RN in the room with [place] at this point and instructed patient to go to ED in the event she has any problems - ie chest pains. Patient verbalized understanding.

## 2014-12-15 NOTE — Telephone Encounter (Signed)
Per Dr Jeb Levering, pt to come in to be seen, will cancel chemo. Pt agrees to come in for her lab and to see md

## 2014-12-17 ENCOUNTER — Telehealth: Payer: Self-pay | Admitting: *Deleted

## 2014-12-17 ENCOUNTER — Ambulatory Visit: Payer: PPO | Admitting: Family Medicine

## 2014-12-17 ENCOUNTER — Encounter: Payer: Self-pay | Admitting: Oncology

## 2014-12-17 NOTE — Telephone Encounter (Signed)
Do not use any lotions or topical ointments and let the area dry out. Get the Tramadol on board and call us back this afternoon if no better. I called and discussed this with son and he said she just took her first Tramadol and will call back at 2:00 to let me know how she is doing

## 2014-12-17 NOTE — Progress Notes (Signed)
Sherry Mueller (MR# [id number])     [place] @ [facility] Telephone:(336) [phone]  Fax:(336) [place] OB: 10/03/1940  MR#: [id number]  RXV#:[id number]  Patient Care Team: Arnetha Courser, MD as PCP - General (Family Medicine) Arnetha Courser, MD (Family Medicine)  CHIEF COMPLAINT:  Chief Complaint  Patient presents with  . Acute Visit    1.  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 Status post radiation therapy but patient did not have any significant amount of chemotherapy only one dose of carboplatinum has patient declined chemotherapy along with radiation treatment because of side effect of radiation therapy Diagnosis in July of 2016 2.  Progressive disease in the left lower lobe on recent CT scan of November 2 016 Patient is starting carboplatinum and Abraxane (November, 2016)  Oncology Flowsheet 07/08/2014 09/10/2014 11/17/2014 12/01/2014 12/08/2014  Day, Cycle - Day 1, Cycle 1 Day 1, Cycle 1 1, Cycle 1 Day 1, Cycle 2  CARBOplatin (PARAPLATIN) IV - 250 mg 630 mg - 630 mg  dexamethasone (DECADRON) IJ - - - - -  ondansetron (ZOFRAN) IV - [ 16 mg ] - [ 16 mg ] -  PACLitaxel-protein bound (ABRAXANE) IV - - 90 mg/m2 90 mg/m2 90 mg/m2  palonosetron (ALOXI) IV - - 0.25 mg - 0.25 mg    INTERVAL HISTORY:  74 year old lady with moderate obesity and insulin-dependent diabetes came today further follow-up has shortness of breath on exertion patient is using oxygen at home.  Patient had a repeat CT scan be sows progressive disease in the left lower lobe the area which was initially suspected to be pneumonia.  Right lower lobe mass and the hilar adenopathy has responded to radiation therapy. Patient is somewhat depressed.  Here for ongoing evaluation and treatment consideration No chest pain.  Dry hacking cough.  No hemoptysis. december 2016 Patient came  today further follow-up as an acute add-on.  Patient was supposed to come today for getting blood tests done and get Abraxane chemotherapy.  However had developed blisters on the back.  Patient was examined and was found to have herpes zoster.  They seem was found to be 0.7  REVIEW OF SYSTEMS:   Gen. status: No chills or fever.  Shortness of breath on exertion.  Lungs: Dry hacking cough.  No hemoptysis or chest pain Cardiac: No chest discomfort or paroxysmal nocturnal dyspnea GI: No nausea no vomiting no diarrhea GU: No dysuria hematuria Lower extremity intermittent swelling neurological system no headache no dizziness skin: No rash HEENT: No difficulty swallowing  Skin: Rash on the back yet pain 4.   All other systems have been reviewed  As per HPI. Otherwise, a complete review of systems is negatve.  PAST MEDICAL HISTORY: Past Medical History  Diagnosis Date  . Stroke ([place])   . Hyperlipidemia   . Cancer ([place])   . Hypertension   . COPD (chronic obstructive pulmonary disease) ([place])   . Shortness of breath dyspnea   . Diabetes mellitus without complication ([place])   . Depression   . Anxiety   . Headache     PAST SURGICAL HISTORY: Past Surgical History  Procedure Laterality Date  . Breast surgery Left cyst removed  . Cardiac catheterization    . Colonoscopy    . Endobronchial ultrasound Right 07/08/2014    Procedure: ENDOBRONCHIAL ULTRASOUND;  Surgeon: Roxanne Mins  Mungal, MD;  Location: [facility] ORS;  Service: Cardiopulmonary;  Laterality: Right;    FAMILY HISTORY Family History  Problem Relation Age of Onset  . Heart disease Mother   . Hypertension Mother   . Heart disease Father   . Stroke Father     ADVANCED DIRECTIVES:  No flowsheet data found.  HEALTH MAINTENANCE: Social History  Substance Use Topics  . Smoking status: Former Smoker -- 2.00 packs/day    Types: Cigarettes    Quit date: 06/14/2004  . Smokeless tobacco: Never Used  . Alcohol Use: No      Allergies    Allergen Reactions  . Byetta 10 Mcg Pen [Exenatide] Nausea Only  . Hctz [Hydrochlorothiazide] Nausea Only  . Penicillins Itching  . Plavix [Clopidogrel] Nausea Only  . Plavix [Clopidogrel] Nausea And Vomiting    Current Outpatient Prescriptions  Medication Sig Dispense Refill  . albuterol-ipratropium (COMBIVENT) 18-103 MCG/ACT inhaler Inhale 2 puffs into the lungs every 4 (four) hours as needed for wheezing or shortness of breath.    Marland Kitchen amLODipine (NORVASC) 5 MG tablet TAKE 1 TABLET BY MOUTH EVERY DAY 30 tablet 3  . B Complex Vitamins (B COMPLEX PO) Take 1 tablet by mouth every morning.    . benazepril (LOTENSIN) 20 MG tablet Take 1 tablet (20 mg total) by mouth daily. 90 tablet 3  . busPIRone (BUSPAR) 10 MG tablet Take 10 mg by mouth 2 (two) times daily as needed.    . Cholecalciferol (VITAMIN D3) 2000 UNITS TABS Take 1 tablet by mouth every morning.    . citalopram (CELEXA) 40 MG tablet TAKE 1 TABLET BY MOUTH AT BEDTIME 30 tablet 2  . co-enzyme Q-10 30 MG capsule Take 30 mg by mouth daily.    Marland Kitchen dexamethasone (DECADRON) 4 MG tablet Take 1 tablet (4 mg total) by mouth daily. Take 1 tablet daily for 10 days, then take 1 tablet every other day until finished. 18 tablet 0  . fluticasone (FLONASE) 50 MCG/ACT nasal spray Place 2 sprays into both nostrils daily. 15.8 g 11  . Fluticasone-Salmeterol (ADVAIR DISKUS) 250-50 MCG/DOSE AEPB Inhale 1 puff into the lungs as needed.    Marland Kitchen glimepiride (AMARYL) 4 MG tablet TAKE 1 TABLET BY MOUTH EVERY DAY 30 tablet 5  . glucose blood test strip OneTouch Ultra test strips; check glucose 3x a day; LON 99 months; E11.65 100 each 11  . glucose monitoring kit (FREESTYLE) monitoring kit 1 each by Does not apply route as needed for other.    . guaiFENesin-codeine 100-10 MG/5ML syrup Take 5 mLs by mouth 3 (three) times daily as needed for cough. 120 mL 2  . Insulin Detemir (LEVEMIR FLEXPEN) 100 UNIT/ML Pen Inject 10 Units into the skin daily at 10 pm. Increase by  three units every three days until blood sugars are less than 180-200 15 mL 5  . Insulin Pen Needle 31G X 5 MM MISC Use with insulin pen to inject insulin under the skin once a day 100 each 1  . Ipratropium-Albuterol (COMBIVENT RESPIMAT) 20-100 MCG/ACT AERS respimat Inhale 1 puff into the lungs every 6 (six) hours.    . isosorbide mononitrate (IMDUR) 30 MG 24 hr tablet TAKE 1 TABLET BY MOUTH EVERY DAY 30 tablet 5  . lansoprazole (PREVACID) 30 MG capsule Take 1 capsule (30 mg total) by mouth daily at 12 noon. 30 capsule 2  . metFORMIN (GLUCOPHAGE-XR) 500 MG 24 hr tablet Take 2 tablets (1,000 mg total) by mouth 2 (two) times daily.  Take 2 tablets twice a day 360 tablet 1  . Multiple Vitamins-Minerals (MULTIVITAMIN PO) Take 1 tablet by mouth 2 (two) times daily.    . Omega-3 Fatty Acids (FISH OIL PO) Take 1,000 mg by mouth every morning.    . ondansetron (ZOFRAN) 4 MG tablet Take 1 tablet (4 mg total) by mouth every 6 (six) hours as needed for nausea or vomiting. 30 tablet 3  . pravastatin (PRAVACHOL) 80 MG tablet TAKE 1 TABLET BY MOUTH AT BEDTIME 30 tablet 2  . magnesium chloride (SLOW-MAG) 64 MG TBEC SR tablet Take 1 tablet (64 mg total) by mouth 2 (two) times daily. 30 tablet 0  . traMADol (ULTRAM) 50 MG tablet Take 1 tablet (50 mg total) by mouth every 6 (six) hours as needed. 30 tablet 0  . valACYclovir (VALTREX) 1000 MG tablet Take 1 tablet (1,000 mg total) by mouth 2 (two) times daily. 14 tablet 0   No current facility-administered medications for this visit.    OBJECTIVE:  Filed Vitals:   12/15/14 1210 12/15/14 1212  BP: 109/75   Pulse: 125   Resp:  20     Body mass index is 44.8 kg/(m^2).    ECOG FS:1 - Symptomatic but completely ambulatory  PHYSICAL EXAM: Gen. status: Moderately of obese lady in mild distress because of exertion.  Oxygen never dropped to 87% on exertion Lungs: Diminished air entry on both sides.  No rhonchi or rales Cardiac: Tachycardia Lymphatic system:  Supraclavicular, cervical, axillary, inguinal lymph nodes are not palpable Abdominal exam revealed normal bowel sounds. The abdomen was soft, non-tender, and without masses, organomegaly, or appreciable enlargement of the abdominal aorta. Examination of the skin revealed no evidence of significant rashes, suspicious appearing nevi or other concerning lesions. Neurologically, the patient was awake, alert, and oriented to person, place and time. There were no obvious focal neurologic abnormalities. Skin: Herpes zoster on the left lower back   LAB RESULTS:  CBC Latest Ref Rng 12/15/2014 12/08/2014  WBC 3.6 - 11.0 K/uL 3.4(L) 8.5  Hemoglobin 12.0 - 16.0 g/dL 11.7(L) 12.9  Hematocrit 35.0 - 47.0 % 35.9 39.2  Platelets 150 - 440 K/uL 124(L) 213    Appointment on 12/15/2014  Component Date Value Ref Range Status  . WBC 12/15/2014 3.4* 3.6 - 11.0 K/uL Final  . RBC 12/15/2014 4.27  3.80 - 5.20 MIL/uL Final  . Hemoglobin 12/15/2014 11.7* 12.0 - 16.0 g/dL Final  . HCT 12/15/2014 35.9  35.0 - 47.0 % Final  . MCV 12/15/2014 84.0  80.0 - 100.0 fL Final  . MCH 12/15/2014 27.4  26.0 - 34.0 pg Final  . MCHC 12/15/2014 32.6  32.0 - 36.0 g/dL Final  . RDW 12/15/2014 16.6* 11.5 - 14.5 % Final  . Platelets 12/15/2014 124* 150 - 440 K/uL Final  . Neutrophils Relative % 12/15/2014 85   Final  . Neutro Abs 12/15/2014 2.9  1.4 - 6.5 K/uL Final  . Lymphocytes Relative 12/15/2014 7   Final  . Lymphs Abs 12/15/2014 0.2* 1.0 - 3.6 K/uL Final  . Monocytes Relative 12/15/2014 7   Final  . Monocytes Absolute 12/15/2014 0.2  0.2 - 0.9 K/uL Final  . Eosinophils Relative 12/15/2014 1   Final  . Eosinophils Absolute 12/15/2014 0.0  0 - 0.7 K/uL Final  . Basophils Relative 12/15/2014 0   Final  . Basophils Absolute 12/15/2014 0.0  0 - 0.1 K/uL Final  . Magnesium 12/15/2014 0.7* 1.7 - 2.4 mg/dL Final   Comment: RESULTS VERIFIED  BY REPEAT TESTING CRITICAL RESULT CALLED TO, READ BACK BY AND VERIFIED WITH NYO TO  HAYLEY RHODE 12/15/14 1209        STUDIES: No results found.  ASSESSMENT: Squamous cell carcinoma of lung right lower lobe positive mediastinal lymph node.  4 x 5 cm left lower lobe mass . supraclavicular lymph node Stage IV disease  MEDICAL DECISION MAKING:  Patient wanted to hold off chemotherapy New onset of herpes zoster started on Valtrex 1 g by mouth twice a day for 7 days Patient also has hypomagnesemia. because of magnesium being 0.7 mg patient was advised to get 4 g of IV magnesium Patient refused to stay.  Patient was offered continuous infusion pump Patient diffuse.  Patient was started on oral Slow-Mag 64 mg by mouth twice a day but told the risk involving cardiac arrhythmias due to hypomagnesemia but patient refused to stay.  I will go to emergency room for treatment  Duration of visit is 25 minutes and 50% of time was spent discussing .  Risk of hypomagnesemia and need for IV magnesium No matching staging information was found for the patient.  Forest Gleason, MD   12/17/2014 8:41 AM                                     Sherry Mueller (MR# [id number])

## 2014-12-17 NOTE — Telephone Encounter (Signed)
Having pain in the shingles area which is continuing to spread and she is having pain. Son called back and stated that she did not know she has a rx for Tramadol which she has not picked up yet, bit will and is asking if there is any cream or lotion that can be prescribed to stop the burning sensation she is experiencing. He also reports that it is draining a yellow liquid form the open lesions and she is not able to sleep at night

## 2014-12-22 ENCOUNTER — Inpatient Hospital Stay: Payer: PPO

## 2014-12-22 ENCOUNTER — Encounter: Payer: Self-pay | Admitting: Oncology

## 2014-12-22 ENCOUNTER — Inpatient Hospital Stay (HOSPITAL_BASED_OUTPATIENT_CLINIC_OR_DEPARTMENT_OTHER): Payer: PPO | Admitting: Oncology

## 2014-12-22 VITALS — BP 142/85 | HR 116 | Temp 98.1°F | Resp 18 | Wt 278.7 lb

## 2014-12-22 DIAGNOSIS — C3431 Malignant neoplasm of lower lobe, right bronchus or lung: Secondary | ICD-10-CM

## 2014-12-22 DIAGNOSIS — Z79899 Other long term (current) drug therapy: Secondary | ICD-10-CM

## 2014-12-22 DIAGNOSIS — J449 Chronic obstructive pulmonary disease, unspecified: Secondary | ICD-10-CM

## 2014-12-22 DIAGNOSIS — Z9981 Dependence on supplemental oxygen: Secondary | ICD-10-CM

## 2014-12-22 DIAGNOSIS — C3491 Malignant neoplasm of unspecified part of right bronchus or lung: Secondary | ICD-10-CM

## 2014-12-22 DIAGNOSIS — C7802 Secondary malignant neoplasm of left lung: Secondary | ICD-10-CM | POA: Diagnosis not present

## 2014-12-22 DIAGNOSIS — E785 Hyperlipidemia, unspecified: Secondary | ICD-10-CM

## 2014-12-22 DIAGNOSIS — C779 Secondary and unspecified malignant neoplasm of lymph node, unspecified: Secondary | ICD-10-CM

## 2014-12-22 DIAGNOSIS — K5900 Constipation, unspecified: Secondary | ICD-10-CM

## 2014-12-22 DIAGNOSIS — Z8673 Personal history of transient ischemic attack (TIA), and cerebral infarction without residual deficits: Secondary | ICD-10-CM

## 2014-12-22 DIAGNOSIS — Z923 Personal history of irradiation: Secondary | ICD-10-CM

## 2014-12-22 DIAGNOSIS — I10 Essential (primary) hypertension: Secondary | ICD-10-CM

## 2014-12-22 DIAGNOSIS — B0229 Other postherpetic nervous system involvement: Secondary | ICD-10-CM

## 2014-12-22 DIAGNOSIS — Z5111 Encounter for antineoplastic chemotherapy: Secondary | ICD-10-CM | POA: Diagnosis not present

## 2014-12-22 DIAGNOSIS — R21 Rash and other nonspecific skin eruption: Secondary | ICD-10-CM

## 2014-12-22 DIAGNOSIS — Z7984 Long term (current) use of oral hypoglycemic drugs: Secondary | ICD-10-CM

## 2014-12-22 DIAGNOSIS — E669 Obesity, unspecified: Secondary | ICD-10-CM

## 2014-12-22 DIAGNOSIS — Z7901 Long term (current) use of anticoagulants: Secondary | ICD-10-CM

## 2014-12-22 DIAGNOSIS — I2510 Atherosclerotic heart disease of native coronary artery without angina pectoris: Secondary | ICD-10-CM

## 2014-12-22 DIAGNOSIS — F329 Major depressive disorder, single episode, unspecified: Secondary | ICD-10-CM

## 2014-12-22 DIAGNOSIS — R05 Cough: Secondary | ICD-10-CM

## 2014-12-22 DIAGNOSIS — R0602 Shortness of breath: Secondary | ICD-10-CM

## 2014-12-22 DIAGNOSIS — F419 Anxiety disorder, unspecified: Secondary | ICD-10-CM

## 2014-12-22 DIAGNOSIS — Z87891 Personal history of nicotine dependence: Secondary | ICD-10-CM

## 2014-12-22 DIAGNOSIS — C3490 Malignant neoplasm of unspecified part of unspecified bronchus or lung: Secondary | ICD-10-CM

## 2014-12-22 DIAGNOSIS — E119 Type 2 diabetes mellitus without complications: Secondary | ICD-10-CM

## 2014-12-22 LAB — CBC WITH DIFFERENTIAL/PLATELET
Basophils Absolute: 0 10*3/uL (ref 0–0.1)
Basophils Relative: 1 %
Eosinophils Absolute: 0.1 10*3/uL (ref 0–0.7)
Eosinophils Relative: 1 %
HEMATOCRIT: 37.3 % (ref 35.0–47.0)
HEMOGLOBIN: 12.2 g/dL (ref 12.0–16.0)
LYMPHS ABS: 0.5 10*3/uL — AB (ref 1.0–3.6)
LYMPHS PCT: 12 %
MCH: 28.1 pg (ref 26.0–34.0)
MCHC: 32.6 g/dL (ref 32.0–36.0)
MCV: 86.3 fL (ref 80.0–100.0)
MONO ABS: 0.5 10*3/uL (ref 0.2–0.9)
MONOS PCT: 12 %
NEUTROS ABS: 3.2 10*3/uL (ref 1.4–6.5)
NEUTROS PCT: 74 %
PLATELETS: 215 10*3/uL (ref 150–440)
RBC: 4.32 MIL/uL (ref 3.80–5.20)
RDW: 17.5 % — AB (ref 11.5–14.5)
WBC: 4.4 10*3/uL (ref 3.6–11.0)

## 2014-12-22 LAB — COMPREHENSIVE METABOLIC PANEL
ALBUMIN: 3.8 g/dL (ref 3.5–5.0)
ALT: 15 U/L (ref 14–54)
ANION GAP: 10 (ref 5–15)
AST: 17 U/L (ref 15–41)
Alkaline Phosphatase: 65 U/L (ref 38–126)
BUN: 11 mg/dL (ref 6–20)
CHLORIDE: 96 mmol/L — AB (ref 101–111)
CO2: 29 mmol/L (ref 22–32)
Calcium: 8.9 mg/dL (ref 8.9–10.3)
Creatinine, Ser: 0.49 mg/dL (ref 0.44–1.00)
GFR calc Af Amer: >60 mL/min (ref >60)
Glucose, Bld: 160 mg/dL — ABNORMAL HIGH (ref 65–99)
POTASSIUM: 3.3 mmol/L — AB (ref 3.5–5.1)
Sodium: 135 mmol/L (ref 135–145)
Total Bilirubin: 0.7 mg/dL (ref 0.3–1.2)
Total Protein: 7.1 g/dL (ref 6.5–8.1)

## 2014-12-22 LAB — MAGNESIUM: MAGNESIUM: 1.2 mg/dL — AB (ref 1.7–2.4)

## 2014-12-22 MED ORDER — SODIUM CHLORIDE 0.9 % IV SOLN
Freq: Once | INTRAVENOUS | Status: AC
  Administered 2014-12-22: 13:00:00 via INTRAVENOUS
  Filled 2014-12-22: qty 8

## 2014-12-22 MED ORDER — SODIUM CHLORIDE 0.9 % IV SOLN
Freq: Once | INTRAVENOUS | Status: AC
  Administered 2014-12-22: 11:00:00 via INTRAVENOUS
  Filled 2014-12-22: qty 1000

## 2014-12-22 MED ORDER — SODIUM CHLORIDE 0.9 % IV SOLN
Freq: Once | INTRAVENOUS | Status: AC
  Administered 2014-12-22: 11:00:00 via INTRAVENOUS
  Filled 2014-12-22: qty 250

## 2014-12-22 MED ORDER — HYDROCODONE-ACETAMINOPHEN 5-325 MG PO TABS: 1.0000 | ORAL_TABLET | Freq: Four times a day (QID) | ORAL | Status: DC | PRN

## 2014-12-22 MED ORDER — VALACYCLOVIR HCL 1 G PO TABS: 1000.0000 mg | ORAL_TABLET | Freq: Two times a day (BID) | ORAL | Status: DC

## 2014-12-22 MED ORDER — GABAPENTIN 300 MG PO CAPS: 300.0000 mg | ORAL_CAPSULE | Freq: Two times a day (BID) | ORAL | Status: DC

## 2014-12-22 MED ORDER — MAGNESIUM SULFATE 4 GM/100ML IV SOLN: 4.0000 g | Freq: Once | INTRAVENOUS | Status: DC

## 2014-12-22 MED ORDER — PACLITAXEL PROTEIN-BOUND CHEMO INJECTION 100 MG
90.0000 mg/m2 | Freq: Once | INTRAVENOUS | Status: AC
  Administered 2014-12-22: 225 mg via INTRAVENOUS
  Filled 2014-12-22: qty 45

## 2014-12-22 NOTE — Progress Notes (Signed)
Patient continues to have nerve pain from shingles.  States it radiates from her right leg into her hip and buttocks.  Rash still covers buttocks.  States pain is 5/10 and no relief even with pain medication.

## 2014-12-28 ENCOUNTER — Encounter: Payer: Self-pay | Admitting: Oncology

## 2014-12-28 NOTE — Progress Notes (Signed)
Sherry Mueller (MR# [id number])     [place] @ [facility] Telephone:(336) [phone]  Fax:(336) [place] OB: [birth year]  MR#: [id number]  ONG#:[id number]  Patient Care Team: Arnetha Courser, MD as PCP - General (Family Medicine) Arnetha Courser, MD (Family Medicine)  CHIEF COMPLAINT:  Chief Complaint  Patient presents with  . Lung Cancer    1.  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 Status post radiation therapy but patient did not have any significant amount of chemotherapy only one dose of carboplatinum has patient declined chemotherapy along with radiation treatment because of side effect of radiation therapy Diagnosis in July of 2016 2.  Progressive disease in the left lower lobe on recent CT scan of November 2 016 Patient is starting carboplatinum and Abraxane (November, 2016)   Oncology Flowsheet 07/08/2014 09/10/2014 11/17/2014 12/01/2014 12/08/2014 12/22/2014  Day, Cycle - Day 1, Cycle 1 Day 1, Cycle 1 1, Cycle 1 Day 1, Cycle 2 Day 8, Cycle 2  CARBOplatin (PARAPLATIN) IV - 250 mg 630 mg - 630 mg -  dexamethasone (DECADRON) IJ - - - - - -  ondansetron (ZOFRAN) IV - [ 16 mg ] - [ 16 mg ] - [ 16 mg ]  PACLitaxel-protein bound (ABRAXANE) IV - - 90 mg/m2 90 mg/m2 90 mg/m2 90 mg/m2  palonosetron (ALOXI) IV - - 0.25 mg - 0.25 mg -    INTERVAL HISTORY:  74 year old lady with moderate obesity and insulin-dependent diabetes came today further follow-up has shortness of breath on exertion patient is using oxygen at home.   Patient has herpes zoster on the back.  Lesions are still drying out.  Patient complains of severe pain. Here for continuation of day 15 Abraxane chemotherapy for carcinoma of lung Pain is constant dull aching.  Taking tramadol which is not helping      Patient continues to have nerve pain from shingles. States it radiates  from her right leg into her hip and buttocks. Rash still covers buttocks. States pain is 5/10 and no relief even with pain medication.    REVIEW OF SYSTEMS:   Gen. status: Patient is in mild distress because of herpetic neuralgia Feeling weak and tired.  No chills.  No fever. GI: No nausea no vomiting no diarrhea. GU: No dysuria or hematuria Skin: Herpetic rash in the left gluteal area is slowly drying out Lungs: Shortness of breath : No chest pain. Lower extremity no swelling neurological system no headache no dizziness   As per HPI. Otherwise, a complete review of systems is negatve.  PAST MEDICAL HISTORY: Past Medical History  Diagnosis Date  . Stroke ([place])   . Hyperlipidemia   . Cancer ([place])   . Hypertension   . COPD (chronic obstructive pulmonary disease) ([place])   . Shortness of breath dyspnea   . Diabetes mellitus without complication ([place])   . Depression   . Anxiety   . Headache     PAST SURGICAL HISTORY: Past Surgical History  Procedure Laterality Date  . Breast surgery Left cyst removed  . Cardiac catheterization    . Colonoscopy    . Endobronchial ultrasound Right 07/08/2014    Procedure: ENDOBRONCHIAL ULTRASOUND;  Surgeon: Vilinda Boehringer, MD;  Location: [facility] ORS;  Service: Cardiopulmonary;  Laterality: Right;    FAMILY HISTORY Family History  Problem Relation Age of Onset  .  Heart disease Mother   . Hypertension Mother   . Heart disease Father   . Stroke Father     ADVANCED DIRECTIVES:  No flowsheet data found.  HEALTH MAINTENANCE: Social History  Substance Use Topics  . Smoking status: Former Smoker -- 2.00 packs/day    Types: Cigarettes    Quit date: 06/14/2004  . Smokeless tobacco: Never Used  . Alcohol Use: No      Allergies  Allergen Reactions  . Byetta 10 Mcg Pen [Exenatide] Nausea Only  . Hctz [Hydrochlorothiazide] Nausea Only  . Penicillins Itching  . Plavix [Clopidogrel] Nausea Only  . Plavix [Clopidogrel] Nausea And Vomiting     Current Outpatient Prescriptions  Medication Sig Dispense Refill  . albuterol-ipratropium (COMBIVENT) 18-103 MCG/ACT inhaler Inhale 2 puffs into the lungs every 4 (four) hours as needed for wheezing or shortness of breath.    Marland Kitchen amLODipine (NORVASC) 5 MG tablet TAKE 1 TABLET BY MOUTH EVERY DAY 30 tablet 3  . B Complex Vitamins (B COMPLEX PO) Take 1 tablet by mouth every morning.    . benazepril (LOTENSIN) 20 MG tablet Take 1 tablet (20 mg total) by mouth daily. 90 tablet 3  . busPIRone (BUSPAR) 10 MG tablet Take 10 mg by mouth 2 (two) times daily as needed.    . Cholecalciferol (VITAMIN D3) 2000 UNITS TABS Take 1 tablet by mouth every morning.    . citalopram (CELEXA) 40 MG tablet TAKE 1 TABLET BY MOUTH AT BEDTIME 30 tablet 2  . co-enzyme Q-10 30 MG capsule Take 30 mg by mouth daily.    . fluticasone (FLONASE) 50 MCG/ACT nasal spray Place 2 sprays into both nostrils daily. 15.8 g 11  . Fluticasone-Salmeterol (ADVAIR DISKUS) 250-50 MCG/DOSE AEPB Inhale 1 puff into the lungs as needed.    Marland Kitchen glimepiride (AMARYL) 4 MG tablet TAKE 1 TABLET BY MOUTH EVERY DAY 30 tablet 5  . glucose blood test strip OneTouch Ultra test strips; check glucose 3x a day; LON 99 months; E11.65 100 each 11  . glucose monitoring kit (FREESTYLE) monitoring kit 1 each by Does not apply route as needed for other.    . guaiFENesin-codeine 100-10 MG/5ML syrup Take 5 mLs by mouth 3 (three) times daily as needed for cough. 120 mL 2  . Insulin Detemir (LEVEMIR FLEXPEN) 100 UNIT/ML Pen Inject 10 Units into the skin daily at 10 pm. Increase by three units every three days until blood sugars are less than 180-200 15 mL 5  . Insulin Pen Needle 31G X 5 MM MISC Use with insulin pen to inject insulin under the skin once a day 100 each 1  . Ipratropium-Albuterol (COMBIVENT RESPIMAT) 20-100 MCG/ACT AERS respimat Inhale 1 puff into the lungs every 6 (six) hours.    . isosorbide mononitrate (IMDUR) 30 MG 24 hr tablet TAKE 1 TABLET BY MOUTH  EVERY DAY 30 tablet 5  . lansoprazole (PREVACID) 30 MG capsule Take 1 capsule (30 mg total) by mouth daily at 12 noon. 30 capsule 2  . magnesium chloride (SLOW-MAG) 64 MG TBEC SR tablet Take 1 tablet (64 mg total) by mouth 2 (two) times daily. 30 tablet 0  . metFORMIN (GLUCOPHAGE-XR) 500 MG 24 hr tablet Take 2 tablets (1,000 mg total) by mouth 2 (two) times daily. Take 2 tablets twice a day 360 tablet 1  . Multiple Vitamins-Minerals (MULTIVITAMIN PO) Take 1 tablet by mouth 2 (two) times daily.    . Omega-3 Fatty Acids (FISH OIL PO) Take 1,000 mg by  mouth every morning.    . ondansetron (ZOFRAN) 4 MG tablet Take 1 tablet (4 mg total) by mouth every 6 (six) hours as needed for nausea or vomiting. 30 tablet 3  . pravastatin (PRAVACHOL) 80 MG tablet TAKE 1 TABLET BY MOUTH AT BEDTIME 30 tablet 2  . valACYclovir (VALTREX) 1000 MG tablet Take 1 tablet (1,000 mg total) by mouth 2 (two) times daily. 14 tablet 0  . gabapentin (NEURONTIN) 300 MG capsule Take 1 capsule (300 mg total) by mouth 2 (two) times daily. 60 capsule 0  . HYDROcodone-acetaminophen (NORCO/VICODIN) 5-325 MG tablet Take 1 tablet by mouth every 6 (six) hours as needed for moderate pain. 30 tablet 0  . traMADol (ULTRAM) 50 MG tablet Take 50 mg by mouth every 6 (six) hours as needed.  0   No current facility-administered medications for this visit.    OBJECTIVE:  Filed Vitals:   12/22/14 0959  BP: 142/85  Pulse: 116  Temp: 98.1 F (36.7 C)  Resp: 18     Body mass index is 44.73 kg/(m^2).    ECOG FS:1 - Symptomatic but completely ambulatory  PHYSICAL EXAM: Gen. status: Moderately of obese lady in mild distress because of exertion.  Oxygen never dropped to 87% on exertion Lungs: Diminished air entry on both sides.  No rhonchi or rales Cardiac: Tachycardia Lymphatic system: Supraclavicular, cervical, axillary, inguinal lymph nodes are not palpable Abdominal exam revealed normal bowel sounds. The abdomen was soft, non-tender, and  without masses, organomegaly, or appreciable enlargement of the abdominal aorta. Examination of the skin revealed no evidence of significant rashes, suspicious appearing nevi or other concerning lesions. Neurologically, the patient was awake, alert, and oriented to person, place and time. There were no obvious focal neurologic abnormalities. Skin: Herpes zoster on the left lower back   LAB RESULTS:  CBC Latest Ref Rng 12/22/2014 12/15/2014  WBC 3.6 - 11.0 K/uL 4.4 3.4(L)  Hemoglobin 12.0 - 16.0 g/dL 12.2 11.7(L)  Hematocrit 35.0 - 47.0 % 37.3 35.9  Platelets 150 - 440 K/uL 215 124(L)    Appointment on 12/22/2014  Component Date Value Ref Range Status  . WBC 12/22/2014 4.4  3.6 - 11.0 K/uL Final  . RBC 12/22/2014 4.32  3.80 - 5.20 MIL/uL Final  . Hemoglobin 12/22/2014 12.2  12.0 - 16.0 g/dL Final  . HCT 12/22/2014 37.3  35.0 - 47.0 % Final  . MCV 12/22/2014 86.3  80.0 - 100.0 fL Final  . MCH 12/22/2014 28.1  26.0 - 34.0 pg Final  . MCHC 12/22/2014 32.6  32.0 - 36.0 g/dL Final  . RDW 12/22/2014 17.5* 11.5 - 14.5 % Final  . Platelets 12/22/2014 215  150 - 440 K/uL Final  . Neutrophils Relative % 12/22/2014 74   Final  . Neutro Abs 12/22/2014 3.2  1.4 - 6.5 K/uL Final  . Lymphocytes Relative 12/22/2014 12   Final  . Lymphs Abs 12/22/2014 0.5* 1.0 - 3.6 K/uL Final  . Monocytes Relative 12/22/2014 12   Final  . Monocytes Absolute 12/22/2014 0.5  0.2 - 0.9 K/uL Final  . Eosinophils Relative 12/22/2014 1   Final  . Eosinophils Absolute 12/22/2014 0.1  0 - 0.7 K/uL Final  . Basophils Relative 12/22/2014 1   Final  . Basophils Absolute 12/22/2014 0.0  0 - 0.1 K/uL Final  . Sodium 12/22/2014 135  135 - 145 mmol/L Final  . Potassium 12/22/2014 3.3* 3.5 - 5.1 mmol/L Final  . Chloride 12/22/2014 96* 101 - 111 mmol/L Final  .  CO2 12/22/2014 29  22 - 32 mmol/L Final  . Glucose, Bld 12/22/2014 160* 65 - 99 mg/dL Final  . BUN 12/22/2014 11  6 - 20 mg/dL Final  . Creatinine, Ser 12/22/2014  0.49  0.44 - 1.00 mg/dL Final  . Calcium 12/22/2014 8.9  8.9 - 10.3 mg/dL Final  . Total Protein 12/22/2014 7.1  6.5 - 8.1 g/dL Final  . Albumin 12/22/2014 3.8  3.5 - 5.0 g/dL Final  . AST 12/22/2014 17  15 - 41 U/L Final  . ALT 12/22/2014 15  14 - 54 U/L Final  . Alkaline Phosphatase 12/22/2014 65  38 - 126 U/L Final  . Total Bilirubin 12/22/2014 0.7  0.3 - 1.2 mg/dL Final  . GFR calc non Af Amer 12/22/2014 >60  >60 mL/min Final  . GFR calc Af Amer 12/22/2014 >60  >60 mL/min Final   Comment: (NOTE) The eGFR has been calculated using the CKD EPI equation. This calculation has not been validated in all clinical situations. eGFR's persistently <60 mL/min signify possible Chronic Kidney Disease.   . Anion gap 12/22/2014 10  5 - 15 Final  . Magnesium 12/22/2014 1.2* 1.7 - 2.4 mg/dL Final       ASSESSMENT: Squamous cell carcinoma of lung right lower lobe positive mediastinal lymph node.  4 x 5 cm left lower lobe mass . supraclavicular lymph node Stage IV disease Postherpetic neuralgia  MEDICAL DECISION MAKING:  Continue day 15 chemotherapy New onset of herpes zoster started on Valtrex 1 g by mouth twice a day for 7 days Continue Valtrex for next 7 days Neurontin has been added for postherpetic neuralgia Vicodin has been added Management of constipation Comment and ischemia Magnesium level is 1.2 Intravenous magnesium was added   Duration of visit is 25 minutes and 50% of time was spent discussing   Pain management for herpetic neuralgia  . Forest Gleason, MD   12/28/2014 8:07 AM                                     Yates Decamp (MR# [id number])

## 2015-01-04 ENCOUNTER — Encounter: Payer: Self-pay | Admitting: Family Medicine

## 2015-01-04 ENCOUNTER — Ambulatory Visit (INDEPENDENT_AMBULATORY_CARE_PROVIDER_SITE_OTHER): Payer: PPO | Admitting: Family Medicine

## 2015-01-04 VITALS — BP 126/82 | HR 106 | Temp 97.3°F | Ht 66.0 in | Wt 278.0 lb

## 2015-01-04 DIAGNOSIS — E1165 Type 2 diabetes mellitus with hyperglycemia: Secondary | ICD-10-CM

## 2015-01-04 DIAGNOSIS — C3491 Malignant neoplasm of unspecified part of right bronchus or lung: Secondary | ICD-10-CM | POA: Diagnosis not present

## 2015-01-04 DIAGNOSIS — E785 Hyperlipidemia, unspecified: Secondary | ICD-10-CM

## 2015-01-04 DIAGNOSIS — Z5181 Encounter for therapeutic drug level monitoring: Secondary | ICD-10-CM

## 2015-01-04 LAB — MICROALBUMIN, URINE WAIVED
CREATININE, URINE WAIVED: 50 mg/dL (ref 10–300)
MICROALB, UR WAIVED: 30 mg/L — AB (ref 0–19)

## 2015-01-04 MED ORDER — ALBUTEROL SULFATE HFA 108 (90 BASE) MCG/ACT IN AERS: 2.0000 | INHALATION_SPRAY | RESPIRATORY_TRACT | Status: DC | PRN

## 2015-01-04 NOTE — Assessment & Plan Note (Signed)
Check A1c and urine microalbumin today; foot exam done by MD; patient to check feet every night; due for eye exam

## 2015-01-04 NOTE — Assessment & Plan Note (Signed)
Under the care of oncologist; doing well, undergoing chemo

## 2015-01-04 NOTE — Patient Instructions (Signed)
Keep up the good work with your diabetes Try to limit saturated fats in your diet (bologna, hot dogs, barbeque, cheeseburgers, hamburgers, steak, bacon, sausage, cheese, etc.) and get more fresh fruits, vegetables, and whole grains Check your feet every night Do please call your eye doctor to schedule an appointment Call your cancer doctor to get those refills Return in 3 months, 30 minute visit

## 2015-01-04 NOTE — Assessment & Plan Note (Signed)
Check lipids today; continue statin; limit saturated fats

## 2015-01-04 NOTE — Progress Notes (Signed)
BP 126/82 mmHg  Pulse 106  Temp(Src) 97.3 F (36.3 C)  Ht '5\' 6"'$  (1.676 m)  Wt 278 lb (126.1 kg)  BMI 44.89 kg/m2  SpO2 91%   Subjective:    Patient ID: Sherry Mueller, female    DOB: 1940/02/18, 75 y.o.   MRN: [id number]  HPI: Sherry Mueller is a 75 y.o. female  Chief Complaint  Patient presents with  . Diabetes    "I'm just here for a check up I guess"  . Hypertension  . Hyperlipidemia  . COPD    She'd like a refill on Albuterol inhaler.   High blood pressure; not checking away; well-controlled today; no HA, no vision problems; no swelling in the legs; taking meds, no problems  DM; sugars 100 to 200 except one incidence dropped to 55; stopp the other medicine  Mood, doing well on the buspirone and SSRI  High cholesterol; taking statin, muscles aches but not from the cholesterol medicine; limiting saturated fats; not using grease, gets rid of extra grease  COPD; using inhaler  Lung cancer; getting chemotherapy; finished 30 radiation treatments; breathing better, but needs refill of SABA; not wheezing, just coughing up some stuff  Relevant past medical, surgical, family and social history reviewed and updated as indicated. Interim medical history since our last visit reviewed. Allergies and medications reviewed and updated.  Review of Systems  Eyes: Negative for visual disturbance.  Neurological: Negative for headaches.  Psychiatric/Behavioral: Negative for dysphoric mood.   Per HPI unless specifically indicated above     Objective:    BP 126/82 mmHg  Pulse 106  Temp(Src) 97.3 F (36.3 C)  Ht '5\' 6"'$  (1.676 m)  Wt 278 lb (126.1 kg)  BMI 44.89 kg/m2  SpO2 91%  Wt Readings from Last 3 Encounters:  01/05/15 278 lb 14.1 oz (126.5 kg)  01/04/15 278 lb (126.1 kg)  12/22/14 278 lb 10.6 oz (126.4 kg)    Physical Exam  Constitutional: She appears well-developed and well-nourished.  Morbidly obese, weight stable  Eyes: EOM are normal. No scleral icterus.  Neck: No  JVD present.  Cardiovascular: Normal rate and regular rhythm.   Pulmonary/Chest: Effort normal and breath sounds normal.  Abdominal: She exhibits no distension.  Musculoskeletal: She exhibits no edema.  Neurological: She is alert.  Skin: Skin is warm. No pallor.  Psychiatric: She has a normal mood and affect.   Diabetic Foot Form - Detailed   Diabetic Foot Exam - detailed  Diabetic Foot exam was performed with the following findings:  Yes   Visual Foot Exam completed.:  Yes  Are the shoes appropriate in style and fit?:  Yes  Are the toenails long?:  No  Are the toenails thick?:  No  Is there a claw toe deformity?:  No  Are the toenails ingrown?:  No  Normal Range of Motion:  Yes    Pulse Foot Exam completed.:  Yes  Right Dorsalis Pedis:  Present Left Dorsalis Pedis:  Present  Sensory Foot Exam Completed.:  Yes  Swelling:  No  Semmes-Weinstein Monofilament Test  R Site 1-Great Toe:  Pos L Site 1-Great Toe:  Pos  R Site 4:  Pos L Site 4:  Pos  R Site 5:  Pos L Site 5:  Pos          Assessment & Plan:   Problem List Items Addressed This Visit      Respiratory   Squamous cell lung cancer ([place])    Under the  care of oncologist; doing well, undergoing chemo        Endocrine   Diabetes mellitus type 2, uncontrolled ([place]) - Primary    Check A1c and urine microalbumin today; foot exam done by MD; patient to check feet every night; due for eye exam      Relevant Orders   Hgb A1c w/o eAG (Completed)   Microalbumin, Urine Waived (Completed)     Other   Hyperlipidemia    Check lipids today; continue statin; limit saturated fats      Relevant Orders   Lipid Panel w/o Chol/HDL Ratio (Completed)   Morbid obesity ([place])    Patient undergoing chemo for lung cancer; oncologist does not want her dieting apparently, so I did not push this today      Medication monitoring encounter      Follow up plan: Return in about 3 months (around 04/04/2015) for thirty minute follow-up  with fasting labs.  Cannot have flu shots she says  An after-visit summary was printed and given to the patient at [facility].  Please see the patient instructions which may contain other information and recommendations beyond what is mentioned above in the assessment and plan.  Orders Placed This Encounter  Procedures  . Hgb A1c w/o eAG  . Lipid Panel w/o Chol/HDL Ratio  . Microalbumin, Urine Waived

## 2015-01-05 ENCOUNTER — Inpatient Hospital Stay: Payer: PPO

## 2015-01-05 ENCOUNTER — Encounter: Payer: Self-pay | Admitting: Oncology

## 2015-01-05 ENCOUNTER — Telehealth: Payer: Self-pay | Admitting: Family Medicine

## 2015-01-05 ENCOUNTER — Inpatient Hospital Stay: Payer: PPO | Attending: Oncology

## 2015-01-05 ENCOUNTER — Inpatient Hospital Stay (HOSPITAL_BASED_OUTPATIENT_CLINIC_OR_DEPARTMENT_OTHER): Payer: PPO | Admitting: Oncology

## 2015-01-05 VITALS — BP 134/58 | HR 125 | Temp 98.3°F | Resp 20 | Wt 278.9 lb

## 2015-01-05 DIAGNOSIS — Z801 Family history of malignant neoplasm of trachea, bronchus and lung: Secondary | ICD-10-CM | POA: Diagnosis not present

## 2015-01-05 DIAGNOSIS — Z800 Family history of malignant neoplasm of digestive organs: Secondary | ICD-10-CM

## 2015-01-05 DIAGNOSIS — I10 Essential (primary) hypertension: Secondary | ICD-10-CM | POA: Diagnosis not present

## 2015-01-05 DIAGNOSIS — C7801 Secondary malignant neoplasm of right lung: Secondary | ICD-10-CM | POA: Insufficient documentation

## 2015-01-05 DIAGNOSIS — IMO0002 Reserved for concepts with insufficient information to code with codable children: Secondary | ICD-10-CM

## 2015-01-05 DIAGNOSIS — E669 Obesity, unspecified: Secondary | ICD-10-CM | POA: Insufficient documentation

## 2015-01-05 DIAGNOSIS — J449 Chronic obstructive pulmonary disease, unspecified: Secondary | ICD-10-CM | POA: Insufficient documentation

## 2015-01-05 DIAGNOSIS — Z8673 Personal history of transient ischemic attack (TIA), and cerebral infarction without residual deficits: Secondary | ICD-10-CM

## 2015-01-05 DIAGNOSIS — F419 Anxiety disorder, unspecified: Secondary | ICD-10-CM | POA: Diagnosis not present

## 2015-01-05 DIAGNOSIS — E119 Type 2 diabetes mellitus without complications: Secondary | ICD-10-CM

## 2015-01-05 DIAGNOSIS — Z7984 Long term (current) use of oral hypoglycemic drugs: Secondary | ICD-10-CM

## 2015-01-05 DIAGNOSIS — C3491 Malignant neoplasm of unspecified part of right bronchus or lung: Secondary | ICD-10-CM

## 2015-01-05 DIAGNOSIS — F329 Major depressive disorder, single episode, unspecified: Secondary | ICD-10-CM | POA: Insufficient documentation

## 2015-01-05 DIAGNOSIS — Z794 Long term (current) use of insulin: Secondary | ICD-10-CM

## 2015-01-05 DIAGNOSIS — Z923 Personal history of irradiation: Secondary | ICD-10-CM | POA: Diagnosis not present

## 2015-01-05 DIAGNOSIS — Z5111 Encounter for antineoplastic chemotherapy: Secondary | ICD-10-CM | POA: Insufficient documentation

## 2015-01-05 DIAGNOSIS — C3432 Malignant neoplasm of lower lobe, left bronchus or lung: Secondary | ICD-10-CM | POA: Insufficient documentation

## 2015-01-05 DIAGNOSIS — R0602 Shortness of breath: Secondary | ICD-10-CM | POA: Insufficient documentation

## 2015-01-05 DIAGNOSIS — E785 Hyperlipidemia, unspecified: Secondary | ICD-10-CM

## 2015-01-05 DIAGNOSIS — E1165 Type 2 diabetes mellitus with hyperglycemia: Secondary | ICD-10-CM

## 2015-01-05 DIAGNOSIS — Z79899 Other long term (current) drug therapy: Secondary | ICD-10-CM | POA: Insufficient documentation

## 2015-01-05 DIAGNOSIS — B029 Zoster without complications: Secondary | ICD-10-CM | POA: Insufficient documentation

## 2015-01-05 DIAGNOSIS — Z87891 Personal history of nicotine dependence: Secondary | ICD-10-CM | POA: Diagnosis not present

## 2015-01-05 DIAGNOSIS — C3490 Malignant neoplasm of unspecified part of unspecified bronchus or lung: Secondary | ICD-10-CM

## 2015-01-05 LAB — COMPREHENSIVE METABOLIC PANEL
ALK PHOS: 64 U/L (ref 38–126)
ALT: 12 U/L — AB (ref 14–54)
AST: 16 U/L (ref 15–41)
Albumin: 3.9 g/dL (ref 3.5–5.0)
Anion gap: 10 (ref 5–15)
BILIRUBIN TOTAL: 1.2 mg/dL (ref 0.3–1.2)
BUN: 11 mg/dL (ref 6–20)
CALCIUM: 9.3 mg/dL (ref 8.9–10.3)
CHLORIDE: 97 mmol/L — AB (ref 101–111)
CO2: 27 mmol/L (ref 22–32)
CREATININE: 0.54 mg/dL (ref 0.44–1.00)
Glucose, Bld: 200 mg/dL — ABNORMAL HIGH (ref 65–99)
Potassium: 3.8 mmol/L (ref 3.5–5.1)
Sodium: 134 mmol/L — ABNORMAL LOW (ref 135–145)
TOTAL PROTEIN: 7.3 g/dL (ref 6.5–8.1)

## 2015-01-05 LAB — LIPID PANEL W/O CHOL/HDL RATIO
CHOLESTEROL TOTAL: 126 mg/dL (ref 100–199)
HDL: 46 mg/dL (ref >39)
LDL Calculated: 49 mg/dL (ref 0–99)
TRIGLYCERIDES: 155 mg/dL — AB (ref 0–149)
VLDL Cholesterol Cal: 31 mg/dL (ref 5–40)

## 2015-01-05 LAB — CBC WITH DIFFERENTIAL/PLATELET
Basophils Absolute: 0 10*3/uL (ref 0–0.1)
Basophils Relative: 0 %
EOS PCT: 1 %
Eosinophils Absolute: 0 10*3/uL (ref 0–0.7)
HEMATOCRIT: 37.4 % (ref 35.0–47.0)
Hemoglobin: 12.4 g/dL (ref 12.0–16.0)
LYMPHS ABS: 0.5 10*3/uL — AB (ref 1.0–3.6)
LYMPHS PCT: 10 %
MCH: 28.8 pg (ref 26.0–34.0)
MCHC: 33.1 g/dL (ref 32.0–36.0)
MCV: 86.8 fL (ref 80.0–100.0)
Monocytes Absolute: 0.5 10*3/uL (ref 0.2–0.9)
Monocytes Relative: 10 %
Neutro Abs: 4.5 10*3/uL (ref 1.4–6.5)
Neutrophils Relative %: 79 %
PLATELETS: 255 10*3/uL (ref 150–440)
RBC: 4.31 MIL/uL (ref 3.80–5.20)
RDW: 20.1 % — ABNORMAL HIGH (ref 11.5–14.5)
WBC: 5.7 10*3/uL (ref 3.6–11.0)

## 2015-01-05 LAB — HGB A1C W/O EAG: Hgb A1c MFr Bld: 8.4 % — ABNORMAL HIGH (ref 4.8–5.6)

## 2015-01-05 LAB — MAGNESIUM: MAGNESIUM: 1.2 mg/dL — AB (ref 1.7–2.4)

## 2015-01-05 MED ORDER — PRAVASTATIN SODIUM 40 MG PO TABS: 40.0000 mg | ORAL_TABLET | Freq: Every day | ORAL | Status: DC

## 2015-01-05 MED ORDER — LINAGLIPTIN 5 MG PO TABS: 5.0000 mg | ORAL_TABLET | Freq: Every day | ORAL | Status: DC

## 2015-01-05 MED ORDER — GABAPENTIN 300 MG PO CAPS: 300.0000 mg | ORAL_CAPSULE | Freq: Two times a day (BID) | ORAL | Status: DC

## 2015-01-05 MED ORDER — SODIUM CHLORIDE 0.9 % IV SOLN
630.0000 mg | Freq: Once | INTRAVENOUS | Status: AC
  Administered 2015-01-05: 630 mg via INTRAVENOUS
  Filled 2015-01-05: qty 63

## 2015-01-05 MED ORDER — MAGNESIUM SULFATE 4 GM/100ML IV SOLN
4.0000 g | Freq: Once | INTRAVENOUS | Status: AC
  Administered 2015-01-05: 4 g via INTRAVENOUS
  Filled 2015-01-05: qty 100

## 2015-01-05 MED ORDER — PACLITAXEL PROTEIN-BOUND CHEMO INJECTION 100 MG
90.0000 mg/m2 | Freq: Once | INTRAVENOUS | Status: AC
  Administered 2015-01-05: 225 mg via INTRAVENOUS
  Filled 2015-01-05: qty 45

## 2015-01-05 MED ORDER — HYDROCODONE-ACETAMINOPHEN 5-325 MG PO TABS: 1.0000 | ORAL_TABLET | Freq: Four times a day (QID) | ORAL | Status: DC | PRN

## 2015-01-05 MED ORDER — PALONOSETRON HCL INJECTION 0.25 MG/5ML
0.2500 mg | Freq: Once | INTRAVENOUS | Status: AC
  Administered 2015-01-05: 0.25 mg via INTRAVENOUS
  Filled 2015-01-05: qty 5

## 2015-01-05 MED ORDER — SODIUM CHLORIDE 0.9 % IV SOLN
Freq: Once | INTRAVENOUS | Status: AC
  Administered 2015-01-05: 11:00:00 via INTRAVENOUS
  Filled 2015-01-05: qty 1000

## 2015-01-05 NOTE — Progress Notes (Signed)
Sherry Mueller (MR# [id number])     [place] [facility] [phone]  Fax:(336) [place] OB: [birth year]  MR#: [id number]  FMM#:[id number]  Patient Care Team: Arnetha Courser, MD as PCP - General (Family Medicine) Arnetha Courser, MD (Family Medicine)  CHIEF COMPLAINT:  Chief Complaint  Patient presents with  . Lung Cancer    1.  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 Status post radiation therapy but patient did not have any significant amount of chemotherapy only one dose of carboplatinum has patient declined chemotherapy along with radiation treatment because of side effect of radiation therapy Diagnosis in July of 2016 2.  Progressive disease in the left lower lobe on recent CT scan of November 2 016 Patient is starting carboplatinum and Abraxane (November, 2016)   Oncology Flowsheet 07/08/2014 09/10/2014 11/17/2014 12/01/2014 12/08/2014 12/22/2014 01/05/2015  Day, Cycle - Day 1, Cycle 1 Day 1, Cycle 1 1, Cycle 1 Day 1, Cycle 2 Day 8, Cycle 2 Day 1, Cycle 3  CARBOplatin (PARAPLATIN) IV - 250 mg 630 mg - 630 mg - -  dexamethasone (DECADRON) IJ - - - - - - -  ondansetron (ZOFRAN) IV - [ 16 mg ] - [ 16 mg ] - [ 16 mg ] -  PACLitaxel-protein bound (ABRAXANE) IV - - 90 mg/m2 90 mg/m2 90 mg/m2 90 mg/m2 90 mg/m2  palonosetron (ALOXI) IV - - 0.25 mg - 0.25 mg - 0.25 mg    INTERVAL HISTORY:  75 year old lady with moderate obesity and insulin-dependent diabetes came today further follow-up has shortness of breath on exertion patient is using oxygen at home.   Patient is here for ongoing evaluation and treatment consideration.  This is the beginning of cycle 3   Patient still having pain with shingles. 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 According to patient seek and take no deep  breath and breathing has improved.  Shortness of breath is better.  No tingling.  No numbness.  No chills.  No fever.        REVIEW OF SYSTEMS:   Gen. status: Patient is in mild distress because of herpetic neuralgia Feeling weak and tired.  No chills.  No fever. GI: No nausea no vomiting no diarrhea. GU: No dysuria or hematuria Skin: Herpetic rash in the left gluteal area has been better.  But postherpetic neurology a continues to Lungs: Shortness of breath : No chest pain. Lower extremity no swelling neurological system no headache no dizziness As an continues to have postherpetic neuralgia in the low back area Neurological system: No headache.  No dizziness.  No other focal symptoms    As per HPI. Otherwise, a complete review of systems is negatve.  PAST MEDICAL HISTORY: Past Medical History  Diagnosis Date  . Stroke ([place])   . Hyperlipidemia   . Cancer ([place])   . Hypertension   . COPD (chronic obstructive pulmonary disease) ([place])   . Shortness of breath dyspnea   . Diabetes mellitus without complication ([place])   . Depression   . Anxiety   . Headache     PAST SURGICAL HISTORY: Past Surgical History  Procedure Laterality Date  . Breast surgery Left cyst removed  . Cardiac catheterization    . Colonoscopy    . Endobronchial ultrasound Right 07/08/2014  Procedure: ENDOBRONCHIAL ULTRASOUND;  Surgeon: Vilinda Boehringer, MD;  Location: [facility] ORS;  Service: Cardiopulmonary;  Laterality: Right;    FAMILY HISTORY Family History  Problem Relation Age of Onset  . Heart disease Mother   . Hypertension Mother   . Congestive Heart Failure Mother   . Heart disease Father   . Stroke Father   . Cancer Father     colon  . Cancer Maternal Uncle     lung  . Lung disease Maternal Grandmother   . Alcohol abuse Maternal Grandfather   . Cancer Paternal Grandmother   . Stroke Paternal Grandmother     ADVANCED DIRECTIVES:  No flowsheet data found.  HEALTH MAINTENANCE: Social  History  Substance Use Topics  . Smoking status: Former Smoker -- 2.00 packs/day    Types: Cigarettes    Quit date: 06/14/2004  . Smokeless tobacco: Never Used  . Alcohol Use: No      Allergies  Allergen Reactions  . Byetta 10 Mcg Pen [Exenatide] Nausea Only  . Hctz [Hydrochlorothiazide] Nausea Only  . Penicillins Itching  . Plavix [Clopidogrel] Nausea Only  . Plavix [Clopidogrel] Nausea And Vomiting    Current Outpatient Prescriptions  Medication Sig Dispense Refill  . albuterol (PROVENTIL HFA;VENTOLIN HFA) 108 (90 Base) MCG/ACT inhaler Inhale 2 puffs into the lungs every 4 (four) hours as needed for wheezing or shortness of breath. 1 Inhaler 2  . amLODipine (NORVASC) 5 MG tablet TAKE 1 TABLET BY MOUTH EVERY DAY 30 tablet 3  . B Complex Vitamins (B COMPLEX PO) Take 1 tablet by mouth every morning.    . benazepril (LOTENSIN) 20 MG tablet Take 1 tablet (20 mg total) by mouth daily. 90 tablet 3  . busPIRone (BUSPAR) 10 MG tablet Take 10 mg by mouth 2 (two) times daily as needed.    . Cholecalciferol (VITAMIN D3) 2000 UNITS TABS Take 1 tablet by mouth every morning.    . citalopram (CELEXA) 40 MG tablet TAKE 1 TABLET BY MOUTH AT BEDTIME 30 tablet 2  . co-enzyme Q-10 30 MG capsule Take 30 mg by mouth daily.    . fluticasone (FLONASE) 50 MCG/ACT nasal spray Place 2 sprays into both nostrils daily. 15.8 g 11  . Fluticasone-Salmeterol (ADVAIR DISKUS) 250-50 MCG/DOSE AEPB Inhale 1 puff into the lungs as needed.    . gabapentin (NEURONTIN) 300 MG capsule Take 1 capsule (300 mg total) by mouth 2 (two) times daily. 60 capsule 0  . glimepiride (AMARYL) 4 MG tablet TAKE 1 TABLET BY MOUTH EVERY DAY 30 tablet 5  . glucose blood test strip OneTouch Ultra test strips; check glucose 3x a day; LON 99 months; E11.65 100 each 11  . glucose monitoring kit (FREESTYLE) monitoring kit 1 each by Does not apply route as needed for other.    . guaiFENesin-codeine 100-10 MG/5ML syrup Take 5 mLs by mouth 3  (three) times daily as needed for cough. 120 mL 2  . HYDROcodone-acetaminophen (NORCO/VICODIN) 5-325 MG tablet Take 1 tablet by mouth every 6 (six) hours as needed for moderate pain. 30 tablet 0  . Insulin Pen Needle 31G X 5 MM MISC Use with insulin pen to inject insulin under the skin once a day 100 each 1  . isosorbide mononitrate (IMDUR) 30 MG 24 hr tablet TAKE 1 TABLET BY MOUTH EVERY DAY 30 tablet 5  . lansoprazole (PREVACID) 30 MG capsule Take 1 capsule (30 mg total) by mouth daily at 12 noon. 30 capsule 2  . magnesium chloride (SLOW-MAG)  64 MG TBEC SR tablet Take 1 tablet (64 mg total) by mouth 2 (two) times daily. 30 tablet 0  . metFORMIN (GLUCOPHAGE-XR) 500 MG 24 hr tablet Take 2 tablets (1,000 mg total) by mouth 2 (two) times daily. Take 2 tablets twice a day 360 tablet 1  . Multiple Vitamins-Minerals (MULTIVITAMIN PO) Take 1 tablet by mouth 2 (two) times daily.    . Omega-3 Fatty Acids (FISH OIL PO) Take 1,000 mg by mouth every morning.    . ondansetron (ZOFRAN) 4 MG tablet Take 1 tablet (4 mg total) by mouth every 6 (six) hours as needed for nausea or vomiting. 30 tablet 3  . pravastatin (PRAVACHOL) 80 MG tablet TAKE 1 TABLET BY MOUTH AT BEDTIME 30 tablet 2   No current facility-administered medications for this visit.   Facility-Administered Medications Ordered in Other Visits  Medication Dose Route Frequency Provider Last Rate Last Dose  . 0.9 %  sodium chloride infusion   Intravenous Once Forest Gleason, MD      . CARBOplatin (PARAPLATIN) 630 mg in sodium chloride 0.9 % 250 mL chemo infusion  630 mg Intravenous Once Forest Gleason, MD      . PACLitaxel-protein bound (ABRAXANE) chemo infusion 225 mg  90 mg/m2 (Treatment Plan Actual) Intravenous Once Forest Gleason, MD   Stopped at 01/05/15 1358    OBJECTIVE:  Filed Vitals:   01/05/15 1024  BP: 134/58  Pulse: 125  Temp: 98.3 F (36.8 C)  Resp: 20     Body mass index is 45.03 kg/(m^2).    ECOG FS:1 - Symptomatic but completely  ambulatory  PHYSICAL EXAM: Gen. status: Moderately of obese lady in mild distress because of exertion.  Oxygen never dropped to 87% on exertion Lungs: Diminished air entry on both sides.  No rhonchi or rales Cardiac: Tachycardia Lymphatic system: Supraclavicular, cervical, axillary, inguinal lymph nodes are not palpable Abdominal exam revealed normal bowel sounds. The abdomen was soft, non-tender, and without masses, organomegaly, or appreciable enlargement of the abdominal aorta. Examination of the skin revealed no evidence of significant rashes, suspicious appearing nevi or other concerning lesions. Neurologically, the patient was awake, alert, and oriented to person, place and time. There were no obvious focal neurologic abnormalities. Skin: Herpes zoster on the left lower back   LAB RESULTS:  CBC Latest Ref Rng 01/05/2015 12/22/2014  WBC 3.6 - 11.0 K/uL 5.7 4.4  Hemoglobin 12.0 - 16.0 g/dL 12.4 12.2  Hematocrit 35.0 - 47.0 % 37.4 37.3  Platelets 150 - 440 K/uL 255 215    Appointment on 01/05/2015  Component Date Value Ref Range Status  . WBC 01/05/2015 5.7  3.6 - 11.0 K/uL Final  . RBC 01/05/2015 4.31  3.80 - 5.20 MIL/uL Final  . Hemoglobin 01/05/2015 12.4  12.0 - 16.0 g/dL Final  . HCT 01/05/2015 37.4  35.0 - 47.0 % Final  . MCV 01/05/2015 86.8  80.0 - 100.0 fL Final  . MCH 01/05/2015 28.8  26.0 - 34.0 pg Final  . MCHC 01/05/2015 33.1  32.0 - 36.0 g/dL Final  . RDW 01/05/2015 20.1* 11.5 - 14.5 % Final  . Platelets 01/05/2015 255  150 - 440 K/uL Final  . Neutrophils Relative % 01/05/2015 79   Final  . Neutro Abs 01/05/2015 4.5  1.4 - 6.5 K/uL Final  . Lymphocytes Relative 01/05/2015 10   Final  . Lymphs Abs 01/05/2015 0.5* 1.0 - 3.6 K/uL Final  . Monocytes Relative 01/05/2015 10   Final  . Monocytes Absolute 01/05/2015  0.5  0.2 - 0.9 K/uL Final  . Eosinophils Relative 01/05/2015 1   Final  . Eosinophils Absolute 01/05/2015 0.0  0 - 0.7 K/uL Final  . Basophils Relative  01/05/2015 0   Final  . Basophils Absolute 01/05/2015 0.0  0 - 0.1 K/uL Final  . Sodium 01/05/2015 134* 135 - 145 mmol/L Final  . Potassium 01/05/2015 3.8  3.5 - 5.1 mmol/L Final  . Chloride 01/05/2015 97* 101 - 111 mmol/L Final  . CO2 01/05/2015 27  22 - 32 mmol/L Final  . Glucose, Bld 01/05/2015 200* 65 - 99 mg/dL Final  . BUN 01/05/2015 11  6 - 20 mg/dL Final  . Creatinine, Ser 01/05/2015 0.54  0.44 - 1.00 mg/dL Final  . Calcium 01/05/2015 9.3  8.9 - 10.3 mg/dL Final  . Total Protein 01/05/2015 7.3  6.5 - 8.1 g/dL Final  . Albumin 01/05/2015 3.9  3.5 - 5.0 g/dL Final  . AST 01/05/2015 16  15 - 41 U/L Final  . ALT 01/05/2015 12* 14 - 54 U/L Final  . Alkaline Phosphatase 01/05/2015 64  38 - 126 U/L Final  . Total Bilirubin 01/05/2015 1.2  0.3 - 1.2 mg/dL Final  . GFR calc non Af Amer 01/05/2015 >60  >60 mL/min Final  . GFR calc Af Amer 01/05/2015 >60  >60 mL/min Final   Comment: (NOTE) The eGFR has been calculated using the CKD EPI equation. This calculation has not been validated in all clinical situations. eGFR's persistently <60 mL/min signify possible Chronic Kidney Disease.   . Anion gap 01/05/2015 10  5 - 15 Final  . Magnesium 01/05/2015 1.2* 1.7 - 2.4 mg/dL Final  Office Visit on 01/04/2015  Component Date Value Ref Range Status  . Hgb A1c MFr Bld 01/04/2015 8.4* 4.8 - 5.6 % Final   Comment:          Pre-diabetes: 5.7 - 6.4          Diabetes: >6.4          Glycemic control for adults with diabetes: <7.0   . Cholesterol, Total 01/04/2015 126  100 - 199 mg/dL Final  . Triglycerides 01/04/2015 155* 0 - 149 mg/dL Final  . HDL 01/04/2015 46  >39 mg/dL Final  . VLDL Cholesterol Cal 01/04/2015 31  5 - 40 mg/dL Final  . LDL Calculated 01/04/2015 49  0 - 99 mg/dL Final  . Microalb, Ur Waived 01/04/2015 30* 0 - 19 mg/L Final  . Creatinine, Urine Waived 01/04/2015 50  10 - 300 mg/dL Final  . Microalb/Creat Ratio 01/04/2015 30-300* <30 mg/g Final   Comment:                               Abnormal:       30 - 300                         High Abnormal:           >300        ASSESSMENT: Squamous cell carcinoma of lung right lower lobe positive mediastinal lymph node.  4 x 5 cm left lower lobe mass . supraclavicular lymph node Stage IV disease Postherpetic neuralgia Gabapentin and Vicodin is controlling pain will continue that.   MEDICAL DECISION MAKING:  All lab data has been reviewed Continue chemotherapy beginning with cycle 3 Continue Vicodin and the gabapentin for postherpetic neuralgia Hypomagnesemia Magnesium level  is 1.2 we will continue magnesium 4 g intravenously    . Forest Gleason, MD   01/05/2015 1:37 PM                                     Sherry Mueller (MR# [id number])

## 2015-01-05 NOTE — Assessment & Plan Note (Signed)
Patient undergoing chemo for lung cancer; oncologist does not want her dieting apparently, so I did not push this today

## 2015-01-05 NOTE — Telephone Encounter (Signed)
Please let patient know that her cholesterol is so good, I would like to decrease her cholesterol medicine from 80 mg to 40 mg every night Her A1c (the 3 month average blood sugar) has gone up, and we need to lower it by 1.5 points; I'll suggest that we add an additional medicine, Tradjenta, and I sent in Rx for that We'll see her back in 3 months and recheck her A1c

## 2015-01-05 NOTE — Progress Notes (Signed)
Patient still having pain with shingles.  Requesting refill for Vicodin, gabapentin, tramadol.

## 2015-01-06 NOTE — Telephone Encounter (Signed)
Patient notified

## 2015-01-06 NOTE — Telephone Encounter (Signed)
Left message to call.

## 2015-01-09 ENCOUNTER — Other Ambulatory Visit: Payer: Self-pay | Admitting: Family Medicine

## 2015-01-09 NOTE — Telephone Encounter (Signed)
rx approved

## 2015-01-10 ENCOUNTER — Other Ambulatory Visit: Payer: Self-pay | Admitting: Oncology

## 2015-01-12 ENCOUNTER — Ambulatory Visit: Payer: PPO

## 2015-01-12 ENCOUNTER — Other Ambulatory Visit: Payer: PPO

## 2015-01-12 ENCOUNTER — Ambulatory Visit: Payer: PPO | Admitting: Oncology

## 2015-01-14 ENCOUNTER — Ambulatory Visit: Payer: Self-pay | Admitting: Hematology and Oncology

## 2015-01-19 ENCOUNTER — Inpatient Hospital Stay: Payer: PPO

## 2015-01-19 ENCOUNTER — Inpatient Hospital Stay (HOSPITAL_BASED_OUTPATIENT_CLINIC_OR_DEPARTMENT_OTHER): Payer: PPO | Admitting: Oncology

## 2015-01-19 ENCOUNTER — Encounter: Payer: Self-pay | Admitting: Oncology

## 2015-01-19 VITALS — BP 137/83 | HR 111 | Temp 98.1°F | Resp 18 | Wt 276.0 lb

## 2015-01-19 DIAGNOSIS — Z5111 Encounter for antineoplastic chemotherapy: Secondary | ICD-10-CM | POA: Diagnosis not present

## 2015-01-19 DIAGNOSIS — C3491 Malignant neoplasm of unspecified part of right bronchus or lung: Secondary | ICD-10-CM

## 2015-01-19 DIAGNOSIS — C3432 Malignant neoplasm of lower lobe, left bronchus or lung: Secondary | ICD-10-CM

## 2015-01-19 DIAGNOSIS — E785 Hyperlipidemia, unspecified: Secondary | ICD-10-CM

## 2015-01-19 DIAGNOSIS — IMO0002 Reserved for concepts with insufficient information to code with codable children: Secondary | ICD-10-CM

## 2015-01-19 DIAGNOSIS — J449 Chronic obstructive pulmonary disease, unspecified: Secondary | ICD-10-CM

## 2015-01-19 DIAGNOSIS — B029 Zoster without complications: Secondary | ICD-10-CM

## 2015-01-19 DIAGNOSIS — E1165 Type 2 diabetes mellitus with hyperglycemia: Secondary | ICD-10-CM

## 2015-01-19 DIAGNOSIS — F419 Anxiety disorder, unspecified: Secondary | ICD-10-CM

## 2015-01-19 DIAGNOSIS — Z801 Family history of malignant neoplasm of trachea, bronchus and lung: Secondary | ICD-10-CM

## 2015-01-19 DIAGNOSIS — E119 Type 2 diabetes mellitus without complications: Secondary | ICD-10-CM

## 2015-01-19 DIAGNOSIS — C3490 Malignant neoplasm of unspecified part of unspecified bronchus or lung: Secondary | ICD-10-CM

## 2015-01-19 DIAGNOSIS — Z794 Long term (current) use of insulin: Secondary | ICD-10-CM | POA: Diagnosis not present

## 2015-01-19 DIAGNOSIS — Z800 Family history of malignant neoplasm of digestive organs: Secondary | ICD-10-CM

## 2015-01-19 DIAGNOSIS — Z79899 Other long term (current) drug therapy: Secondary | ICD-10-CM

## 2015-01-19 DIAGNOSIS — I10 Essential (primary) hypertension: Secondary | ICD-10-CM

## 2015-01-19 DIAGNOSIS — Z7984 Long term (current) use of oral hypoglycemic drugs: Secondary | ICD-10-CM

## 2015-01-19 DIAGNOSIS — R0602 Shortness of breath: Secondary | ICD-10-CM

## 2015-01-19 DIAGNOSIS — F329 Major depressive disorder, single episode, unspecified: Secondary | ICD-10-CM

## 2015-01-19 DIAGNOSIS — Z8673 Personal history of transient ischemic attack (TIA), and cerebral infarction without residual deficits: Secondary | ICD-10-CM

## 2015-01-19 DIAGNOSIS — Z87891 Personal history of nicotine dependence: Secondary | ICD-10-CM

## 2015-01-19 DIAGNOSIS — Z923 Personal history of irradiation: Secondary | ICD-10-CM

## 2015-01-19 DIAGNOSIS — C7801 Secondary malignant neoplasm of right lung: Secondary | ICD-10-CM | POA: Diagnosis not present

## 2015-01-19 DIAGNOSIS — E669 Obesity, unspecified: Secondary | ICD-10-CM

## 2015-01-19 LAB — CBC WITH DIFFERENTIAL/PLATELET
BASOS ABS: 0.1 10*3/uL (ref 0–0.1)
Basophils Relative: 1 %
EOS ABS: 0.1 10*3/uL (ref 0–0.7)
EOS PCT: 2 %
HCT: 38 % (ref 35.0–47.0)
Hemoglobin: 12.4 g/dL (ref 12.0–16.0)
LYMPHS ABS: 0.4 10*3/uL — AB (ref 1.0–3.6)
LYMPHS PCT: 5 %
MCH: 29 pg (ref 26.0–34.0)
MCHC: 32.6 g/dL (ref 32.0–36.0)
MCV: 88.8 fL (ref 80.0–100.0)
MONO ABS: 0.9 10*3/uL (ref 0.2–0.9)
Monocytes Relative: 14 %
Neutro Abs: 5.5 10*3/uL (ref 1.4–6.5)
Neutrophils Relative %: 78 %
PLATELETS: 234 10*3/uL (ref 150–440)
RBC: 4.28 MIL/uL (ref 3.80–5.20)
RDW: 21.5 % — AB (ref 11.5–14.5)
WBC: 7 10*3/uL (ref 3.6–11.0)

## 2015-01-19 LAB — MAGNESIUM: MAGNESIUM: 1.4 mg/dL — AB (ref 1.7–2.4)

## 2015-01-19 MED ORDER — MAGNESIUM SULFATE 2 GM/50ML IV SOLN
2.0000 g | Freq: Once | INTRAVENOUS | Status: AC
  Administered 2015-01-19: 2 g via INTRAVENOUS
  Filled 2015-01-19: qty 50

## 2015-01-19 MED ORDER — PACLITAXEL PROTEIN-BOUND CHEMO INJECTION 100 MG
90.0000 mg/m2 | Freq: Once | INTRAVENOUS | Status: AC
  Administered 2015-01-19: 225 mg via INTRAVENOUS
  Filled 2015-01-19: qty 45

## 2015-01-19 MED ORDER — SODIUM CHLORIDE 0.9 % IV SOLN
Freq: Once | INTRAVENOUS | Status: AC
  Administered 2015-01-19: 14:00:00 via INTRAVENOUS
  Filled 2015-01-19: qty 1000

## 2015-01-19 MED ORDER — SODIUM CHLORIDE 0.9 % IV SOLN
Freq: Once | INTRAVENOUS | Status: AC
  Administered 2015-01-19: 16:00:00 via INTRAVENOUS
  Filled 2015-01-19: qty 8

## 2015-01-19 NOTE — Progress Notes (Signed)
Shingles are resolving.  Patient states she is much better today.

## 2015-01-19 NOTE — Progress Notes (Signed)
Sherry Mueller (MR# [id number])     [place] @ [facility] [phone]  Fax:(336) [place] OB: 11-14-1940  MR#: [id number]  JQZ#:[id number]  Patient Care Team: Arnetha Courser, MD as PCP - General (Family Medicine) Arnetha Courser, MD (Family Medicine)  CHIEF COMPLAINT:  Chief Complaint  Patient presents with  . Lung Cancer    1.  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 Status post radiation therapy but patient did not have any significant amount of chemotherapy only one dose of carboplatinum has patient declined chemotherapy along with radiation treatment because of side effect of radiation therapy Diagnosis in July of 2016 2.  Progressive disease in the left lower lobe on recent CT scan of November 2 016 Patient is starting carboplatinum and Abraxane (November, 2016)   Oncology Flowsheet 07/08/2014 09/10/2014 11/17/2014 12/01/2014 12/08/2014 12/22/2014 01/05/2015  Day, Cycle - Day 1, Cycle 1 Day 1, Cycle 1 1, Cycle 1 Day 1, Cycle 2 Day 8, Cycle 2 Day 1, Cycle 3  CARBOplatin (PARAPLATIN) IV - 250 mg 630 mg - 630 mg - 630 mg  dexamethasone (DECADRON) IJ - - - - - - -  ondansetron (ZOFRAN) IV - [ 16 mg ] - [ 16 mg ] - [ 16 mg ] -  PACLitaxel-protein bound (ABRAXANE) IV - - 90 mg/m2 90 mg/m2 90 mg/m2 90 mg/m2 90 mg/m2  palonosetron (ALOXI) IV - - 0.25 mg - 0.25 mg - 0.25 mg    INTERVAL HISTORY:  75 year old lady with moderate obesity and insulin-dependent diabetes came today further follow-up has shortness of breath on exertion patient is using oxygen at home.   Patient is here for ongoing evaluation and treatment consideration.  This is the beginning of cycle 3   Patient still having pain with shingles. 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 According to patient seek and take no  deep breath and breathing has improved.  Shortness of breath is better.  No tingling.  No numbness.  No chills.  No fever.        REVIEW OF SYSTEMS:   Gen. status: Patient is in mild distress because of herpetic neuralgia Feeling weak and tired.  No chills.  No fever. GI: No nausea no vomiting no diarrhea. GU: No dysuria or hematuria Skin: Herpetic rash in the left gluteal area has been better.  But postherpetic neurology a continues to Lungs: Shortness of breath : No chest pain. Lower extremity no swelling neurological system no headache no dizziness As an continues to have postherpetic neuralgia in the low back area Neurological system: No headache.  No dizziness.  No other focal symptoms    As per HPI. Otherwise, a complete review of systems is negatve.  PAST MEDICAL HISTORY: Past Medical History  Diagnosis Date  . Stroke ([place])   . Hyperlipidemia   . Cancer ([place])   . Hypertension   . COPD (chronic obstructive pulmonary disease) ([place])   . Shortness of breath dyspnea   . Diabetes mellitus without complication ([place])   . Depression   . Anxiety   . Headache     PAST SURGICAL HISTORY: Past Surgical History  Procedure Laterality Date  . Breast surgery Left cyst removed  . Cardiac catheterization    . Colonoscopy    . Endobronchial ultrasound Right 07/08/2014  Procedure: ENDOBRONCHIAL ULTRASOUND;  Surgeon: Vilinda Boehringer, MD;  Location: [facility] ORS;  Service: Cardiopulmonary;  Laterality: Right;    FAMILY HISTORY Family History  Problem Relation Age of Onset  . Heart disease Mother   . Hypertension Mother   . Congestive Heart Failure Mother   . Heart disease Father   . Stroke Father   . Cancer Father     colon  . Cancer Maternal Uncle     lung  . Lung disease Maternal Grandmother   . Alcohol abuse Maternal Grandfather   . Cancer Paternal Grandmother   . Stroke Paternal Grandmother     ADVANCED DIRECTIVES:  No flowsheet data found.  HEALTH  MAINTENANCE: Social History  Substance Use Topics  . Smoking status: Former Smoker -- 2.00 packs/day    Types: Cigarettes    Quit date: 06/14/2004  . Smokeless tobacco: Never Used  . Alcohol Use: No      Allergies  Allergen Reactions  . Byetta 10 Mcg Pen [Exenatide] Nausea Only  . Hctz [Hydrochlorothiazide] Nausea Only  . Penicillins Itching  . Plavix [Clopidogrel] Nausea Only  . Plavix [Clopidogrel] Nausea And Vomiting    Current Outpatient Prescriptions  Medication Sig Dispense Refill  . albuterol (PROVENTIL HFA;VENTOLIN HFA) 108 (90 Base) MCG/ACT inhaler Inhale 2 puffs into the lungs every 4 (four) hours as needed for wheezing or shortness of breath. 1 Inhaler 2  . amLODipine (NORVASC) 5 MG tablet TAKE 1 TABLET BY MOUTH EVERY DAY 30 tablet 11  . B Complex Vitamins (B COMPLEX PO) Take 1 tablet by mouth every morning.    . benazepril (LOTENSIN) 20 MG tablet Take 1 tablet (20 mg total) by mouth daily. 90 tablet 3  . busPIRone (BUSPAR) 10 MG tablet Take 10 mg by mouth 2 (two) times daily as needed.    . Cholecalciferol (VITAMIN D3) 2000 UNITS TABS Take 1 tablet by mouth every morning.    . citalopram (CELEXA) 40 MG tablet TAKE 1 TABLET BY MOUTH AT BEDTIME 30 tablet 2  . co-enzyme Q-10 30 MG capsule Take 30 mg by mouth daily.    . fluticasone (FLONASE) 50 MCG/ACT nasal spray Place 2 sprays into both nostrils daily. 15.8 g 11  . Fluticasone-Salmeterol (ADVAIR DISKUS) 250-50 MCG/DOSE AEPB Inhale 1 puff into the lungs as needed.    . gabapentin (NEURONTIN) 300 MG capsule Take 1 capsule (300 mg total) by mouth 2 (two) times daily. 60 capsule 0  . glimepiride (AMARYL) 4 MG tablet TAKE 1 TABLET BY MOUTH EVERY DAY 30 tablet 5  . glucose blood test strip OneTouch Ultra test strips; check glucose 3x a day; LON 99 months; E11.65 100 each 11  . glucose monitoring kit (FREESTYLE) monitoring kit 1 each by Does not apply route as needed for other.    . guaiFENesin-codeine 100-10 MG/5ML syrup Take  5 mLs by mouth 3 (three) times daily as needed for cough. 120 mL 2  . HYDROcodone-acetaminophen (NORCO/VICODIN) 5-325 MG tablet Take 1 tablet by mouth every 6 (six) hours as needed for moderate pain. 30 tablet 0  . isosorbide mononitrate (IMDUR) 30 MG 24 hr tablet TAKE 1 TABLET BY MOUTH EVERY DAY 30 tablet 5  . lansoprazole (PREVACID) 30 MG capsule Take 1 capsule (30 mg total) by mouth daily at 12 noon. 30 capsule 2  . linagliptin (TRADJENTA) 5 MG TABS tablet Take 1 tablet (5 mg total) by mouth daily. 30 tablet 2  . MAG64 535 (64 Mg) MG TBCR TAKE 1 TABLET (  64 MG TOTAL) BY MOUTH 2 (TWO) TIMES DAILY. 60 tablet 0  . metFORMIN (GLUCOPHAGE-XR) 500 MG 24 hr tablet Take 2 tablets (1,000 mg total) by mouth 2 (two) times daily. Take 2 tablets twice a day 360 tablet 1  . Multiple Vitamins-Minerals (MULTIVITAMIN PO) Take 1 tablet by mouth 2 (two) times daily.    . Omega-3 Fatty Acids (FISH OIL PO) Take 1,000 mg by mouth every morning.    . ondansetron (ZOFRAN) 4 MG tablet Take 1 tablet (4 mg total) by mouth every 6 (six) hours as needed for nausea or vomiting. 30 tablet 3  . pravastatin (PRAVACHOL) 40 MG tablet Take 1 tablet (40 mg total) by mouth at bedtime. 30 tablet 5  . benazepril (LOTENSIN) 40 MG tablet Take 40 mg by mouth daily.  5   No current facility-administered medications for this visit.    OBJECTIVE:  Filed Vitals:   01/19/15 1349  BP: 137/83  Pulse: 111  Temp: 98.1 F (36.7 C)  Resp: 18     Body mass index is 44.57 kg/(m^2).    ECOG FS:1 - Symptomatic but completely ambulatory  PHYSICAL EXAM: Gen. status: Moderately of obese lady in mild distress because of exertion.  Oxygen never dropped to 87% on exertion Lungs: Diminished air entry on both sides.  No rhonchi or rales Cardiac: Tachycardia Lymphatic system: Supraclavicular, cervical, axillary, inguinal lymph nodes are not palpable Abdominal exam revealed normal bowel sounds. The abdomen was soft, non-tender, and without masses,  organomegaly, or appreciable enlargement of the abdominal aorta. Examination of the skin revealed no evidence of significant rashes, suspicious appearing nevi or other concerning lesions. Neurologically, the patient was awake, alert, and oriented to person, place and time. There were no obvious focal neurologic abnormalities. Skin: Herpes zoster on the left lower back   LAB RESULTS:  CBC Latest Ref Rng 01/19/2015 01/05/2015  WBC 3.6 - 11.0 K/uL 7.0 5.7  Hemoglobin 12.0 - 16.0 g/dL 12.4 12.4  Hematocrit 35.0 - 47.0 % 38.0 37.4  Platelets 150 - 440 K/uL 234 255    Appointment on 01/19/2015  Component Date Value Ref Range Status  . WBC 01/19/2015 7.0  3.6 - 11.0 K/uL Final  . RBC 01/19/2015 4.28  3.80 - 5.20 MIL/uL Final  . Hemoglobin 01/19/2015 12.4  12.0 - 16.0 g/dL Final  . HCT 01/19/2015 38.0  35.0 - 47.0 % Final  . MCV 01/19/2015 88.8  80.0 - 100.0 fL Final  . MCH 01/19/2015 29.0  26.0 - 34.0 pg Final  . MCHC 01/19/2015 32.6  32.0 - 36.0 g/dL Final  . RDW 01/19/2015 21.5* 11.5 - 14.5 % Final  . Platelets 01/19/2015 234  150 - 440 K/uL Final  . Neutrophils Relative % 01/19/2015 78   Final  . Neutro Abs 01/19/2015 5.5  1.4 - 6.5 K/uL Final  . Lymphocytes Relative 01/19/2015 5   Final  . Lymphs Abs 01/19/2015 0.4* 1.0 - 3.6 K/uL Final  . Monocytes Relative 01/19/2015 14   Final  . Monocytes Absolute 01/19/2015 0.9  0.2 - 0.9 K/uL Final  . Eosinophils Relative 01/19/2015 2   Final  . Eosinophils Absolute 01/19/2015 0.1  0 - 0.7 K/uL Final  . Basophils Relative 01/19/2015 1   Final  . Basophils Absolute 01/19/2015 0.1  0 - 0.1 K/uL Final       ASSESSMENT: Squamous cell carcinoma of lung right lower lobe positive mediastinal lymph node.  4 x 5 cm left lower lobe mass . supraclavicular lymph  node Stage IV disease Postherpetic neuralgia Gabapentin and Vicodin is controlling pain will continue that. Herpetic neuralgia is improving   MEDICAL DECISION MAKING:  All lab data has  been reviewed Continue chemotherapy  day 8 cycle 3. Continue Vicodin and the gabapentin for postherpetic neuralgia Hypomagnesemia Magnesium level is 1.4 we will continue magnesium 2 g intravenously  Repeat CT scan or a PET scan depending on the insurance approval patient was instructed to make sure that a PET scan is planned in blood sugar is below 200   . Forest Gleason, MD   01/19/2015 1:59 PM                                     DEEPA BARTHEL (MR# [id number])

## 2015-01-31 ENCOUNTER — Ambulatory Visit
Admission: RE | Admit: 2015-01-31 | Discharge: 2015-01-31 | Disposition: A | Discharge status: Home / Self Care | Payer: PPO | Source: Ambulatory Visit | Attending: Oncology | Admitting: Oncology

## 2015-01-31 DIAGNOSIS — Z923 Personal history of irradiation: Secondary | ICD-10-CM | POA: Diagnosis not present

## 2015-01-31 DIAGNOSIS — R590 Localized enlarged lymph nodes: Secondary | ICD-10-CM | POA: Diagnosis not present

## 2015-01-31 DIAGNOSIS — C3491 Malignant neoplasm of unspecified part of right bronchus or lung: Secondary | ICD-10-CM | POA: Diagnosis not present

## 2015-01-31 DIAGNOSIS — Z0189 Encounter for other specified special examinations: Secondary | ICD-10-CM | POA: Insufficient documentation

## 2015-01-31 DIAGNOSIS — Z9221 Personal history of antineoplastic chemotherapy: Secondary | ICD-10-CM | POA: Insufficient documentation

## 2015-01-31 DIAGNOSIS — K5730 Diverticulosis of large intestine without perforation or abscess without bleeding: Secondary | ICD-10-CM | POA: Diagnosis not present

## 2015-01-31 DIAGNOSIS — E119 Type 2 diabetes mellitus without complications: Secondary | ICD-10-CM | POA: Diagnosis present

## 2015-01-31 LAB — GLUCOSE, CAPILLARY: GLUCOSE-CAPILLARY: 142 mg/dL — AB (ref 65–99)

## 2015-01-31 MED ORDER — FLUDEOXYGLUCOSE F - 18 (FDG) INJECTION
12.7000 | Freq: Once | INTRAVENOUS | Status: AC | PRN
  Administered 2015-01-31: 12.7 via INTRAVENOUS

## 2015-02-02 ENCOUNTER — Inpatient Hospital Stay: Payer: PPO | Attending: Oncology

## 2015-02-02 ENCOUNTER — Inpatient Hospital Stay (HOSPITAL_BASED_OUTPATIENT_CLINIC_OR_DEPARTMENT_OTHER): Payer: PPO | Admitting: Oncology

## 2015-02-02 VITALS — BP 121/74 | HR 114 | Temp 96.1°F | Resp 18 | Wt 273.8 lb

## 2015-02-02 DIAGNOSIS — C3491 Malignant neoplasm of unspecified part of right bronchus or lung: Secondary | ICD-10-CM

## 2015-02-02 DIAGNOSIS — C3490 Malignant neoplasm of unspecified part of unspecified bronchus or lung: Secondary | ICD-10-CM

## 2015-02-02 DIAGNOSIS — J449 Chronic obstructive pulmonary disease, unspecified: Secondary | ICD-10-CM

## 2015-02-02 DIAGNOSIS — Z79899 Other long term (current) drug therapy: Secondary | ICD-10-CM

## 2015-02-02 DIAGNOSIS — F418 Other specified anxiety disorders: Secondary | ICD-10-CM

## 2015-02-02 DIAGNOSIS — G629 Polyneuropathy, unspecified: Secondary | ICD-10-CM

## 2015-02-02 DIAGNOSIS — Z7984 Long term (current) use of oral hypoglycemic drugs: Secondary | ICD-10-CM | POA: Diagnosis not present

## 2015-02-02 DIAGNOSIS — Z8673 Personal history of transient ischemic attack (TIA), and cerebral infarction without residual deficits: Secondary | ICD-10-CM | POA: Insufficient documentation

## 2015-02-02 DIAGNOSIS — Z87891 Personal history of nicotine dependence: Secondary | ICD-10-CM | POA: Diagnosis not present

## 2015-02-02 DIAGNOSIS — C3432 Malignant neoplasm of lower lobe, left bronchus or lung: Secondary | ICD-10-CM

## 2015-02-02 DIAGNOSIS — E785 Hyperlipidemia, unspecified: Secondary | ICD-10-CM | POA: Insufficient documentation

## 2015-02-02 DIAGNOSIS — E119 Type 2 diabetes mellitus without complications: Secondary | ICD-10-CM | POA: Insufficient documentation

## 2015-02-02 DIAGNOSIS — Z801 Family history of malignant neoplasm of trachea, bronchus and lung: Secondary | ICD-10-CM

## 2015-02-02 DIAGNOSIS — Z800 Family history of malignant neoplasm of digestive organs: Secondary | ICD-10-CM

## 2015-02-02 DIAGNOSIS — R5383 Other fatigue: Secondary | ICD-10-CM

## 2015-02-02 DIAGNOSIS — E1165 Type 2 diabetes mellitus with hyperglycemia: Secondary | ICD-10-CM

## 2015-02-02 DIAGNOSIS — Z923 Personal history of irradiation: Secondary | ICD-10-CM | POA: Insufficient documentation

## 2015-02-02 DIAGNOSIS — E669 Obesity, unspecified: Secondary | ICD-10-CM

## 2015-02-02 DIAGNOSIS — R531 Weakness: Secondary | ICD-10-CM | POA: Insufficient documentation

## 2015-02-02 DIAGNOSIS — I10 Essential (primary) hypertension: Secondary | ICD-10-CM

## 2015-02-02 DIAGNOSIS — IMO0002 Reserved for concepts with insufficient information to code with codable children: Secondary | ICD-10-CM

## 2015-02-02 DIAGNOSIS — IMO0001 Reserved for inherently not codable concepts without codable children: Secondary | ICD-10-CM

## 2015-02-02 LAB — CBC WITH DIFFERENTIAL/PLATELET
BASOS PCT: 0 %
Basophils Absolute: 0 10*3/uL (ref 0–0.1)
Eosinophils Absolute: 0.1 10*3/uL (ref 0–0.7)
Eosinophils Relative: 1 %
HEMATOCRIT: 36.8 % (ref 35.0–47.0)
HEMOGLOBIN: 12.3 g/dL (ref 12.0–16.0)
LYMPHS ABS: 0.5 10*3/uL — AB (ref 1.0–3.6)
Lymphocytes Relative: 7 %
MCH: 30 pg (ref 26.0–34.0)
MCHC: 33.3 g/dL (ref 32.0–36.0)
MCV: 90 fL (ref 80.0–100.0)
MONOS PCT: 11 %
Monocytes Absolute: 0.9 10*3/uL (ref 0.2–0.9)
NEUTROS ABS: 6.1 10*3/uL (ref 1.4–6.5)
NEUTROS PCT: 81 %
Platelets: 225 10*3/uL (ref 150–440)
RBC: 4.09 MIL/uL (ref 3.80–5.20)
RDW: 21.6 % — ABNORMAL HIGH (ref 11.5–14.5)
WBC: 7.6 10*3/uL (ref 3.6–11.0)

## 2015-02-02 LAB — MAGNESIUM: Magnesium: 1.8 mg/dL (ref 1.7–2.4)

## 2015-02-02 NOTE — Progress Notes (Signed)
Sherry Mueller (MR# [id number])     [place] @ [facility] [phone]) [phone]  Fax:(336) [place] OB: 1940/07/22  MR#: [id number]  PQZ#:[id number]  Patient Care Team: Arnetha Courser, MD as PCP - General (Family Medicine) Arnetha Courser, MD (Family Medicine)  CHIEF COMPLAINT:  Chief Complaint  Patient presents with  . Lung Cancer    1.  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 Status post radiation therapy but patient did not have any significant amount of chemotherapy only one dose of carboplatinum has patient declined chemotherapy along with radiation treatment because of side effect of radiation therapy Diagnosis in July of 2016 2.  Progressive disease in the left lower lobe on recent CT scan of November 2 016 Patient is starting carboplatinum and Abraxane (November, 2016) 3. patient has finished 4 cycles of carboplatinum and Taxol in January of 2017     INTERVAL HISTORY:  75 year old lady with moderate obesity and insulin-dependent diabetes came today further follow-up has shortness of breath on exertion patient is using oxygen at home.   Patient is here for ongoing evaluation and treatment consideration.  This is the beginning of cycle 3   Patient still having pain with shingles. 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 According to patient seek and take no deep breath and breathing has improved.  Shortness of breath is better.  No tingling.  No numbness.  No chills.  No fever. Patient had a PET scan done.  Patient has finished total 4 cycles of chemotherapy with carboplatinum and Taxol Continues to have shortness of breath but cough has improved. Postherpetic neuralgia is getting better. Diabetes is under control.       REVIEW OF SYSTEMS:   Gen. status: Patient is in  mild distress because of herpetic neuralgia Feeling weak and tired.  No chills.  No fever. GI: No nausea no vomiting no diarrhea. GU: No dysuria or hematuria Skin: Herpetic rash in the left gluteal area has been better.  But postherpetic neurology a continues to Lungs: Shortness of breath : No chest pain. Lower extremity no swelling neurological system no headache no dizziness As an continues to have postherpetic neuralgia in the low back area Neurological system: No headache.  No dizziness.  No other focal symptoms    As per HPI. Otherwise, a complete review of systems is negatve.  PAST MEDICAL HISTORY: Past Medical History  Diagnosis Date  . Stroke ([place])   . Hyperlipidemia   . Cancer ([place])   . Hypertension   . COPD (chronic obstructive pulmonary disease) ([place])   . Shortness of breath dyspnea   . Diabetes mellitus without complication ([place])   . Depression   . Anxiety   . Headache     PAST SURGICAL HISTORY: Past Surgical History  Procedure Laterality Date  . Breast surgery Left cyst removed  . Cardiac catheterization    . Colonoscopy    . Endobronchial ultrasound Right 07/08/2014    Procedure: ENDOBRONCHIAL ULTRASOUND;  Surgeon: Vilinda Boehringer, MD;  Location: [facility] ORS;  Service: Cardiopulmonary;  Laterality: Right;    FAMILY HISTORY Family History  Problem Relation Age of Onset  . Heart disease Mother   . Hypertension Mother   . Congestive Heart Failure Mother   . Heart disease Father   . Stroke Father   .  Cancer Father     colon  . Cancer Maternal Uncle     lung  . Lung disease Maternal Grandmother   . Alcohol abuse Maternal Grandfather   . Cancer Paternal Grandmother   . Stroke Paternal Grandmother     ADVANCED DIRECTIVES:  No flowsheet data found.  HEALTH MAINTENANCE: Social History  Substance Use Topics  . Smoking status: Former Smoker -- 2.00 packs/day    Types: Cigarettes    Quit date: 06/14/2004  . Smokeless tobacco: Never Used  . Alcohol Use:  No      Allergies  Allergen Reactions  . Byetta 10 Mcg Pen [Exenatide] Nausea Only  . Hctz [Hydrochlorothiazide] Nausea Only  . Penicillins Itching  . Plavix [Clopidogrel] Nausea Only  . Plavix [Clopidogrel] Nausea And Vomiting    Current Outpatient Prescriptions  Medication Sig Dispense Refill  . albuterol (PROVENTIL HFA;VENTOLIN HFA) 108 (90 Base) MCG/ACT inhaler Inhale 2 puffs into the lungs every 4 (four) hours as needed for wheezing or shortness of breath. 1 Inhaler 2  . amLODipine (NORVASC) 5 MG tablet TAKE 1 TABLET BY MOUTH EVERY DAY 30 tablet 11  . B Complex Vitamins (B COMPLEX PO) Take 1 tablet by mouth every morning.    . benazepril (LOTENSIN) 20 MG tablet Take 1 tablet (20 mg total) by mouth daily. 90 tablet 3  . benazepril (LOTENSIN) 40 MG tablet Take 40 mg by mouth daily.  5  . busPIRone (BUSPAR) 10 MG tablet Take 10 mg by mouth 2 (two) times daily as needed.    . Cholecalciferol (VITAMIN D3) 2000 UNITS TABS Take 1 tablet by mouth every morning.    . citalopram (CELEXA) 40 MG tablet TAKE 1 TABLET BY MOUTH AT BEDTIME 30 tablet 2  . co-enzyme Q-10 30 MG capsule Take 30 mg by mouth daily.    . fluticasone (FLONASE) 50 MCG/ACT nasal spray Place 2 sprays into both nostrils daily. 15.8 g 11  . Fluticasone-Salmeterol (ADVAIR DISKUS) 250-50 MCG/DOSE AEPB Inhale 1 puff into the lungs as needed.    . gabapentin (NEURONTIN) 300 MG capsule Take 1 capsule (300 mg total) by mouth 2 (two) times daily. 60 capsule 0  . glimepiride (AMARYL) 4 MG tablet TAKE 1 TABLET BY MOUTH EVERY DAY 30 tablet 5  . glucose blood test strip OneTouch Ultra test strips; check glucose 3x a day; LON 99 months; E11.65 100 each 11  . glucose monitoring kit (FREESTYLE) monitoring kit 1 each by Does not apply route as needed for other.    . guaiFENesin-codeine 100-10 MG/5ML syrup Take 5 mLs by mouth 3 (three) times daily as needed for cough. 120 mL 2  . HYDROcodone-acetaminophen (NORCO/VICODIN) 5-325 MG tablet Take  1 tablet by mouth every 6 (six) hours as needed for moderate pain. 30 tablet 0  . isosorbide mononitrate (IMDUR) 30 MG 24 hr tablet TAKE 1 TABLET BY MOUTH EVERY DAY 30 tablet 5  . lansoprazole (PREVACID) 30 MG capsule Take 1 capsule (30 mg total) by mouth daily at 12 noon. 30 capsule 2  . linagliptin (TRADJENTA) 5 MG TABS tablet Take 1 tablet (5 mg total) by mouth daily. 30 tablet 2  . MAG64 535 (64 Mg) MG TBCR TAKE 1 TABLET (64 MG TOTAL) BY MOUTH 2 (TWO) TIMES DAILY. 60 tablet 0  . metFORMIN (GLUCOPHAGE-XR) 500 MG 24 hr tablet Take 2 tablets (1,000 mg total) by mouth 2 (two) times daily. Take 2 tablets twice a day 360 tablet 1  . Multiple Vitamins-Minerals (MULTIVITAMIN  PO) Take 1 tablet by mouth 2 (two) times daily.    . Omega-3 Fatty Acids (FISH OIL PO) Take 1,000 mg by mouth every morning.    . ondansetron (ZOFRAN) 4 MG tablet Take 1 tablet (4 mg total) by mouth every 6 (six) hours as needed for nausea or vomiting. 30 tablet 3  . pravastatin (PRAVACHOL) 40 MG tablet Take 1 tablet (40 mg total) by mouth at bedtime. 30 tablet 5   No current facility-administered medications for this visit.    OBJECTIVE:  Filed Vitals:   02/02/15 1350  BP: 121/74  Pulse: 114  Temp: 96.1 F (35.6 C)  Resp: 18     Body mass index is 44.22 kg/(m^2).    ECOG FS:1 - Symptomatic but completely ambulatory  PHYSICAL EXAM: Gen. status: Moderately of obese lady in mild distress because of exertion.  Oxygen never dropped to 87% on exertion Lungs: Diminished air entry on both sides.  No rhonchi or rales Cardiac: Tachycardia Lymphatic system: Supraclavicular, cervical, axillary, inguinal lymph nodes are not palpable Abdominal exam revealed normal bowel sounds. The abdomen was soft, non-tender, and without masses, organomegaly, or appreciable enlargement of the abdominal aorta. Examination of the skin revealed no evidence of significant rashes, suspicious appearing nevi or other concerning lesions. Neurologically,  the patient was awake, alert, and oriented to person, place and time. There were no obvious focal neurologic abnormalities. Skin: Herpes zoster on the left lower back healing well   LAB RESULTS:  CBC Latest Ref Rng 02/02/2015 01/19/2015  WBC 3.6 - 11.0 K/uL 7.6 7.0  Hemoglobin 12.0 - 16.0 g/dL 12.3 12.4  Hematocrit 35.0 - 47.0 % 36.8 38.0  Platelets 150 - 440 K/uL 225 234    Appointment on 02/02/2015  Component Date Value Ref Range Status  . WBC 02/02/2015 7.6  3.6 - 11.0 K/uL Final  . RBC 02/02/2015 4.09  3.80 - 5.20 MIL/uL Final  . Hemoglobin 02/02/2015 12.3  12.0 - 16.0 g/dL Final  . HCT 02/02/2015 36.8  35.0 - 47.0 % Final  . MCV 02/02/2015 90.0  80.0 - 100.0 fL Final  . MCH 02/02/2015 30.0  26.0 - 34.0 pg Final  . MCHC 02/02/2015 33.3  32.0 - 36.0 g/dL Final  . RDW 02/02/2015 21.6* 11.5 - 14.5 % Final  . Platelets 02/02/2015 225  150 - 440 K/uL Final  . Neutrophils Relative % 02/02/2015 81   Final  . Neutro Abs 02/02/2015 6.1  1.4 - 6.5 K/uL Final  . Lymphocytes Relative 02/02/2015 7   Final  . Lymphs Abs 02/02/2015 0.5* 1.0 - 3.6 K/uL Final  . Monocytes Relative 02/02/2015 11   Final  . Monocytes Absolute 02/02/2015 0.9  0.2 - 0.9 K/uL Final  . Eosinophils Relative 02/02/2015 1   Final  . Eosinophils Absolute 02/02/2015 0.1  0 - 0.7 K/uL Final  . Basophils Relative 02/02/2015 0   Final  . Basophils Absolute 02/02/2015 0.0  0 - 0.1 K/uL Final  . Magnesium 02/02/2015 1.8  1.7 - 2.4 mg/dL Final  Hospital Outpatient Visit on 01/31/2015  Component Date Value Ref Range Status  . Glucose-Capillary 01/31/2015 142* 65 - 99 mg/dL Final       ASSESSMENT: Squamous cell carcinoma of lung right lower lobe positive mediastinal lymph node.  4 x 5 cm left lower lobe mass . supraclavicular lymph node Stage IV disease Postherpetic neuralgia Gabapentin and Vicodin is controlling pain will continue that. Herpetic neuralgia is improving   MEDICAL DECISION MAKING:  All  lab data has  been reviewed Continue chemotherapy  day 8 cycle 3. Continue Vicodin and the gabapentin for postherpetic neuralgia Hypomagnesemia Magnesium level is 1.8 6 and will continue oral magnesium PET scan has been reviewed independently and reviewed with the patient.  There is some evidence of progressive disease in the left lower lobe At present time symptomatically patient has improved Possibility of considering protocol therapy or off protocol assessment of PDL 1 followed by anti-PDL medication would be considered. I discussed with the patient and family. PDL 1 has been ordered on the tissue Contacted research trials to consider evaluation for protocol  . Forest Gleason, MD   02/02/2015 2:34 PM                                     Sherry Mueller (MR# [id number])

## 2015-02-02 NOTE — Progress Notes (Signed)
Patient here today for CT results. 

## 2015-02-04 ENCOUNTER — Encounter: Payer: Self-pay | Admitting: Oncology

## 2015-02-24 ENCOUNTER — Inpatient Hospital Stay: Payer: PPO | Admitting: Oncology

## 2015-03-03 ENCOUNTER — Inpatient Hospital Stay: Payer: PPO | Attending: Oncology | Admitting: Oncology

## 2015-03-03 VITALS — BP 124/75 | HR 118 | Temp 98.6°F | Resp 18 | Wt 268.7 lb

## 2015-03-03 DIAGNOSIS — Z801 Family history of malignant neoplasm of trachea, bronchus and lung: Secondary | ICD-10-CM | POA: Insufficient documentation

## 2015-03-03 DIAGNOSIS — Z8673 Personal history of transient ischemic attack (TIA), and cerebral infarction without residual deficits: Secondary | ICD-10-CM | POA: Diagnosis not present

## 2015-03-03 DIAGNOSIS — Z800 Family history of malignant neoplasm of digestive organs: Secondary | ICD-10-CM

## 2015-03-03 DIAGNOSIS — C3432 Malignant neoplasm of lower lobe, left bronchus or lung: Secondary | ICD-10-CM | POA: Insufficient documentation

## 2015-03-03 DIAGNOSIS — M4316 Spondylolisthesis, lumbar region: Secondary | ICD-10-CM | POA: Insufficient documentation

## 2015-03-03 DIAGNOSIS — I10 Essential (primary) hypertension: Secondary | ICD-10-CM | POA: Diagnosis not present

## 2015-03-03 DIAGNOSIS — Z87891 Personal history of nicotine dependence: Secondary | ICD-10-CM

## 2015-03-03 DIAGNOSIS — Z5111 Encounter for antineoplastic chemotherapy: Secondary | ICD-10-CM | POA: Diagnosis not present

## 2015-03-03 DIAGNOSIS — C779 Secondary and unspecified malignant neoplasm of lymph node, unspecified: Secondary | ICD-10-CM | POA: Diagnosis not present

## 2015-03-03 DIAGNOSIS — Z79899 Other long term (current) drug therapy: Secondary | ICD-10-CM | POA: Diagnosis not present

## 2015-03-03 DIAGNOSIS — M47816 Spondylosis without myelopathy or radiculopathy, lumbar region: Secondary | ICD-10-CM | POA: Diagnosis not present

## 2015-03-03 DIAGNOSIS — R0602 Shortness of breath: Secondary | ICD-10-CM | POA: Diagnosis not present

## 2015-03-03 DIAGNOSIS — IMO0001 Reserved for inherently not codable concepts without codable children: Secondary | ICD-10-CM

## 2015-03-03 DIAGNOSIS — I2510 Atherosclerotic heart disease of native coronary artery without angina pectoris: Secondary | ICD-10-CM | POA: Diagnosis not present

## 2015-03-03 DIAGNOSIS — C7801 Secondary malignant neoplasm of right lung: Secondary | ICD-10-CM | POA: Diagnosis not present

## 2015-03-03 DIAGNOSIS — C7951 Secondary malignant neoplasm of bone: Secondary | ICD-10-CM | POA: Diagnosis not present

## 2015-03-03 DIAGNOSIS — E119 Type 2 diabetes mellitus without complications: Secondary | ICD-10-CM | POA: Insufficient documentation

## 2015-03-03 DIAGNOSIS — C3491 Malignant neoplasm of unspecified part of right bronchus or lung: Secondary | ICD-10-CM

## 2015-03-03 DIAGNOSIS — Z923 Personal history of irradiation: Secondary | ICD-10-CM | POA: Diagnosis not present

## 2015-03-03 DIAGNOSIS — J90 Pleural effusion, not elsewhere classified: Secondary | ICD-10-CM | POA: Diagnosis not present

## 2015-03-03 DIAGNOSIS — Z7984 Long term (current) use of oral hypoglycemic drugs: Secondary | ICD-10-CM | POA: Insufficient documentation

## 2015-03-03 DIAGNOSIS — R5383 Other fatigue: Secondary | ICD-10-CM | POA: Insufficient documentation

## 2015-03-03 DIAGNOSIS — E785 Hyperlipidemia, unspecified: Secondary | ICD-10-CM | POA: Diagnosis not present

## 2015-03-03 DIAGNOSIS — R160 Hepatomegaly, not elsewhere classified: Secondary | ICD-10-CM | POA: Insufficient documentation

## 2015-03-03 DIAGNOSIS — R350 Frequency of micturition: Secondary | ICD-10-CM | POA: Diagnosis not present

## 2015-03-03 DIAGNOSIS — B0229 Other postherpetic nervous system involvement: Secondary | ICD-10-CM | POA: Insufficient documentation

## 2015-03-03 DIAGNOSIS — J449 Chronic obstructive pulmonary disease, unspecified: Secondary | ICD-10-CM | POA: Diagnosis not present

## 2015-03-03 DIAGNOSIS — F418 Other specified anxiety disorders: Secondary | ICD-10-CM | POA: Insufficient documentation

## 2015-03-03 DIAGNOSIS — E669 Obesity, unspecified: Secondary | ICD-10-CM | POA: Diagnosis not present

## 2015-03-03 DIAGNOSIS — E1165 Type 2 diabetes mellitus with hyperglycemia: Secondary | ICD-10-CM

## 2015-03-03 DIAGNOSIS — Z794 Long term (current) use of insulin: Secondary | ICD-10-CM | POA: Diagnosis not present

## 2015-03-03 DIAGNOSIS — R531 Weakness: Secondary | ICD-10-CM | POA: Insufficient documentation

## 2015-03-06 ENCOUNTER — Encounter: Payer: Self-pay | Admitting: Oncology

## 2015-03-06 NOTE — Progress Notes (Signed)
Sherry Mueller (MR# [id number])     [place] @ [facility] - Closed [phone]) [phone]  Fax:([phone]) [place] OB: 10/20/1940  MR#: [id number]  DGU#:[id number]  Patient Care Team: Arnetha Courser, MD as PCP - General (Family Medicine) Arnetha Courser, MD (Family Medicine)  CHIEF COMPLAINT:  Chief Complaint  Patient presents with  . Lung Cancer    1.  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 Status post radiation therapy but patient did not have any significant amount of chemotherapy only one dose of carboplatinum has patient declined chemotherapy along with radiation treatment because of side effect of radiation therapy Diagnosis in July of 2016 2.  Progressive disease in the left lower lobe on recent CT scan of November 2 016 Patient is starting carboplatinum and Abraxane (November, 2016) 3. patient has finished 4 cycles of carboplatinum and Taxol in January of 2017 4.Repeat CT scan and PET scan shows progressive disease with possible new onset of bone metastases since spine     INTERVAL HISTORY:  75 year old lady with moderate obesity and insulin-dependent diabetes came today further follow-up has shortness of breath on exertion patient is using oxygen at home.   Patient is here for ongoing evaluation and treatment consideration.  This is the beginning of cycle 3 Patient had a repeat PET scan done which has been reviewed independently.  Patient has increasing cough and shortness of breath since last evaluation. Dear for further follow-up and treatment consideration       REVIEW OF SYSTEMS:   Gen. Status:Moderately obese lady not in acute distress at the present time.  Postherpetic neuralgia is improving. Feeling weak and tired.  No chills.  No fever. GI: No nausea no vomiting no diarrhea. GU: No dysuria or hematuria Skin: Herpetic rash in the  left gluteal area has been better.  But postherpetic neurology a continues to Lungs: Shortness of breath : No chest pain. Increasing cough.  No hemoptysis. Lower extremity no swelling neurological system no headache no dizziness As an continues to have postherpetic neuralgia in the low back area Neurological system: No headache.  No dizziness.  No other focal symptoms    As per HPI. Otherwise, a complete review of systems is negatve.  PAST MEDICAL HISTORY: Past Medical History  Diagnosis Date  . Stroke ([place])   . Hyperlipidemia   . Cancer ([place])   . Hypertension   . COPD (chronic obstructive pulmonary disease) ([place])   . Shortness of breath dyspnea   . Diabetes mellitus without complication ([place])   . Depression   . Anxiety   . Headache     PAST SURGICAL HISTORY: Past Surgical History  Procedure Laterality Date  . Breast surgery Left cyst removed  . Cardiac catheterization    . Colonoscopy    . Endobronchial ultrasound Right 07/08/2014    Procedure: ENDOBRONCHIAL ULTRASOUND;  Surgeon: Vilinda Boehringer, MD;  Location: [facility] ORS;  Service: Cardiopulmonary;  Laterality: Right;    FAMILY HISTORY Family History  Problem Relation Age of Onset  . Heart disease Mother   . Hypertension Mother   . Congestive Heart Failure Mother   . Heart disease Father   . Stroke Father   . Cancer Father     colon  . Cancer Maternal Uncle     lung  . Lung disease Maternal Grandmother   . Alcohol abuse  Maternal Grandfather   . Cancer Paternal Grandmother   . Stroke Paternal Grandmother     ADVANCED DIRECTIVES:  No flowsheet data found.  HEALTH MAINTENANCE: Social History  Substance Use Topics  . Smoking status: Former Smoker -- 2.00 packs/day    Types: Cigarettes    Quit date: 06/14/2004  . Smokeless tobacco: Never Used  . Alcohol Use: No      Allergies  Allergen Reactions  . Byetta 10 Mcg Pen [Exenatide] Nausea Only  . Hctz [Hydrochlorothiazide] Nausea Only  . Penicillins Itching   . Plavix [Clopidogrel] Nausea Only  . Plavix [Clopidogrel] Nausea And Vomiting    Current Outpatient Prescriptions  Medication Sig Dispense Refill  . albuterol (PROVENTIL HFA;VENTOLIN HFA) 108 (90 Base) MCG/ACT inhaler Inhale 2 puffs into the lungs every 4 (four) hours as needed for wheezing or shortness of breath. 1 Inhaler 2  . amLODipine (NORVASC) 5 MG tablet TAKE 1 TABLET BY MOUTH EVERY DAY 30 tablet 11  . B Complex Vitamins (B COMPLEX PO) Take 1 tablet by mouth every morning.    . benazepril (LOTENSIN) 20 MG tablet Take 1 tablet (20 mg total) by mouth daily. 90 tablet 3  . benazepril (LOTENSIN) 40 MG tablet Take 40 mg by mouth daily.  5  . busPIRone (BUSPAR) 10 MG tablet Take 10 mg by mouth 2 (two) times daily as needed.    . Cholecalciferol (VITAMIN D3) 2000 UNITS TABS Take 1 tablet by mouth every morning.    . citalopram (CELEXA) 40 MG tablet TAKE 1 TABLET BY MOUTH AT BEDTIME 30 tablet 2  . co-enzyme Q-10 30 MG capsule Take 30 mg by mouth daily.    . fluticasone (FLONASE) 50 MCG/ACT nasal spray Place 2 sprays into both nostrils daily. 15.8 g 11  . Fluticasone-Salmeterol (ADVAIR DISKUS) 250-50 MCG/DOSE AEPB Inhale 1 puff into the lungs as needed.    . gabapentin (NEURONTIN) 300 MG capsule Take 1 capsule (300 mg total) by mouth 2 (two) times daily. 60 capsule 0  . glimepiride (AMARYL) 4 MG tablet TAKE 1 TABLET BY MOUTH EVERY DAY 30 tablet 5  . glucose blood test strip OneTouch Ultra test strips; check glucose 3x a day; LON 99 months; E11.65 100 each 11  . glucose monitoring kit (FREESTYLE) monitoring kit 1 each by Does not apply route as needed for other.    . guaiFENesin-codeine 100-10 MG/5ML syrup Take 5 mLs by mouth 3 (three) times daily as needed for cough. 120 mL 2  . HYDROcodone-acetaminophen (NORCO/VICODIN) 5-325 MG tablet Take 1 tablet by mouth every 6 (six) hours as needed for moderate pain. 30 tablet 0  . isosorbide mononitrate (IMDUR) 30 MG 24 hr tablet TAKE 1 TABLET BY MOUTH  EVERY DAY 30 tablet 5  . lansoprazole (PREVACID) 30 MG capsule Take 1 capsule (30 mg total) by mouth daily at 12 noon. 30 capsule 2  . linagliptin (TRADJENTA) 5 MG TABS tablet Take 1 tablet (5 mg total) by mouth daily. 30 tablet 2  . MAG64 535 (64 Mg) MG TBCR TAKE 1 TABLET (64 MG TOTAL) BY MOUTH 2 (TWO) TIMES DAILY. 60 tablet 0  . metFORMIN (GLUCOPHAGE-XR) 500 MG 24 hr tablet Take 2 tablets (1,000 mg total) by mouth 2 (two) times daily. Take 2 tablets twice a day 360 tablet 1  . Multiple Vitamins-Minerals (MULTIVITAMIN PO) Take 1 tablet by mouth 2 (two) times daily.    . Omega-3 Fatty Acids (FISH OIL PO) Take 1,000 mg by mouth every morning.    Marland Kitchen  ondansetron (ZOFRAN) 4 MG tablet Take 1 tablet (4 mg total) by mouth every 6 (six) hours as needed for nausea or vomiting. 30 tablet 3  . pravastatin (PRAVACHOL) 40 MG tablet Take 1 tablet (40 mg total) by mouth at bedtime. 30 tablet 5  . valACYclovir (VALTREX) 1000 MG tablet      No current facility-administered medications for this visit.    OBJECTIVE:  Filed Vitals:   03/03/15 1108  BP: 124/75  Pulse: 118  Temp: 98.6 F (37 C)  Resp: 18     Body mass index is 43.4 kg/(m^2).    ECOG FS:1 - Symptomatic but completely ambulatory  PHYSICAL EXAM: Gen. status: Moderately of obese lady in mild distress because of exertion.  Oxygen never dropped to 87% on exertion Lungs: Diminished air entry on both sides.  No rhonchi or rales Cardiac: Tachycardia Lymphatic system: Supraclavicular, cervical, axillary, inguinal lymph nodes are not palpable Abdominal exam revealed normal bowel sounds. The abdomen was soft, non-tender, and without masses, organomegaly, or appreciable enlargement of the abdominal aorta. Examination of the skin revealed no evidence of significant rashes, suspicious appearing nevi or other concerning lesions. Neurologically, the patient was awake, alert, and oriented to person, place and time. There were no obvious focal neurologic  abnormalities. Skin: Herpes zoster on the left lower back healing well   LAB RESULTS:  CBC Latest Ref Rng 02/02/2015 01/19/2015  WBC 3.6 - 11.0 K/uL 7.6 7.0  Hemoglobin 12.0 - 16.0 g/dL 12.3 12.4  Hematocrit 35.0 - 47.0 % 36.8 38.0  Platelets 150 - 440 K/uL 225 234    No visits with results within 5 Day(s) from this visit. Latest known visit with results is:  Appointment on 02/02/2015  Component Date Value Ref Range Status  . WBC 02/02/2015 7.6  3.6 - 11.0 K/uL Final  . RBC 02/02/2015 4.09  3.80 - 5.20 MIL/uL Final  . Hemoglobin 02/02/2015 12.3  12.0 - 16.0 g/dL Final  . HCT 02/02/2015 36.8  35.0 - 47.0 % Final  . MCV 02/02/2015 90.0  80.0 - 100.0 fL Final  . MCH 02/02/2015 30.0  26.0 - 34.0 pg Final  . MCHC 02/02/2015 33.3  32.0 - 36.0 g/dL Final  . RDW 02/02/2015 21.6* 11.5 - 14.5 % Final  . Platelets 02/02/2015 225  150 - 440 K/uL Final  . Neutrophils Relative % 02/02/2015 81   Final  . Neutro Abs 02/02/2015 6.1  1.4 - 6.5 K/uL Final  . Lymphocytes Relative 02/02/2015 7   Final  . Lymphs Abs 02/02/2015 0.5* 1.0 - 3.6 K/uL Final  . Monocytes Relative 02/02/2015 11   Final  . Monocytes Absolute 02/02/2015 0.9  0.2 - 0.9 K/uL Final  . Eosinophils Relative 02/02/2015 1   Final  . Eosinophils Absolute 02/02/2015 0.1  0 - 0.7 K/uL Final  . Basophils Relative 02/02/2015 0   Final  . Basophils Absolute 02/02/2015 0.0  0 - 0.1 K/uL Final  . Magnesium 02/02/2015 1.8  1.7 - 2.4 mg/dL Final       ASSESSMENT: Squamous cell carcinoma of lung right lower lobe positive mediastinal lymph node.  4 x 5 cm left lower lobe mass . supraclavicular lymph node Stage IV disease Postherpetic neuralgia Gabapentin and Vicodin is controlling pain will continue that. Herpetic neuralgia is improving   MEDICAL DECISION MAKING:  All lab data has been reviewed Continue chemotherapy  day 8 cycle 3. Continue Vicodin and the gabapentin for postherpetic neuralgia Hypomagnesemia Magnesium level is  1.8 6  and will continue oral magnesium PET scan has been reviewed independently and reviewed with the patient.  There is some evidence of progressive disease in the left lower lobe Also there is a new area of concern in the thoracic spine.  Symptomatically patient is getting worse.  Suggesting progression of the disease. He had detailed discussion about available study regarding evaluating tissue for markers and appropriate targeted  treatment versus immunotherapy This has been discussed with the patient and family. discussed  situation with search coordinators.  Consent has been obtained to send tissue for further evaluation Total duration of visit was 35 minutes.  50% or more time was spent in counseling patient and family regarding prognosis and options of treatment and available resources . Forest Gleason, MD   03/06/2015 8:09 AM                                     Yates Decamp (MR# [id number])

## 2015-03-07 ENCOUNTER — Encounter: Payer: Self-pay | Admitting: Oncology

## 2015-03-08 ENCOUNTER — Other Ambulatory Visit: Payer: Self-pay | Admitting: Family Medicine

## 2015-03-08 NOTE — Telephone Encounter (Signed)
Jan Cr and K+ revivewed; benazepril previously filled for 20 mg strength; 40 mg refused; other Rxs approved

## 2015-03-15 ENCOUNTER — Other Ambulatory Visit: Payer: Self-pay | Admitting: *Deleted

## 2015-03-15 DIAGNOSIS — C3491 Malignant neoplasm of unspecified part of right bronchus or lung: Secondary | ICD-10-CM

## 2015-03-17 ENCOUNTER — Other Ambulatory Visit: Payer: Self-pay | Admitting: Family Medicine

## 2015-03-17 ENCOUNTER — Inpatient Hospital Stay: Payer: PPO

## 2015-03-17 ENCOUNTER — Encounter: Payer: Self-pay | Admitting: Family Medicine

## 2015-03-17 ENCOUNTER — Other Ambulatory Visit: Payer: Self-pay | Admitting: *Deleted

## 2015-03-17 DIAGNOSIS — C3491 Malignant neoplasm of unspecified part of right bronchus or lung: Secondary | ICD-10-CM

## 2015-03-17 DIAGNOSIS — Z5111 Encounter for antineoplastic chemotherapy: Secondary | ICD-10-CM | POA: Diagnosis not present

## 2015-03-17 HISTORY — DX: Hypomagnesemia: E83.42

## 2015-03-17 LAB — COMPREHENSIVE METABOLIC PANEL
ALBUMIN: 3.3 g/dL — AB (ref 3.5–5.0)
ALK PHOS: 67 U/L (ref 38–126)
ALT: 10 U/L — AB (ref 14–54)
AST: 15 U/L (ref 15–41)
Anion gap: 6 (ref 5–15)
BILIRUBIN TOTAL: 0.8 mg/dL (ref 0.3–1.2)
BUN: 7 mg/dL (ref 6–20)
CALCIUM: 11.5 mg/dL — AB (ref 8.9–10.3)
CO2: 34 mmol/L — ABNORMAL HIGH (ref 22–32)
CREATININE: 0.49 mg/dL (ref 0.44–1.00)
Chloride: 97 mmol/L — ABNORMAL LOW (ref 101–111)
GFR calc Af Amer: >60 mL/min (ref >60)
GLUCOSE: 105 mg/dL — AB (ref 65–99)
Potassium: 3.7 mmol/L (ref 3.5–5.1)
Sodium: 137 mmol/L (ref 135–145)
TOTAL PROTEIN: 7.5 g/dL (ref 6.5–8.1)

## 2015-03-17 LAB — CBC WITH DIFFERENTIAL/PLATELET
BASOS ABS: 0 10*3/uL (ref 0–0.1)
BASOS PCT: 0 %
Eosinophils Absolute: 0.1 10*3/uL (ref 0–0.7)
Eosinophils Relative: 1 %
HEMATOCRIT: 33.8 % — AB (ref 35.0–47.0)
HEMOGLOBIN: 11.5 g/dL — AB (ref 12.0–16.0)
LYMPHS PCT: 5 %
Lymphs Abs: 0.4 10*3/uL — ABNORMAL LOW (ref 1.0–3.6)
MCH: 30.8 pg (ref 26.0–34.0)
MCHC: 34 g/dL (ref 32.0–36.0)
MCV: 90.7 fL (ref 80.0–100.0)
MONO ABS: 0.7 10*3/uL (ref 0.2–0.9)
Monocytes Relative: 8 %
NEUTROS ABS: 7.7 10*3/uL — AB (ref 1.4–6.5)
NEUTROS PCT: 86 %
Platelets: 278 10*3/uL (ref 150–440)
RBC: 3.72 MIL/uL — ABNORMAL LOW (ref 3.80–5.20)
RDW: 15.7 % — AB (ref 11.5–14.5)
WBC: 9 10*3/uL (ref 3.6–11.0)

## 2015-03-17 LAB — AMYLASE: Amylase: 41 U/L (ref 28–100)

## 2015-03-17 LAB — LACTATE DEHYDROGENASE: LDH: 103 U/L (ref 98–192)

## 2015-03-17 LAB — TROPONIN I: Troponin I: <0.03 ng/mL (ref <0.031)

## 2015-03-17 LAB — TSH: TSH: 0.888 u[IU]/mL (ref 0.350–4.500)

## 2015-03-17 LAB — LIPASE, BLOOD: Lipase: 16 U/L (ref 11–51)

## 2015-03-17 LAB — T4, FREE: FREE T4: 0.98 ng/dL (ref 0.61–1.12)

## 2015-03-17 LAB — CK: Total CK: 14 U/L — ABNORMAL LOW (ref 38–234)

## 2015-03-17 LAB — MAGNESIUM: Magnesium: 1.1 mg/dL — ABNORMAL LOW (ref 1.7–2.4)

## 2015-03-17 MED ORDER — MAGNESIUM SULFATE 4 GM/100ML IV SOLN
4.0000 g | Freq: Once | INTRAVENOUS | Status: AC
  Administered 2015-03-17: 4 g via INTRAVENOUS
  Filled 2015-03-17: qty 100

## 2015-03-17 MED ORDER — SODIUM CHLORIDE 0.9 % IV SOLN: 4.0000 g | Freq: Once | INTRAVENOUS | Status: DC

## 2015-03-17 MED ORDER — MAGNESIUM SULFATE 4 GM/100ML IV SOLN: 4.0000 g | Freq: Once | INTRAVENOUS | Status: DC

## 2015-03-17 MED ORDER — SODIUM CHLORIDE 0.9 % IV SOLN
INTRAVENOUS | Status: DC
  Administered 2015-03-17: 11:00:00 via INTRAVENOUS
  Filled 2015-03-17: qty 1000

## 2015-03-18 ENCOUNTER — Inpatient Hospital Stay: Payer: PPO

## 2015-03-18 LAB — T4: T4, Total: 8.9 ug/dL (ref 4.5–12.0)

## 2015-03-18 LAB — T3: T3, Total: 109 ng/dL (ref 71–180)

## 2015-03-18 LAB — T3, FREE: T3 FREE: 2.7 pg/mL (ref 2.0–4.4)

## 2015-03-21 ENCOUNTER — Inpatient Hospital Stay: Payer: PPO

## 2015-03-21 ENCOUNTER — Other Ambulatory Visit: Payer: Self-pay | Admitting: Family Medicine

## 2015-03-21 ENCOUNTER — Ambulatory Visit: Payer: PPO

## 2015-03-21 DIAGNOSIS — Z5111 Encounter for antineoplastic chemotherapy: Secondary | ICD-10-CM | POA: Diagnosis not present

## 2015-03-21 DIAGNOSIS — C3490 Malignant neoplasm of unspecified part of unspecified bronchus or lung: Secondary | ICD-10-CM

## 2015-03-21 LAB — CBC WITH DIFFERENTIAL/PLATELET
BASOS ABS: 0.1 10*3/uL (ref 0–0.1)
Basophils Relative: 1 %
Eosinophils Absolute: 0.1 10*3/uL (ref 0–0.7)
Eosinophils Relative: 1 %
HEMATOCRIT: 34.6 % — AB (ref 35.0–47.0)
Hemoglobin: 11.7 g/dL — ABNORMAL LOW (ref 12.0–16.0)
LYMPHS PCT: 4 %
Lymphs Abs: 0.4 10*3/uL — ABNORMAL LOW (ref 1.0–3.6)
MCH: 30.2 pg (ref 26.0–34.0)
MCHC: 33.8 g/dL (ref 32.0–36.0)
MCV: 89.2 fL (ref 80.0–100.0)
MONO ABS: 0.7 10*3/uL (ref 0.2–0.9)
Monocytes Relative: 8 %
NEUTROS ABS: 7.7 10*3/uL — AB (ref 1.4–6.5)
Neutrophils Relative %: 86 %
Platelets: 310 10*3/uL (ref 150–440)
RBC: 3.88 MIL/uL (ref 3.80–5.20)
RDW: 15.6 % — ABNORMAL HIGH (ref 11.5–14.5)
WBC: 9 10*3/uL (ref 3.6–11.0)

## 2015-03-21 LAB — COMPREHENSIVE METABOLIC PANEL
ALT: 10 U/L — AB (ref 14–54)
AST: 18 U/L (ref 15–41)
Albumin: 3.3 g/dL — ABNORMAL LOW (ref 3.5–5.0)
Alkaline Phosphatase: 63 U/L (ref 38–126)
Anion gap: 6 (ref 5–15)
BILIRUBIN TOTAL: 0.8 mg/dL (ref 0.3–1.2)
BUN: 10 mg/dL (ref 6–20)
CO2: 32 mmol/L (ref 22–32)
CREATININE: 0.54 mg/dL (ref 0.44–1.00)
Calcium: 12.1 mg/dL — ABNORMAL HIGH (ref 8.9–10.3)
Chloride: 97 mmol/L — ABNORMAL LOW (ref 101–111)
GFR calc Af Amer: >60 mL/min (ref >60)
Glucose, Bld: 115 mg/dL — ABNORMAL HIGH (ref 65–99)
Potassium: 3.8 mmol/L (ref 3.5–5.1)
Sodium: 135 mmol/L (ref 135–145)
TOTAL PROTEIN: 7.1 g/dL (ref 6.5–8.1)

## 2015-03-21 LAB — MAGNESIUM: Magnesium: 1.3 mg/dL — ABNORMAL LOW (ref 1.7–2.4)

## 2015-03-21 MED ORDER — ZOLEDRONIC ACID 4 MG/5ML IV CONC: 4.0000 mg | Freq: Once | INTRAVENOUS | Status: DC

## 2015-03-21 MED ORDER — SODIUM CHLORIDE 0.9 % IV SOLN
Freq: Once | INTRAVENOUS | Status: AC
  Administered 2015-03-21: 11:00:00 via INTRAVENOUS
  Filled 2015-03-21: qty 1000

## 2015-03-21 MED ORDER — SODIUM CHLORIDE 0.9 % IV SOLN
Freq: Once | INTRAVENOUS | Status: AC
  Filled 2015-03-21: qty 1000

## 2015-03-21 MED ORDER — ZOLEDRONIC ACID 4 MG/100ML IV SOLN
4.0000 mg | Freq: Once | INTRAVENOUS | Status: AC
  Administered 2015-03-21: 4 mg via INTRAVENOUS
  Filled 2015-03-21: qty 100

## 2015-03-21 MED ORDER — MAGNESIUM SULFATE 50 % IJ SOLN: 4.0000 g | Freq: Once | INTRAMUSCULAR | Status: DC

## 2015-03-21 MED ORDER — MAGNESIUM SULFATE 4 GM/100ML IV SOLN
4.0000 g | Freq: Once | INTRAVENOUS | Status: AC
  Administered 2015-03-21: 4 g via INTRAVENOUS
  Filled 2015-03-21: qty 100

## 2015-03-22 ENCOUNTER — Ambulatory Visit: Admission: RE | Admit: 2015-03-22 | Payer: PPO | Source: Ambulatory Visit

## 2015-03-22 ENCOUNTER — Ambulatory Visit: Payer: PPO

## 2015-03-23 ENCOUNTER — Ambulatory Visit: Payer: PPO | Admitting: Oncology

## 2015-03-23 ENCOUNTER — Inpatient Hospital Stay: Payer: PPO | Admitting: Oncology

## 2015-03-23 ENCOUNTER — Ambulatory Visit: Payer: PPO

## 2015-03-23 ENCOUNTER — Other Ambulatory Visit: Payer: Self-pay | Admitting: *Deleted

## 2015-03-23 DIAGNOSIS — C3491 Malignant neoplasm of unspecified part of right bronchus or lung: Secondary | ICD-10-CM

## 2015-03-24 ENCOUNTER — Encounter: Payer: Self-pay | Admitting: Oncology

## 2015-03-24 ENCOUNTER — Inpatient Hospital Stay (HOSPITAL_BASED_OUTPATIENT_CLINIC_OR_DEPARTMENT_OTHER): Payer: PPO | Admitting: Oncology

## 2015-03-24 ENCOUNTER — Inpatient Hospital Stay: Payer: PPO

## 2015-03-24 VITALS — BP 117/71 | HR 101 | Temp 97.8°F | Resp 18 | Wt 268.2 lb

## 2015-03-24 DIAGNOSIS — C3491 Malignant neoplasm of unspecified part of right bronchus or lung: Secondary | ICD-10-CM

## 2015-03-24 DIAGNOSIS — I10 Essential (primary) hypertension: Secondary | ICD-10-CM

## 2015-03-24 DIAGNOSIS — R0602 Shortness of breath: Secondary | ICD-10-CM

## 2015-03-24 DIAGNOSIS — B0229 Other postherpetic nervous system involvement: Secondary | ICD-10-CM

## 2015-03-24 DIAGNOSIS — Z794 Long term (current) use of insulin: Secondary | ICD-10-CM

## 2015-03-24 DIAGNOSIS — C7801 Secondary malignant neoplasm of right lung: Secondary | ICD-10-CM

## 2015-03-24 DIAGNOSIS — Z5111 Encounter for antineoplastic chemotherapy: Secondary | ICD-10-CM | POA: Diagnosis not present

## 2015-03-24 DIAGNOSIS — C779 Secondary and unspecified malignant neoplasm of lymph node, unspecified: Secondary | ICD-10-CM | POA: Diagnosis not present

## 2015-03-24 DIAGNOSIS — J449 Chronic obstructive pulmonary disease, unspecified: Secondary | ICD-10-CM

## 2015-03-24 DIAGNOSIS — Z87891 Personal history of nicotine dependence: Secondary | ICD-10-CM

## 2015-03-24 DIAGNOSIS — C3432 Malignant neoplasm of lower lobe, left bronchus or lung: Secondary | ICD-10-CM | POA: Diagnosis not present

## 2015-03-24 DIAGNOSIS — Z7984 Long term (current) use of oral hypoglycemic drugs: Secondary | ICD-10-CM

## 2015-03-24 DIAGNOSIS — Z923 Personal history of irradiation: Secondary | ICD-10-CM

## 2015-03-24 DIAGNOSIS — E669 Obesity, unspecified: Secondary | ICD-10-CM

## 2015-03-24 DIAGNOSIS — Z79899 Other long term (current) drug therapy: Secondary | ICD-10-CM

## 2015-03-24 DIAGNOSIS — F418 Other specified anxiety disorders: Secondary | ICD-10-CM

## 2015-03-24 DIAGNOSIS — Z801 Family history of malignant neoplasm of trachea, bronchus and lung: Secondary | ICD-10-CM

## 2015-03-24 DIAGNOSIS — Z800 Family history of malignant neoplasm of digestive organs: Secondary | ICD-10-CM

## 2015-03-24 DIAGNOSIS — E119 Type 2 diabetes mellitus without complications: Secondary | ICD-10-CM

## 2015-03-24 DIAGNOSIS — Z8673 Personal history of transient ischemic attack (TIA), and cerebral infarction without residual deficits: Secondary | ICD-10-CM

## 2015-03-24 LAB — COMPREHENSIVE METABOLIC PANEL
ALK PHOS: 65 U/L (ref 38–126)
ALT: 12 U/L — ABNORMAL LOW (ref 14–54)
ANION GAP: 8 (ref 5–15)
AST: 19 U/L (ref 15–41)
Albumin: 3.1 g/dL — ABNORMAL LOW (ref 3.5–5.0)
BILIRUBIN TOTAL: 0.5 mg/dL (ref 0.3–1.2)
BUN: 10 mg/dL (ref 6–20)
CALCIUM: 11.1 mg/dL — AB (ref 8.9–10.3)
CO2: 31 mmol/L (ref 22–32)
CREATININE: 0.52 mg/dL (ref 0.44–1.00)
Chloride: 97 mmol/L — ABNORMAL LOW (ref 101–111)
GFR calc non Af Amer: >60 mL/min (ref >60)
Glucose, Bld: 194 mg/dL — ABNORMAL HIGH (ref 65–99)
Potassium: 3.9 mmol/L (ref 3.5–5.1)
SODIUM: 136 mmol/L (ref 135–145)
TOTAL PROTEIN: 6.8 g/dL (ref 6.5–8.1)

## 2015-03-24 LAB — MAGNESIUM: MAGNESIUM: 1.4 mg/dL — AB (ref 1.7–2.4)

## 2015-03-24 MED ORDER — ZOLEDRONIC ACID 4 MG/5ML IV CONC: 2.0000 mg | Freq: Once | INTRAVENOUS | Status: DC

## 2015-03-24 MED ORDER — MAGNESIUM SULFATE 2 GM/50ML IV SOLN
2.0000 g | Freq: Once | INTRAVENOUS | Status: AC
Start: 2015-03-24 — End: 2015-03-24
  Administered 2015-03-24: 2 g via INTRAVENOUS
  Filled 2015-03-24: qty 50

## 2015-03-24 MED ORDER — IPRATROPIUM-ALBUTEROL 0.5-2.5 (3) MG/3ML IN SOLN
3.0000 mL | Freq: Once | RESPIRATORY_TRACT | Status: AC
  Administered 2015-03-24: 3 mL via RESPIRATORY_TRACT
  Filled 2015-03-24: qty 3

## 2015-03-24 NOTE — Progress Notes (Signed)
Patient states she fell in the past week.

## 2015-03-25 ENCOUNTER — Encounter: Payer: Self-pay | Admitting: Oncology

## 2015-03-25 DIAGNOSIS — C801 Malignant (primary) neoplasm, unspecified: Secondary | ICD-10-CM | POA: Insufficient documentation

## 2015-03-25 NOTE — Progress Notes (Addendum)
Sherry Mueller (MR# [id number])     [place] @ [facility] [phone]  Fax:([phone]) [place] OB: 1940/07/05  MR#: [id number]  HYI#:[id number]  Patient Care Team: Arnetha Courser, MD as PCP - General (Family Medicine) Arnetha Courser, MD (Family Medicine)  CHIEF COMPLAINT:  Chief Complaint  Patient presents with  . Lung Cancer    1.  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 Status post radiation therapy but patient did not have any significant amount of chemotherapy only one dose of carboplatinum has patient declined chemotherapy along with radiation treatment because of side effect of radiation therapy Diagnosis in July of 2016 2.  Progressive disease in the left lower lobe on recent CT scan of November 2 016 Patient is starting carboplatinum and Abraxane (November, 2016) 3. patient has finished 4 cycles of carboplatinum and Taxol in January of 2017 4.Repeat CT scan and PET scan shows progressive disease with possible new onset of bone metastases since spine 5.Hypercalcemia and progressive disease based on symptoms We will start TECENTRIQ  INTERVAL HISTORY:  75 year old lady with moderate obesity and insulin-dependent diabetes came today further follow-up has shortness of breath on exertion patient is using oxygen at home.   Patient is here for ongoing evaluation and treatment consideration.  This is the beginning of cycle 3 Patient had a repeat PET scan done which has been reviewed independently.  Patient has increasing cough and shortness of breath since last evaluation. Dear for further follow-up and treatment consideration   PATIENT'Scondition has been declining.  Had a fall recently.  Patient did not go for CT scan of the brain as well as abdomen last Friday.  Here for progressive shortness of breath.  Appetite is poor.  He was  treated with Zometa and IV fluid for hypercalcemia  REVIEW OF SYSTEMS:   Gen. Status:Moderately obese lady not in acute distress at the present time.  Postherpetic neuralgia is improving. Feeling weak and tired.  No chills.  No fever. GI: No nausea no vomiting no diarrhea. GU: No dysuria or hematuria Skin: Herpetic rash in the left gluteal area has been better.  But postherpetic neurology a continues to Lungs: Shortness of breath, gradually getting worse : No chest pain. Increasing cough.  No hemoptysis. Lower extremity no swelling neurological system no headache no dizziness As an continues to have postherpetic neuralgia in the low back area Neurological system: No headache.  No dizziness.  No other focal symptoms.  Patient has a history of fall    As per HPI. Otherwise, a complete review of systems is negatve.  PAST MEDICAL HISTORY: Past Medical History  Diagnosis Date  . Stroke ([place])   . Hyperlipidemia   . Cancer ([place])   . Hypertension   . COPD (chronic obstructive pulmonary disease) ([place])   . Shortness of breath dyspnea   . Diabetes mellitus without complication ([place])   . Depression   . Anxiety   . Headache   . Hypomagnesemia 03/17/2015    PAST SURGICAL HISTORY: Past Surgical History  Procedure Laterality Date  . Breast surgery Left cyst removed  . Cardiac catheterization    . Colonoscopy    . Endobronchial ultrasound Right 07/08/2014    Procedure: ENDOBRONCHIAL ULTRASOUND;  Surgeon: Vilinda Boehringer, MD;  Location: [facility] ORS;  Service: Cardiopulmonary;  Laterality: Right;    FAMILY HISTORY  Family History  Problem Relation Age of Onset  . Heart disease Mother   . Hypertension Mother   . Congestive Heart Failure Mother   . Heart disease Father   . Stroke Father   . Cancer Father     colon  . Cancer Maternal Uncle     lung  . Lung disease Maternal Grandmother   . Alcohol abuse Maternal Grandfather   . Cancer Paternal Grandmother   . Stroke Paternal Grandmother       ADVANCED DIRECTIVES:  No flowsheet data found.  HEALTH MAINTENANCE: Social History  Substance Use Topics  . Smoking status: Former Smoker -- 2.00 packs/day    Types: Cigarettes    Quit date: 06/14/2004  . Smokeless tobacco: Never Used  . Alcohol Use: No      Allergies  Allergen Reactions  . Byetta 10 Mcg Pen [Exenatide] Nausea Only  . Hctz [Hydrochlorothiazide] Nausea Only  . Penicillins Itching  . Plavix [Clopidogrel] Nausea Only  . Plavix [Clopidogrel] Nausea And Vomiting    Current Outpatient Prescriptions  Medication Sig Dispense Refill  . albuterol (PROVENTIL HFA;VENTOLIN HFA) 108 (90 Base) MCG/ACT inhaler Inhale 2 puffs into the lungs every 4 (four) hours as needed for wheezing or shortness of breath. 1 Inhaler 2  . amLODipine (NORVASC) 5 MG tablet TAKE 1 TABLET BY MOUTH EVERY DAY 30 tablet 11  . B Complex Vitamins (B COMPLEX PO) Take 1 tablet by mouth every morning.    . benazepril (LOTENSIN) 20 MG tablet Take 1 tablet (20 mg total) by mouth daily. 90 tablet 3  . busPIRone (BUSPAR) 10 MG tablet Take 10 mg by mouth 2 (two) times daily as needed.    . Cholecalciferol (VITAMIN D3) 2000 UNITS TABS Take 1 tablet by mouth every morning.    . citalopram (CELEXA) 40 MG tablet TAKE 1 TABLET BY MOUTH AT BEDTIME 30 tablet 2  . co-enzyme Q-10 30 MG capsule Take 30 mg by mouth daily.    . fluticasone (FLONASE) 50 MCG/ACT nasal spray Place 2 sprays into both nostrils daily. 15.8 g 11  . Fluticasone-Salmeterol (ADVAIR DISKUS) 250-50 MCG/DOSE AEPB Inhale 1 puff into the lungs as needed.    . gabapentin (NEURONTIN) 300 MG capsule Take 1 capsule (300 mg total) by mouth 2 (two) times daily. 60 capsule 0  . glimepiride (AMARYL) 4 MG tablet TAKE 1 TABLET BY MOUTH EVERY DAY 30 tablet 2  . glucose blood test strip OneTouch Ultra test strips; check glucose 3x a day; LON 99 months; E11.65 100 each 11  . glucose monitoring kit (FREESTYLE) monitoring kit 1 each by Does not apply route as  needed for other.    . guaiFENesin-codeine 100-10 MG/5ML syrup Take 5 mLs by mouth 3 (three) times daily as needed for cough. 120 mL 2  . HYDROcodone-acetaminophen (NORCO/VICODIN) 5-325 MG tablet Take 1 tablet by mouth every 6 (six) hours as needed for moderate pain. 30 tablet 0  . isosorbide mononitrate (IMDUR) 30 MG 24 hr tablet TAKE 1 TABLET BY MOUTH EVERY DAY 30 tablet 2  . lansoprazole (PREVACID) 30 MG capsule Take 1 capsule (30 mg total) by mouth daily at 12 noon. 30 capsule 2  . linagliptin (TRADJENTA) 5 MG TABS tablet Take 1 tablet (5 mg total) by mouth daily. 30 tablet 2  . MAG64 535 (64 Mg) MG TBCR TAKE 1 TABLET (64 MG TOTAL) BY MOUTH 2 (TWO) TIMES DAILY. 60 tablet 0  . metFORMIN (GLUCOPHAGE-XR) 500 MG 24 hr tablet Take  2 tablets (1,000 mg total) by mouth 2 (two) times daily. Take 2 tablets twice a day 360 tablet 1  . Multiple Vitamins-Minerals (MULTIVITAMIN PO) Take 1 tablet by mouth 2 (two) times daily.    . Omega-3 Fatty Acids (FISH OIL PO) Take 1,000 mg by mouth every morning.    . ondansetron (ZOFRAN) 4 MG tablet Take 1 tablet (4 mg total) by mouth every 6 (six) hours as needed for nausea or vomiting. 30 tablet 3  . pravastatin (PRAVACHOL) 40 MG tablet Take 1 tablet (40 mg total) by mouth at bedtime. 30 tablet 5  . valACYclovir (VALTREX) 1000 MG tablet      No current facility-administered medications for this visit.   Facility-Administered Medications Ordered in Other Visits  Medication Dose Route Frequency Provider Last Rate Last Dose  . 0.9 %  sodium chloride infusion   Intravenous Once Evlyn Kanner, NP        OBJECTIVE:  Filed Vitals:   03/24/15 1008  BP: 117/71  Pulse: 101  Temp: 97.8 F (36.6 C)  Resp: 18     Body mass index is 43.31 kg/(m^2).    ECOG FS:1 - Symptomatic but completely ambulatory  PHYSICAL EXAM: Gen. status: Moderately of obese lady in mild distress because of exertion.  Oxygen never dropped to 87% on exertion Lungs: Diminished air entry on  both sides.  No rhonchi or rales Cardiac: Tachycardia Lymphatic system: Supraclavicular, cervical, axillary, inguinal lymph nodes are not palpable Abdominal exam revealed normal bowel sounds. The abdomen was soft, non-tender, and without masses, organomegaly, or appreciable enlargement of the abdominal aorta. Examination of the skin revealed no evidence of significant rashes, suspicious appearing nevi or other concerning lesions. Neurologically, the patient was awake, alert, and oriented to person, place and time. There were no obvious focal neurologic abnormalities. Skin: Herpes zoster on the left lower back healing well   LAB RESULTS:  CBC Latest Ref Rng 03/21/2015 03/17/2015  WBC 3.6 - 11.0 K/uL 9.0 9.0  Hemoglobin 12.0 - 16.0 g/dL 11.7(L) 11.5(L)  Hematocrit 35.0 - 47.0 % 34.6(L) 33.8(L)  Platelets 150 - 440 K/uL 310 278    Appointment on 03/24/2015  Component Date Value Ref Range Status  . Sodium 03/24/2015 136  135 - 145 mmol/L Final  . Potassium 03/24/2015 3.9  3.5 - 5.1 mmol/L Final  . Chloride 03/24/2015 97* 101 - 111 mmol/L Final  . CO2 03/24/2015 31  22 - 32 mmol/L Final  . Glucose, Bld 03/24/2015 194* 65 - 99 mg/dL Final  . BUN 03/24/2015 10  6 - 20 mg/dL Final  . Creatinine, Ser 03/24/2015 0.52  0.44 - 1.00 mg/dL Final  . Calcium 03/24/2015 11.1* 8.9 - 10.3 mg/dL Final  . Total Protein 03/24/2015 6.8  6.5 - 8.1 g/dL Final  . Albumin 03/24/2015 3.1* 3.5 - 5.0 g/dL Final  . AST 03/24/2015 19  15 - 41 U/L Final  . ALT 03/24/2015 12* 14 - 54 U/L Final  . Alkaline Phosphatase 03/24/2015 65  38 - 126 U/L Final  . Total Bilirubin 03/24/2015 0.5  0.3 - 1.2 mg/dL Final  . GFR calc non Af Amer 03/24/2015 >60  >60 mL/min Final  . GFR calc Af Amer 03/24/2015 >60  >60 mL/min Final   Comment: (NOTE) The eGFR has been calculated using the CKD EPI equation. This calculation has not been validated in all clinical situations. eGFR's persistently <60 mL/min signify possible Chronic  Kidney Disease.   . Anion gap 03/24/2015 8  5 - 15 Final  . Magnesium 03/24/2015 1.4* 1.7 - 2.4 mg/dL Final  Appointment on 03/21/2015  Component Date Value Ref Range Status  . WBC 03/21/2015 9.0  3.6 - 11.0 K/uL Final  . RBC 03/21/2015 3.88  3.80 - 5.20 MIL/uL Final  . Hemoglobin 03/21/2015 11.7* 12.0 - 16.0 g/dL Final  . HCT 03/21/2015 34.6* 35.0 - 47.0 % Final  . MCV 03/21/2015 89.2  80.0 - 100.0 fL Final  . MCH 03/21/2015 30.2  26.0 - 34.0 pg Final  . MCHC 03/21/2015 33.8  32.0 - 36.0 g/dL Final  . RDW 03/21/2015 15.6* 11.5 - 14.5 % Final  . Platelets 03/21/2015 310  150 - 440 K/uL Final  . Neutrophils Relative % 03/21/2015 86   Final  . Neutro Abs 03/21/2015 7.7* 1.4 - 6.5 K/uL Final  . Lymphocytes Relative 03/21/2015 4   Final  . Lymphs Abs 03/21/2015 0.4* 1.0 - 3.6 K/uL Final  . Monocytes Relative 03/21/2015 8   Final  . Monocytes Absolute 03/21/2015 0.7  0.2 - 0.9 K/uL Final  . Eosinophils Relative 03/21/2015 1   Final  . Eosinophils Absolute 03/21/2015 0.1  0 - 0.7 K/uL Final  . Basophils Relative 03/21/2015 1   Final  . Basophils Absolute 03/21/2015 0.1  0 - 0.1 K/uL Final  . Sodium 03/21/2015 135  135 - 145 mmol/L Final  . Potassium 03/21/2015 3.8  3.5 - 5.1 mmol/L Final  . Chloride 03/21/2015 97* 101 - 111 mmol/L Final  . CO2 03/21/2015 32  22 - 32 mmol/L Final  . Glucose, Bld 03/21/2015 115* 65 - 99 mg/dL Final  . BUN 03/21/2015 10  6 - 20 mg/dL Final  . Creatinine, Ser 03/21/2015 0.54  0.44 - 1.00 mg/dL Final  . Calcium 03/21/2015 12.1* 8.9 - 10.3 mg/dL Final  . Total Protein 03/21/2015 7.1  6.5 - 8.1 g/dL Final  . Albumin 03/21/2015 3.3* 3.5 - 5.0 g/dL Final  . AST 03/21/2015 18  15 - 41 U/L Final  . ALT 03/21/2015 10* 14 - 54 U/L Final  . Alkaline Phosphatase 03/21/2015 63  38 - 126 U/L Final  . Total Bilirubin 03/21/2015 0.8  0.3 - 1.2 mg/dL Final  . GFR calc non Af Amer 03/21/2015 >60  >60 mL/min Final  . GFR calc Af Amer 03/21/2015 >60  >60 mL/min Final    Comment: (NOTE) The eGFR has been calculated using the CKD EPI equation. This calculation has not been validated in all clinical situations. eGFR's persistently <60 mL/min signify possible Chronic Kidney Disease.   . Anion gap 03/21/2015 6  5 - 15 Final  . Magnesium 03/21/2015 1.3* 1.7 - 2.4 mg/dL Final       ASSESSMENT: Squamous cell carcinoma of lung right lower lobe positive mediastinal lymph node.  4 x 5 cm left lower lobe mass . supraclavicular lymph node Stage IV disease Postherpetic neuralgia Gabapentin and Vicodin is controlling pain will continue that. Herpetic neuralgia is improving   MEDICAL DECISION MAKING:  All lab data has been reviewed Based on clinical symptoms of hypercalcemia which is new and progressive shortness of breath patient has a progressive disease.  Was initially considered for the study but patient was reluctant to participate in study.  Proceed with CT scan of the chest and because of fall we would like to get a CT scan of brain done. Even though patient was offered that to be done today patient's son cannot take her far all the x-rays because of  his condition.  At this point in time I discuss possibility of anti-PDL drug(TECENTRIQ) I doubt further chemotherapy can be useful as patient ,AS PATIENT HAD   carboplatin Cline Crock in January   IN January    AND  now clear-cut evidence of progressive disease.  If there is no metastases to the brain then patient can start treatment with a centric. If there is metastases to the brain then radiation therapy to the brain would be recommended Hypercalcemia: Calcium is dropped but still is 11.1 cm to milligram of Zometa would be given intravenously Patient does not want steroid which in turn can help hypercalcemia but she is extremely against using steroid Hypomagnesemia Will proceed with intravenous magnesium In my absence patient will be evaluated by my associate Plan is to proceed with anti-PDL  (tecentriq     0 drug unless metastases to the brain is found.  In each case patient can be referred to radiation oncologist  I had prolonged discussion with patient and family about progressing disease.  ,condition and prognosis. Total duration of visit was 5mnutes.  50% or more time was spent in counseling patient and family regarding prognosis and options of treatment and available resources Need for patient to use oxygen all the time.  Portable concentrator for oxygen has been requested from the company  Shortness of breath will proceed with oneDUONAB THERAPY  As patient is not going on protocol echocardiogram and EKG has been canceled  . JForest Gleason MD   03/25/2015 7:43 AM                                     LYates Decamp(MR# [id number]

## 2015-03-29 ENCOUNTER — Ambulatory Visit
Admission: RE | Admit: 2015-03-29 | Discharge: 2015-03-29 | Disposition: A | Discharge status: Home / Self Care | Payer: PPO | Source: Ambulatory Visit | Attending: Oncology | Admitting: Oncology

## 2015-03-29 ENCOUNTER — Other Ambulatory Visit: Payer: PPO

## 2015-03-29 DIAGNOSIS — C3491 Malignant neoplasm of unspecified part of right bronchus or lung: Secondary | ICD-10-CM | POA: Insufficient documentation

## 2015-03-29 DIAGNOSIS — J90 Pleural effusion, not elsewhere classified: Secondary | ICD-10-CM | POA: Diagnosis not present

## 2015-03-29 MED ORDER — IOPAMIDOL (ISOVUE-300) INJECTION 61%: 100.0000 mL | Freq: Once | INTRAVENOUS | Status: DC | PRN

## 2015-03-30 ENCOUNTER — Ambulatory Visit: Payer: PPO | Admitting: Family Medicine

## 2015-03-30 ENCOUNTER — Other Ambulatory Visit: Payer: PPO

## 2015-03-30 ENCOUNTER — Ambulatory Visit: Payer: PPO

## 2015-03-31 ENCOUNTER — Other Ambulatory Visit: Payer: PPO

## 2015-03-31 ENCOUNTER — Ambulatory Visit: Payer: PPO

## 2015-03-31 ENCOUNTER — Ambulatory Visit: Payer: PPO | Admitting: Oncology

## 2015-03-31 ENCOUNTER — Ambulatory Visit: Payer: PPO | Admitting: Family Medicine

## 2015-04-01 ENCOUNTER — Inpatient Hospital Stay: Payer: PPO

## 2015-04-01 ENCOUNTER — Inpatient Hospital Stay (HOSPITAL_BASED_OUTPATIENT_CLINIC_OR_DEPARTMENT_OTHER): Payer: PPO | Admitting: Oncology

## 2015-04-01 VITALS — BP 109/60 | HR 99 | Resp 20

## 2015-04-01 DIAGNOSIS — E119 Type 2 diabetes mellitus without complications: Secondary | ICD-10-CM

## 2015-04-01 DIAGNOSIS — C779 Secondary and unspecified malignant neoplasm of lymph node, unspecified: Secondary | ICD-10-CM

## 2015-04-01 DIAGNOSIS — J449 Chronic obstructive pulmonary disease, unspecified: Secondary | ICD-10-CM

## 2015-04-01 DIAGNOSIS — C3490 Malignant neoplasm of unspecified part of unspecified bronchus or lung: Secondary | ICD-10-CM

## 2015-04-01 DIAGNOSIS — I10 Essential (primary) hypertension: Secondary | ICD-10-CM

## 2015-04-01 DIAGNOSIS — Z923 Personal history of irradiation: Secondary | ICD-10-CM

## 2015-04-01 DIAGNOSIS — R350 Frequency of micturition: Secondary | ICD-10-CM

## 2015-04-01 DIAGNOSIS — M4316 Spondylolisthesis, lumbar region: Secondary | ICD-10-CM

## 2015-04-01 DIAGNOSIS — J90 Pleural effusion, not elsewhere classified: Secondary | ICD-10-CM

## 2015-04-01 DIAGNOSIS — C7951 Secondary malignant neoplasm of bone: Secondary | ICD-10-CM | POA: Diagnosis not present

## 2015-04-01 DIAGNOSIS — Z8673 Personal history of transient ischemic attack (TIA), and cerebral infarction without residual deficits: Secondary | ICD-10-CM

## 2015-04-01 DIAGNOSIS — Z7984 Long term (current) use of oral hypoglycemic drugs: Secondary | ICD-10-CM

## 2015-04-01 DIAGNOSIS — C3491 Malignant neoplasm of unspecified part of right bronchus or lung: Secondary | ICD-10-CM

## 2015-04-01 DIAGNOSIS — Z800 Family history of malignant neoplasm of digestive organs: Secondary | ICD-10-CM

## 2015-04-01 DIAGNOSIS — C3492 Malignant neoplasm of unspecified part of left bronchus or lung: Secondary | ICD-10-CM

## 2015-04-01 DIAGNOSIS — C7801 Secondary malignant neoplasm of right lung: Secondary | ICD-10-CM | POA: Diagnosis not present

## 2015-04-01 DIAGNOSIS — R160 Hepatomegaly, not elsewhere classified: Secondary | ICD-10-CM

## 2015-04-01 DIAGNOSIS — R0602 Shortness of breath: Secondary | ICD-10-CM

## 2015-04-01 DIAGNOSIS — Z5111 Encounter for antineoplastic chemotherapy: Secondary | ICD-10-CM | POA: Diagnosis not present

## 2015-04-01 DIAGNOSIS — Z801 Family history of malignant neoplasm of trachea, bronchus and lung: Secondary | ICD-10-CM

## 2015-04-01 DIAGNOSIS — E669 Obesity, unspecified: Secondary | ICD-10-CM

## 2015-04-01 DIAGNOSIS — C3432 Malignant neoplasm of lower lobe, left bronchus or lung: Secondary | ICD-10-CM | POA: Diagnosis not present

## 2015-04-01 DIAGNOSIS — R32 Unspecified urinary incontinence: Secondary | ICD-10-CM

## 2015-04-01 DIAGNOSIS — E785 Hyperlipidemia, unspecified: Secondary | ICD-10-CM

## 2015-04-01 DIAGNOSIS — R5383 Other fatigue: Secondary | ICD-10-CM

## 2015-04-01 DIAGNOSIS — R531 Weakness: Secondary | ICD-10-CM

## 2015-04-01 DIAGNOSIS — M47816 Spondylosis without myelopathy or radiculopathy, lumbar region: Secondary | ICD-10-CM

## 2015-04-01 DIAGNOSIS — B0229 Other postherpetic nervous system involvement: Secondary | ICD-10-CM

## 2015-04-01 DIAGNOSIS — C801 Malignant (primary) neoplasm, unspecified: Secondary | ICD-10-CM

## 2015-04-01 DIAGNOSIS — Z794 Long term (current) use of insulin: Secondary | ICD-10-CM

## 2015-04-01 DIAGNOSIS — Z87891 Personal history of nicotine dependence: Secondary | ICD-10-CM

## 2015-04-01 DIAGNOSIS — F418 Other specified anxiety disorders: Secondary | ICD-10-CM

## 2015-04-01 DIAGNOSIS — R918 Other nonspecific abnormal finding of lung field: Secondary | ICD-10-CM

## 2015-04-01 DIAGNOSIS — Z79899 Other long term (current) drug therapy: Secondary | ICD-10-CM

## 2015-04-01 DIAGNOSIS — I25810 Atherosclerosis of coronary artery bypass graft(s) without angina pectoris: Secondary | ICD-10-CM

## 2015-04-01 LAB — CBC WITH DIFFERENTIAL/PLATELET
BASOS PCT: 1 %
Basophils Absolute: 0.1 10*3/uL (ref 0–0.1)
Eosinophils Absolute: 0.1 10*3/uL (ref 0–0.7)
Eosinophils Relative: 1 %
HCT: 33.3 % — ABNORMAL LOW (ref 35.0–47.0)
HEMOGLOBIN: 10.8 g/dL — AB (ref 12.0–16.0)
LYMPHS ABS: 0.5 10*3/uL — AB (ref 1.0–3.6)
LYMPHS PCT: 5 %
MCH: 28.8 pg (ref 26.0–34.0)
MCHC: 32.5 g/dL (ref 32.0–36.0)
MCV: 88.8 fL (ref 80.0–100.0)
MONO ABS: 1 10*3/uL — AB (ref 0.2–0.9)
Monocytes Relative: 10 %
NEUTROS ABS: 8.1 10*3/uL — AB (ref 1.4–6.5)
Neutrophils Relative %: 83 %
PLATELETS: 325 10*3/uL (ref 150–440)
RBC: 3.76 MIL/uL — ABNORMAL LOW (ref 3.80–5.20)
RDW: 15.8 % — ABNORMAL HIGH (ref 11.5–14.5)
WBC: 9.7 10*3/uL (ref 3.6–11.0)

## 2015-04-01 LAB — MAGNESIUM: Magnesium: 1.7 mg/dL (ref 1.7–2.4)

## 2015-04-01 LAB — CALCIUM: CALCIUM: 10.8 mg/dL — AB (ref 8.9–10.3)

## 2015-04-01 MED ORDER — SODIUM CHLORIDE 0.9 % IV SOLN
Freq: Once | INTRAVENOUS | Status: AC
  Administered 2015-04-01: 10:00:00 via INTRAVENOUS
  Filled 2015-04-01: qty 1000

## 2015-04-01 MED ORDER — SODIUM CHLORIDE 0.9 % IV SOLN
1200.0000 mg | Freq: Once | INTRAVENOUS | Status: AC
  Administered 2015-04-01: 1200 mg via INTRAVENOUS
  Filled 2015-04-01: qty 20

## 2015-04-01 NOTE — Progress Notes (Signed)
Pt receiving her Tecentrique. Assisted to [facility] with much difficulty. Required  2 RN's to assist. Pt had a near miss and almost fell.pivoting from commode  back to W/C. Pt is morbidly obese, very weak and dyspneic.

## 2015-04-01 NOTE — Progress Notes (Signed)
Patient has an increase in her weakness and frequent urination.  During assessment in exam room patient started coughing with difficulty breathing so I took her to the infusion suite to receive O2.  Her son states that she is using oxygen at home but mainly during the night and would like for her to receive portable oxygen for use when traveling to MD appointments.  When transported to infusion via wheelchair her O2 on room air was 64% that dropped to 59% on ambulation from wheelchair to infusion chair.  She was then put on oxygen at 2 lpm and her O2 improved to 88%

## 2015-04-04 ENCOUNTER — Telehealth: Payer: Self-pay

## 2015-04-04 NOTE — Telephone Encounter (Signed)
They were calling to inform us that when they tried to deliver the portable oxygen tank the family yelled profanity at the delivery driver and to get off their property.  When they sent another driver out the next day the same thing happened.  [employer] is not going out again until we talk with the family.    At patient's appt her son was requesting a portable concentrator and not a cylinder.  [employer] policy is to start with a cylinder and they do not keep the concentrators on site.  To get an oxygen concentrator they have to have titrations, because it does not provide continuous oxygen, then the machine will have to be ordered and could take 6-12 weeks for delivery.    The portable oxygen concentrator weighs 5-10 lbs (not continuous flow and requires titration), oxygen cylinder 8-9 lbs and provided a cart (continuous flow), also has a smaller oxygen cylinder that weighs 3.5 lbs (not continuous flow and requires titration).

## 2015-04-06 ENCOUNTER — Other Ambulatory Visit: Payer: Self-pay | Admitting: Family Medicine

## 2015-04-06 MED ORDER — PRAVASTATIN SODIUM 40 MG PO TABS: 40.0000 mg | ORAL_TABLET | Freq: Every day | ORAL | Status: DC

## 2015-04-06 NOTE — Telephone Encounter (Signed)
Should not need statin; pharmacy requested higher dose; declined; metformin approved after check last cmp

## 2015-04-07 ENCOUNTER — Ambulatory Visit: Payer: PPO | Admitting: Family Medicine

## 2015-04-08 ENCOUNTER — Ambulatory Visit: Payer: PPO | Admitting: Oncology

## 2015-04-08 ENCOUNTER — Other Ambulatory Visit: Payer: PPO

## 2015-04-08 NOTE — Telephone Encounter (Signed)
Left message to return call to discuss portable O2.

## 2015-04-09 NOTE — Progress Notes (Signed)
[place]  [phone] [phone]  ID: CHRISSI CROW OB: [birth year]  MR#: [id number]  VOZ#:[id number]  Patient Care Team: Arnetha Courser, MD as PCP - General (Family Medicine) Arnetha Courser, MD (Family Medicine)  CHIEF COMPLAINT:  Chief Complaint  Patient presents with  . Lung Cancer    INTERVAL HISTORY: Patient is a 75 year old female with a diagnosis of stage IV squamous cell carcinoma of the lung initially diagnosed in July 2016. She completed XRT, but did not have a significant amount of concurrent chemotherapy (one infusion of carboplatinum) because of side effects. She was noted to have progressive disease in November 2016 and was subsequently started on carboplatin and Abraxane. She completed 3 cycles of treatment on January 19, 2015. Patient was again noted to have progressive disease with new onset bone metastasis and hypercalcemia. She returns to clinic today to initiate palliative immunotherapy using Tecentriq. She has increasing weakness and fatigue. She has difficulty breathing and was found to have an oxygen saturation of 59% on ambulation. She also complained of frequent urination. Her son states she does use oxygen at home, but does not carry it with her. She has no neurologic complaints. She denies any fevers. She has a poor appetite and denies any nausea, vomiting, constipation, or diarrhea. Patient offers no further specific complaints.  REVIEW OF SYSTEMS:   Review of Systems  Constitutional: Positive for malaise/fatigue. Negative for fever and weight loss.  Respiratory: Positive for cough and shortness of breath.   Cardiovascular: Negative.  Negative for chest pain.  Gastrointestinal: Positive for nausea. Negative for diarrhea.  Genitourinary: Positive for frequency.  Musculoskeletal: Negative.   Neurological: Positive for weakness.    As per HPI. Otherwise, a complete review of systems is negatve.  PAST MEDICAL  HISTORY: Past Medical History  Diagnosis Date  . Stroke ([place])   . Hyperlipidemia   . Cancer ([place])   . Hypertension   . COPD (chronic obstructive pulmonary disease) ([place])   . Shortness of breath dyspnea   . Diabetes mellitus without complication ([place])   . Depression   . Anxiety   . Headache   . Hypomagnesemia 03/17/2015    PAST SURGICAL HISTORY: Past Surgical History  Procedure Laterality Date  . Breast surgery Left cyst removed  . Cardiac catheterization    . Colonoscopy    . Endobronchial ultrasound Right 07/08/2014    Procedure: ENDOBRONCHIAL ULTRASOUND;  Surgeon: Vilinda Boehringer, MD;  Location: [facility] ORS;  Service: Cardiopulmonary;  Laterality: Right;    FAMILY HISTORY Family History  Problem Relation Age of Onset  . Heart disease Mother   . Hypertension Mother   . Congestive Heart Failure Mother   . Heart disease Father   . Stroke Father   . Cancer Father     colon  . Cancer Maternal Uncle     lung  . Lung disease Maternal Grandmother   . Alcohol abuse Maternal Grandfather   . Cancer Paternal Grandmother   . Stroke Paternal Grandmother        ADVANCED DIRECTIVES:    HEALTH MAINTENANCE: Social History  Substance Use Topics  . Smoking status: Former Smoker -- 2.00 packs/day    Types: Cigarettes    Quit date: 06/14/2004  . Smokeless tobacco: Never Used  . Alcohol Use: No     Colonoscopy:  PAP:  Bone density:  Lipid panel:  Allergies  Allergen Reactions  . Byetta 10 Mcg Pen [Exenatide] Nausea Only  . Hctz [Hydrochlorothiazide] Nausea Only  .  Penicillins Itching  . Plavix [Clopidogrel] Nausea Only  . Plavix [Clopidogrel] Nausea And Vomiting    Current Outpatient Prescriptions  Medication Sig Dispense Refill  . albuterol (PROVENTIL HFA;VENTOLIN HFA) 108 (90 Base) MCG/ACT inhaler Inhale 2 puffs into the lungs every 4 (four) hours as needed for wheezing or shortness of breath. 1 Inhaler 2  . amLODipine (NORVASC) 5 MG tablet TAKE 1 TABLET BY MOUTH EVERY  DAY 30 tablet 11  . B Complex Vitamins (B COMPLEX PO) Take 1 tablet by mouth every morning.    . benazepril (LOTENSIN) 20 MG tablet Take 1 tablet (20 mg total) by mouth daily. 90 tablet 3  . busPIRone (BUSPAR) 10 MG tablet Take 10 mg by mouth 2 (two) times daily as needed.    . Cholecalciferol (VITAMIN D3) 2000 UNITS TABS Take 1 tablet by mouth every morning.    . citalopram (CELEXA) 40 MG tablet TAKE 1 TABLET BY MOUTH AT BEDTIME 30 tablet 2  . co-enzyme Q-10 30 MG capsule Take 30 mg by mouth daily.    . fluticasone (FLONASE) 50 MCG/ACT nasal spray Place 2 sprays into both nostrils daily. 15.8 g 11  . Fluticasone-Salmeterol (ADVAIR DISKUS) 250-50 MCG/DOSE AEPB Inhale 1 puff into the lungs as needed.    . gabapentin (NEURONTIN) 300 MG capsule Take 1 capsule (300 mg total) by mouth 2 (two) times daily. 60 capsule 0  . glimepiride (AMARYL) 4 MG tablet TAKE 1 TABLET BY MOUTH EVERY DAY 30 tablet 2  . glucose blood test strip OneTouch Ultra test strips; check glucose 3x a day; LON 99 months; E11.65 100 each 11  . glucose monitoring kit (FREESTYLE) monitoring kit 1 each by Does not apply route as needed for other.    . guaiFENesin-codeine 100-10 MG/5ML syrup Take 5 mLs by mouth 3 (three) times daily as needed for cough. 120 mL 2  . HYDROcodone-acetaminophen (NORCO/VICODIN) 5-325 MG tablet Take 1 tablet by mouth every 6 (six) hours as needed for moderate pain. 30 tablet 0  . isosorbide mononitrate (IMDUR) 30 MG 24 hr tablet TAKE 1 TABLET BY MOUTH EVERY DAY 30 tablet 2  . lansoprazole (PREVACID) 30 MG capsule Take 1 capsule (30 mg total) by mouth daily at 12 noon. 30 capsule 2  . linagliptin (TRADJENTA) 5 MG TABS tablet Take 1 tablet (5 mg total) by mouth daily. 30 tablet 2  . MAG64 535 (64 Mg) MG TBCR TAKE 1 TABLET (64 MG TOTAL) BY MOUTH 2 (TWO) TIMES DAILY. 60 tablet 0  . metFORMIN (GLUCOPHAGE-XR) 500 MG 24 hr tablet TAKE 2 TABLETS BY MOUTH TWICE A DAY 360 tablet 0  . Multiple Vitamins-Minerals  (MULTIVITAMIN PO) Take 1 tablet by mouth 2 (two) times daily.    . Omega-3 Fatty Acids (FISH OIL PO) Take 1,000 mg by mouth every morning.    . ondansetron (ZOFRAN) 4 MG tablet Take 1 tablet (4 mg total) by mouth every 6 (six) hours as needed for nausea or vomiting. 30 tablet 3  . pravastatin (PRAVACHOL) 40 MG tablet Take 1 tablet (40 mg total) by mouth at bedtime. 30 tablet 2  . valACYclovir (VALTREX) 1000 MG tablet      No current facility-administered medications for this visit.   Facility-Administered Medications Ordered in Other Visits  Medication Dose Route Frequency Provider Last Rate Last Dose  . 0.9 %  sodium chloride infusion   Intravenous Once Evlyn Kanner, NP        OBJECTIVE: There were no vitals filed for  this visit.   There is no weight on file to calculate BMI.    ECOG FS:2 - Symptomatic, <50% confined to bed  General: Ill-appearing, no acute distress. Eyes: Pink conjunctiva, anicteric sclera. HEENT: Normocephalic, moist mucous membranes, clear oropharnyx. Lungs: Clear to auscultation bilaterally. Heart: Regular rate and rhythm. No rubs, murmurs, or gallops. Abdomen: Soft, nontender, nondistended. No organomegaly noted, normoactive bowel sounds. Musculoskeletal: No edema, cyanosis, or clubbing. Neuro: Alert, answering all questions appropriately. Cranial nerves grossly intact. Skin: No rashes or petechiae noted. Psych: Normal affect.   LAB RESULTS:  Lab Results  Component Value Date   NA 136 03/24/2015   K 3.9 03/24/2015   CL 97* 03/24/2015   CO2 31 03/24/2015   GLUCOSE 194* 03/24/2015   BUN 10 03/24/2015   CREATININE 0.52 03/24/2015   CALCIUM 10.8* 04/01/2015   PROT 6.8 03/24/2015   ALBUMIN 3.1* 03/24/2015   AST 19 03/24/2015   ALT 12* 03/24/2015   ALKPHOS 65 03/24/2015   BILITOT 0.5 03/24/2015   GFRNONAA >60 03/24/2015   GFRAA >60 03/24/2015    Lab Results  Component Value Date   WBC 9.7 04/01/2015   NEUTROABS 8.1* 04/01/2015   HGB 10.8*  04/01/2015   HCT 33.3* 04/01/2015   MCV 88.8 04/01/2015   PLT 325 04/01/2015     STUDIES: Ct Head Wo Contrast  03/29/2015  CLINICAL DATA:  Stage IV squamous cell lung carcinoma of the right lower lobe. Generalized weakness. EXAM: CT HEAD WITHOUT CONTRAST TECHNIQUE: Contiguous axial images were obtained from the base of the skull through the vertex without intravenous contrast. COMPARISON:  07/21/2014 head CT FINDINGS: There is no evidence of acute cortical infarct, intracranial hemorrhage, mass, midline shift, or extra-axial fluid collection. Mild generalized cerebral atrophy is unchanged. A chronic lacunar infarct is again seen in the left thalamus. Periventricular white-matter hypodensities are similar to the prior study and nonspecific but compatible with mild chronic small vessel ischemic disease. Orbits are unremarkable. The visualized paranasal sinuses and mastoid air cells are clear. No suspicious skull lesion is identified. Calcified atherosclerosis is noted at the skullbase. IMPRESSION: 1. No evidence of acute intracranial abnormality. 2. Mild chronic small vessel ischemic disease. Electronically Signed   By: Logan Bores M.D.   On: 03/29/2015 12:10   Ct Chest W Contrast  03/29/2015  CLINICAL DATA:  Squamous cell carcinoma of right lower lobe, stage IV. Status post chemotherapy. Short of breath, weakness. EXAM: CT CHEST, ABDOMEN WITH CONTRAST TECHNIQUE: Multidetector CT imaging of the chest, abdomen was performed following the standard protocol during bolus administration of intravenous contrast. CONTRAST:  100 cc of Isovue-300 COMPARISON:  PET of 01/31/2015.  Chest CT of 11/10/2014. FINDINGS: CT CHEST FINDINGS Mediastinum/Lymph Nodes: Right low jugular/ supraclavicular node measures 1.5 cm on image 1/series 2 and is similar to on the prior PET, but incompletely imaged. Tortuous descending thoracic aorta. Normal heart size with minimal anterior pericardial fluid, new. Aortic and branch vessel  atherosclerosis. No central pulmonary embolism, on this non-dedicated study. Left paratracheal node measures 1.4 cm on image 20/ series 2, similar. New mild subcarinal adenopathy at 1.4 cm on image 26/series 2. Persistent, likely increased left infrahilar adenopathy. This difficult to differentiate from the adjacent left lower lobe lung mass. Example image 27/ series 2. Prevascular node measures 1.4 cm on image 20/series 2 and is newly enlarged since the prior. Lungs/Pleura: Small left pleural effusion is new. Mass-effect upon left greater than right segmental lower lobe bronchi secondary to lower lobe lung lesions.  Centrilobular emphysema is moderate. 4 mm right lower lobe pulmonary nodule on image 33/series 4 is likely similar to on the prior PET. Central right lower lobe lung mass measures 3.3 x 3.0 cm on image 26/series 4. Compare 2.6 x 1.7 cm on the prior PET. Significant enlargement of left lower lobe lung mass. Measures on the order of 7.9 x 6.6 cm on image 33/series 4. Compare 5.7 x 3.8 cm on the prior PET. Surrounding patchy airspace and ground-glass opacity could represent a component of postobstructive pneumonitis versus less likely lymphangitic tumor spread. Musculoskeletal: No acute osseous abnormality. Similar tiny sclerotic lesion within the T11 vertebral body, likely a bone island. CT ABDOMEN  FINDINGS Hepatobiliary: Mild hepatomegaly, 18.9 cm craniocaudal. Normal gallbladder, without biliary ductal dilatation. Pancreas: Normal, without mass or ductal dilatation. Spleen: Normal in size, without focal abnormality. Adrenals/Urinary Tract: Normal left adrenal gland. 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 Normal left kidney, without hydronephrosis. There may be a too small to characterize lesion in the lower pole right kidney. Stomach/Bowel: Normal stomach, without wall thickening. Scattered colonic diverticula. Normal abdominal small bowel.  Vascular/Lymphatic: Abdominal aortic and branch vessel atherosclerosis. No retroperitoneal or retrocrural adenopathy. Other: No ascites. Musculoskeletal: Spondylosis throughout the lumbar spine and with L4-5 anterolisthesis, likely degenerative. Similar probable bone island within the superior left side of the L4 vertebral body. IMPRESSION: 1. Significant enlargement of left larger than right pulmonary masses. 2. Progressive thoracic adenopathy, consistent with nodal metastasis. Right lower jugular adenopathy is not significantly changed since the PET of 01/31/2015. 3. New small left pleural effusion. Left base airspace and ground-glass opacity could represent a component of postobstructive pneumonitis or less likely lymphangitic tumor spread. This is new since the prior PET. 4. No evidence of metastatic disease within the abdomen. Electronically Signed   By: Abigail Miyamoto M.D.   On: 03/29/2015 13:55   Ct Abdomen W Contrast  03/29/2015  CLINICAL DATA:  Squamous cell carcinoma of right lower lobe, stage IV. Status post chemotherapy. Short of breath, weakness. EXAM: CT CHEST, ABDOMEN WITH CONTRAST TECHNIQUE: Multidetector CT imaging of the chest, abdomen was performed following the standard protocol during bolus administration of intravenous contrast. CONTRAST:  100 cc of Isovue-300 COMPARISON:  PET of 01/31/2015.  Chest CT of 11/10/2014. FINDINGS: CT CHEST FINDINGS Mediastinum/Lymph Nodes: Right low jugular/ supraclavicular node measures 1.5 cm on image 1/series 2 and is similar to on the prior PET, but incompletely imaged. Tortuous descending thoracic aorta. Normal heart size with minimal anterior pericardial fluid, new. Aortic and branch vessel atherosclerosis. No central pulmonary embolism, on this non-dedicated study. Left paratracheal node measures 1.4 cm on image 20/ series 2, similar. New mild subcarinal adenopathy at 1.4 cm on image 26/series 2. Persistent, likely increased left infrahilar adenopathy. This  difficult to differentiate from the adjacent left lower lobe lung mass. Example image 27/ series 2. Prevascular node measures 1.4 cm on image 20/series 2 and is newly enlarged since the prior. Lungs/Pleura: Small left pleural effusion is new. Mass-effect upon left greater than right segmental lower lobe bronchi secondary to lower lobe lung lesions. Centrilobular emphysema is moderate. 4 mm right lower lobe pulmonary nodule on image 33/series 4 is likely similar to on the prior PET. Central right lower lobe lung mass measures 3.3 x 3.0 cm on image 26/series 4. Compare 2.6 x 1.7 cm on the prior PET. Significant enlargement of left lower lobe lung mass. Measures on the order of 7.9 x 6.6 cm  on image 33/series 4. Compare 5.7 x 3.8 cm on the prior PET. Surrounding patchy airspace and ground-glass opacity could represent a component of postobstructive pneumonitis versus less likely lymphangitic tumor spread. Musculoskeletal: No acute osseous abnormality. Similar tiny sclerotic lesion within the T11 vertebral body, likely a bone island. CT ABDOMEN  FINDINGS Hepatobiliary: Mild hepatomegaly, 18.9 cm craniocaudal. Normal gallbladder, without biliary ductal dilatation. Pancreas: Normal, without mass or ductal dilatation. Spleen: Normal in size, without focal abnormality. Adrenals/Urinary Tract: Normal left adrenal gland. 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 Normal left kidney, without hydronephrosis. There may be a too small to characterize lesion in the lower pole right kidney. Stomach/Bowel: Normal stomach, without wall thickening. Scattered colonic diverticula. Normal abdominal small bowel. Vascular/Lymphatic: Abdominal aortic and branch vessel atherosclerosis. No retroperitoneal or retrocrural adenopathy. Other: No ascites. Musculoskeletal: Spondylosis throughout the lumbar spine and with L4-5 anterolisthesis, likely degenerative. Similar probable bone island within the  superior left side of the L4 vertebral body. IMPRESSION: 1. Significant enlargement of left larger than right pulmonary masses. 2. Progressive thoracic adenopathy, consistent with nodal metastasis. Right lower jugular adenopathy is not significantly changed since the PET of 01/31/2015. 3. New small left pleural effusion. Left base airspace and ground-glass opacity could represent a component of postobstructive pneumonitis or less likely lymphangitic tumor spread. This is new since the prior PET. 4. No evidence of metastatic disease within the abdomen. Electronically Signed   By: Abigail Miyamoto M.D.   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  PLAN:    1. Lung cancer: CT scan results reviewed independently and reported as above with evidence of progressive disease. CT of the brain did not reveal intracranial disease. Patient had poor toleration to chemotherapy and XRT, therefore will proceed with immunotherapy using Tecentriq. Patient will receive 1200 mg IV every 3 weeks and will reimage after approximately 3 months. Given patient's bony metastasis will also give Zometa approximately every 4 weeks. Her most recent infusion of Zometa was on March 21, 2015. Return to clinic in 3 weeks for consideration of cycle 2. 2. Hypercalcemia: Improved. Secondary to malignancy. Tecentriq and Zometa as above. 3. Hypomagnesemia: Improved. Patient does not require IV supplementation today. Monitor. 4. Pain: Continue current narcotic regimen. 5. Urinary frequency: Requested patient return to the lab for UA and culture, but she did not prior to leaving. 6. Poor oxygen status: Have instructed patient to wear her oxygen 24 hours a day. We will contact insurance company to assess for a oxygen condenser.  Patient expressed understanding and was in agreement with this plan. She also understands that She can call clinic at any  time with any questions, concerns, or complaints.    Lloyd Huger, MD   04/09/2015 10:12 PM

## 2015-04-11 ENCOUNTER — Ambulatory Visit: Payer: PPO | Admitting: Family Medicine

## 2015-04-11 NOTE — Telephone Encounter (Signed)
Order faxed to [employer] for portable oxygen.

## 2015-04-12 ENCOUNTER — Ambulatory Visit: Payer: PPO

## 2015-04-12 ENCOUNTER — Other Ambulatory Visit: Payer: PPO

## 2015-04-12 ENCOUNTER — Inpatient Hospital Stay: Payer: PPO

## 2015-04-13 ENCOUNTER — Encounter: Payer: Self-pay | Admitting: *Deleted

## 2015-04-13 ENCOUNTER — Other Ambulatory Visit: Payer: Self-pay | Admitting: *Deleted

## 2015-04-13 ENCOUNTER — Inpatient Hospital Stay: Payer: PPO

## 2015-04-13 ENCOUNTER — Inpatient Hospital Stay: Payer: PPO | Attending: Oncology

## 2015-04-13 ENCOUNTER — Other Ambulatory Visit: Payer: PPO

## 2015-04-13 DIAGNOSIS — Z800 Family history of malignant neoplasm of digestive organs: Secondary | ICD-10-CM | POA: Insufficient documentation

## 2015-04-13 DIAGNOSIS — E119 Type 2 diabetes mellitus without complications: Secondary | ICD-10-CM | POA: Insufficient documentation

## 2015-04-13 DIAGNOSIS — R531 Weakness: Secondary | ICD-10-CM | POA: Diagnosis not present

## 2015-04-13 DIAGNOSIS — Z801 Family history of malignant neoplasm of trachea, bronchus and lung: Secondary | ICD-10-CM | POA: Diagnosis not present

## 2015-04-13 DIAGNOSIS — Z87891 Personal history of nicotine dependence: Secondary | ICD-10-CM | POA: Insufficient documentation

## 2015-04-13 DIAGNOSIS — R32 Unspecified urinary incontinence: Secondary | ICD-10-CM | POA: Insufficient documentation

## 2015-04-13 DIAGNOSIS — J449 Chronic obstructive pulmonary disease, unspecified: Secondary | ICD-10-CM | POA: Diagnosis not present

## 2015-04-13 DIAGNOSIS — Z87440 Personal history of urinary (tract) infections: Secondary | ICD-10-CM | POA: Diagnosis not present

## 2015-04-13 DIAGNOSIS — K5900 Constipation, unspecified: Secondary | ICD-10-CM | POA: Insufficient documentation

## 2015-04-13 DIAGNOSIS — Z7984 Long term (current) use of oral hypoglycemic drugs: Secondary | ICD-10-CM | POA: Insufficient documentation

## 2015-04-13 DIAGNOSIS — R630 Anorexia: Secondary | ICD-10-CM | POA: Diagnosis not present

## 2015-04-13 DIAGNOSIS — R0602 Shortness of breath: Secondary | ICD-10-CM | POA: Insufficient documentation

## 2015-04-13 DIAGNOSIS — Z8673 Personal history of transient ischemic attack (TIA), and cerebral infarction without residual deficits: Secondary | ICD-10-CM | POA: Diagnosis not present

## 2015-04-13 DIAGNOSIS — F418 Other specified anxiety disorders: Secondary | ICD-10-CM | POA: Diagnosis not present

## 2015-04-13 DIAGNOSIS — R918 Other nonspecific abnormal finding of lung field: Secondary | ICD-10-CM

## 2015-04-13 DIAGNOSIS — R591 Generalized enlarged lymph nodes: Secondary | ICD-10-CM | POA: Insufficient documentation

## 2015-04-13 DIAGNOSIS — E785 Hyperlipidemia, unspecified: Secondary | ICD-10-CM | POA: Diagnosis not present

## 2015-04-13 DIAGNOSIS — R634 Abnormal weight loss: Secondary | ICD-10-CM | POA: Diagnosis not present

## 2015-04-13 DIAGNOSIS — R0902 Hypoxemia: Secondary | ICD-10-CM | POA: Insufficient documentation

## 2015-04-13 DIAGNOSIS — C7801 Secondary malignant neoplasm of right lung: Secondary | ICD-10-CM | POA: Diagnosis not present

## 2015-04-13 DIAGNOSIS — C3432 Malignant neoplasm of lower lobe, left bronchus or lung: Secondary | ICD-10-CM | POA: Insufficient documentation

## 2015-04-13 DIAGNOSIS — I10 Essential (primary) hypertension: Secondary | ICD-10-CM | POA: Diagnosis not present

## 2015-04-13 DIAGNOSIS — R5383 Other fatigue: Secondary | ICD-10-CM | POA: Diagnosis not present

## 2015-04-13 DIAGNOSIS — Z9981 Dependence on supplemental oxygen: Secondary | ICD-10-CM | POA: Diagnosis not present

## 2015-04-13 DIAGNOSIS — R110 Nausea: Secondary | ICD-10-CM | POA: Insufficient documentation

## 2015-04-13 DIAGNOSIS — Z923 Personal history of irradiation: Secondary | ICD-10-CM | POA: Diagnosis not present

## 2015-04-13 DIAGNOSIS — Z809 Family history of malignant neoplasm, unspecified: Secondary | ICD-10-CM | POA: Diagnosis not present

## 2015-04-13 DIAGNOSIS — R350 Frequency of micturition: Secondary | ICD-10-CM

## 2015-04-13 DIAGNOSIS — Z79899 Other long term (current) drug therapy: Secondary | ICD-10-CM | POA: Diagnosis not present

## 2015-04-13 DIAGNOSIS — C3492 Malignant neoplasm of unspecified part of left bronchus or lung: Secondary | ICD-10-CM

## 2015-04-13 LAB — URINALYSIS COMPLETE WITH MICROSCOPIC (ARMC ONLY)
Bilirubin Urine: NEGATIVE
GLUCOSE, UA: NEGATIVE mg/dL
HGB URINE DIPSTICK: NEGATIVE
Ketones, ur: NEGATIVE mg/dL
NITRITE: NEGATIVE
RBC / HPF: NONE SEEN RBC/hpf (ref 0–5)
Specific Gravity, Urine: 1.025 (ref 1.005–1.030)
pH: 5.5 (ref 5.0–8.0)

## 2015-04-13 LAB — COMPREHENSIVE METABOLIC PANEL
ALBUMIN: 2.7 g/dL — AB (ref 3.5–5.0)
ALK PHOS: 82 U/L (ref 38–126)
ALT: 15 U/L (ref 14–54)
AST: 20 U/L (ref 15–41)
Anion gap: 6 (ref 5–15)
BUN: 11 mg/dL (ref 6–20)
CALCIUM: 11.2 mg/dL — AB (ref 8.9–10.3)
CO2: 31 mmol/L (ref 22–32)
CREATININE: 0.53 mg/dL (ref 0.44–1.00)
Chloride: 97 mmol/L — ABNORMAL LOW (ref 101–111)
GFR calc Af Amer: >60 mL/min (ref >60)
GFR calc non Af Amer: >60 mL/min (ref >60)
GLUCOSE: 132 mg/dL — AB (ref 65–99)
Potassium: 4.6 mmol/L (ref 3.5–5.1)
SODIUM: 134 mmol/L — AB (ref 135–145)
Total Bilirubin: 0.5 mg/dL (ref 0.3–1.2)
Total Protein: 7 g/dL (ref 6.5–8.1)

## 2015-04-13 LAB — CBC WITH DIFFERENTIAL/PLATELET
BASOS PCT: 0 %
Basophils Absolute: 0 10*3/uL (ref 0–0.1)
EOS ABS: 0.1 10*3/uL (ref 0–0.7)
Eosinophils Relative: 1 %
HCT: 33.3 % — ABNORMAL LOW (ref 35.0–47.0)
HEMOGLOBIN: 10.5 g/dL — AB (ref 12.0–16.0)
Lymphocytes Relative: 3 %
Lymphs Abs: 0.3 10*3/uL — ABNORMAL LOW (ref 1.0–3.6)
MCH: 27.5 pg (ref 26.0–34.0)
MCHC: 31.7 g/dL — ABNORMAL LOW (ref 32.0–36.0)
MCV: 86.7 fL (ref 80.0–100.0)
Monocytes Absolute: 0.9 10*3/uL (ref 0.2–0.9)
Monocytes Relative: 10 %
NEUTROS ABS: 8.6 10*3/uL — AB (ref 1.4–6.5)
NEUTROS PCT: 86 %
Platelets: 332 10*3/uL (ref 150–440)
RBC: 3.84 MIL/uL (ref 3.80–5.20)
RDW: 16 % — ABNORMAL HIGH (ref 11.5–14.5)
WBC: 10 10*3/uL (ref 3.6–11.0)

## 2015-04-13 LAB — MAGNESIUM: Magnesium: 1.8 mg/dL (ref 1.7–2.4)

## 2015-04-13 NOTE — Progress Notes (Unsigned)
Pt unable to collect urine specimen while in clinic today. Specimen container sent home with patient and pt and son were instructed on how to collect urine specimen. Informed pt to bring specimen back to cancer center lab in [place] when collected. Pt's son stated will bring back on Thursday since has to take medication later today and will be unable to drive.

## 2015-04-15 ENCOUNTER — Telehealth: Payer: Self-pay | Admitting: *Deleted

## 2015-04-15 LAB — URINE CULTURE

## 2015-04-15 MED ORDER — LEVOFLOXACIN 500 MG PO TABS: 500.0000 mg | ORAL_TABLET | Freq: Every day | ORAL | Status: DC

## 2015-04-15 NOTE — Telephone Encounter (Signed)
Asking about Urine results and what is to be done.

## 2015-04-15 NOTE — Telephone Encounter (Signed)
Levaquin 500 mg daily times 7 day se scribed per VO Dr B. Son informed

## 2015-04-18 ENCOUNTER — Other Ambulatory Visit: Payer: Self-pay

## 2015-04-18 MED ORDER — BENAZEPRIL HCL 20 MG PO TABS: 20.0000 mg | ORAL_TABLET | Freq: Every day | ORAL | Status: DC

## 2015-04-18 NOTE — Telephone Encounter (Signed)
Last Cr, K+ reviewed; rx approved

## 2015-04-18 NOTE — Telephone Encounter (Signed)
Routing to provider. One of your patients.

## 2015-04-19 NOTE — Telephone Encounter (Signed)
04/18/15 @ 4:44--received message from [employer] regarding portable oxygen, they are still waiting for insurance verification from insurance company.

## 2015-04-19 NOTE — Telephone Encounter (Signed)
Message from [employer] @ [employer]--they have received insurance benefits information from insurance company and the [employer] portable oxygen concentrator not going to be covered by insurance.

## 2015-04-22 ENCOUNTER — Inpatient Hospital Stay: Payer: PPO

## 2015-04-22 ENCOUNTER — Ambulatory Visit: Payer: PPO

## 2015-04-22 ENCOUNTER — Other Ambulatory Visit: Payer: PPO

## 2015-04-22 ENCOUNTER — Telehealth: Payer: Self-pay | Admitting: Oncology

## 2015-04-22 ENCOUNTER — Inpatient Hospital Stay: Payer: PPO | Admitting: Internal Medicine

## 2015-04-22 ENCOUNTER — Ambulatory Visit: Payer: PPO | Admitting: Hematology and Oncology

## 2015-04-22 ENCOUNTER — Telehealth: Payer: Self-pay | Admitting: Internal Medicine

## 2015-04-22 NOTE — Telephone Encounter (Signed)
Order was previously faxed to [place]

## 2015-04-22 NOTE — Telephone Encounter (Signed)
[employer] will be supplying her oxygen. [facility] WAS her supplier but said there was a timeframe last month that she didn't want tanks bc she wanted concentrator because she couldn't carry around tanks. Now [employer] says they can't get the tanks for her in enough time. [employer] says they can get her the concentrator but they need her medical records to verify that she has medical necessity. She needs this URGENTLY before weekend is through. They need to get the info faxed to [employer] ASAP: [phone] (fax)     Also: [phone] (phone - for Wells Guiles) Mickel Baas in [place] will fax available records asap.

## 2015-04-22 NOTE — Telephone Encounter (Signed)
Patient's husband called this morning to cx appt today due to heat and pt not having oxygen. They wanted to rs lab/md/tecentriq for Monday and Renita Papa said she would check with Dr. B and let me know. Patient is calling back to find out. Dr. B will be triple booked. He is seeing this pt for Dr. Oliva Bustard.

## 2015-04-22 NOTE — Telephone Encounter (Signed)
Just spoke with Wells Guiles at [facility]. She said the oxygen order needs to be signed by Grayland Ormond since the documentation in Nira Conn Smith's note on 04/01/15 will be critical in getting this oxygen order approved. Please have Finn sign the order. Thanks!

## 2015-04-22 NOTE — Telephone Encounter (Signed)
Mickel Baas is handling records. Can you please make sure that [employer] gets the order for Oxygen? Thanks! [phone] (fax) Also: [phone] (phone - for Wells Guiles)

## 2015-04-22 NOTE — Telephone Encounter (Signed)
Nira Conn laura forgot to include you on this

## 2015-04-25 ENCOUNTER — Telehealth: Payer: Self-pay | Admitting: Family Medicine

## 2015-04-25 ENCOUNTER — Inpatient Hospital Stay: Payer: PPO

## 2015-04-25 ENCOUNTER — Other Ambulatory Visit: Payer: Self-pay | Admitting: Family Medicine

## 2015-04-25 ENCOUNTER — Inpatient Hospital Stay: Payer: PPO | Admitting: Internal Medicine

## 2015-04-25 ENCOUNTER — Telehealth: Payer: Self-pay | Admitting: Oncology

## 2015-04-25 NOTE — Telephone Encounter (Signed)
As noted in previous phone message dated 04/04/15--on 04/19/15 received Message from [facility] @ [employer]--they have received insurance benefits information from insurance company and the [employer] portable oxygen concentrator not going to be covered by insurance.  I did speak with patient's son on 04/19/15 and [employer] rep called him to inform this as well.  But the son said that [employer] was having someone call him regarding financials.

## 2015-04-25 NOTE — Telephone Encounter (Signed)
Annie Main states that he started his mother on Doxycyline over the weekend for a UTI that her Levaquin was not helping. Demanded that she have Doxycyline called in for her. Advised [place] and Annie Main that they will need to be seen in the office for any new prescriptions. They both verbalized agreement to keep the previously scheduled appointment with Dr. Rogue Bussing at 0900 tomorrow morning.

## 2015-04-25 NOTE — Telephone Encounter (Signed)
Wells Guiles at [facility] found the order from 04/11/15 for portable oxygen and said that now the date has changed and she needs you to fill out/sign the [employer] one more time and she has just faxed it to [phone]. Please fax back to her: [phone]. If you have questions: [phone]

## 2015-04-26 ENCOUNTER — Inpatient Hospital Stay
Admission: EM | Admit: 2015-04-26 | Discharge: 2015-04-27 | DRG: 189 | Disposition: A | Discharge status: Hospice | Payer: PPO | Attending: Internal Medicine | Admitting: Internal Medicine

## 2015-04-26 ENCOUNTER — Inpatient Hospital Stay: Payer: PPO

## 2015-04-26 ENCOUNTER — Emergency Department: Payer: PPO

## 2015-04-26 ENCOUNTER — Inpatient Hospital Stay (HOSPITAL_BASED_OUTPATIENT_CLINIC_OR_DEPARTMENT_OTHER): Payer: PPO | Admitting: Internal Medicine

## 2015-04-26 ENCOUNTER — Encounter: Payer: Self-pay | Admitting: Intensive Care

## 2015-04-26 ENCOUNTER — Encounter: Payer: Self-pay | Admitting: Internal Medicine

## 2015-04-26 VITALS — BP 117/78 | HR 114 | Temp 98.6°F

## 2015-04-26 DIAGNOSIS — Z823 Family history of stroke: Secondary | ICD-10-CM

## 2015-04-26 DIAGNOSIS — R0602 Shortness of breath: Secondary | ICD-10-CM

## 2015-04-26 DIAGNOSIS — C7951 Secondary malignant neoplasm of bone: Secondary | ICD-10-CM | POA: Diagnosis present

## 2015-04-26 DIAGNOSIS — R5383 Other fatigue: Secondary | ICD-10-CM

## 2015-04-26 DIAGNOSIS — J440 Chronic obstructive pulmonary disease with acute lower respiratory infection: Secondary | ICD-10-CM | POA: Diagnosis present

## 2015-04-26 DIAGNOSIS — I509 Heart failure, unspecified: Secondary | ICD-10-CM

## 2015-04-26 DIAGNOSIS — Z880 Allergy status to penicillin: Secondary | ICD-10-CM | POA: Diagnosis not present

## 2015-04-26 DIAGNOSIS — Z7984 Long term (current) use of oral hypoglycemic drugs: Secondary | ICD-10-CM

## 2015-04-26 DIAGNOSIS — Z800 Family history of malignant neoplasm of digestive organs: Secondary | ICD-10-CM

## 2015-04-26 DIAGNOSIS — J189 Pneumonia, unspecified organism: Secondary | ICD-10-CM | POA: Diagnosis present

## 2015-04-26 DIAGNOSIS — Z79899 Other long term (current) drug therapy: Secondary | ICD-10-CM

## 2015-04-26 DIAGNOSIS — J45909 Unspecified asthma, uncomplicated: Secondary | ICD-10-CM | POA: Diagnosis present

## 2015-04-26 DIAGNOSIS — Z888 Allergy status to other drugs, medicaments and biological substances status: Secondary | ICD-10-CM

## 2015-04-26 DIAGNOSIS — E119 Type 2 diabetes mellitus without complications: Secondary | ICD-10-CM

## 2015-04-26 DIAGNOSIS — I10 Essential (primary) hypertension: Secondary | ICD-10-CM

## 2015-04-26 DIAGNOSIS — Z809 Family history of malignant neoplasm, unspecified: Secondary | ICD-10-CM | POA: Diagnosis not present

## 2015-04-26 DIAGNOSIS — F419 Anxiety disorder, unspecified: Secondary | ICD-10-CM | POA: Diagnosis present

## 2015-04-26 DIAGNOSIS — R32 Unspecified urinary incontinence: Secondary | ICD-10-CM | POA: Diagnosis not present

## 2015-04-26 DIAGNOSIS — C7801 Secondary malignant neoplasm of right lung: Secondary | ICD-10-CM | POA: Diagnosis not present

## 2015-04-26 DIAGNOSIS — Z87891 Personal history of nicotine dependence: Secondary | ICD-10-CM

## 2015-04-26 DIAGNOSIS — Z515 Encounter for palliative care: Secondary | ICD-10-CM | POA: Diagnosis present

## 2015-04-26 DIAGNOSIS — R0902 Hypoxemia: Secondary | ICD-10-CM | POA: Diagnosis not present

## 2015-04-26 DIAGNOSIS — Z8249 Family history of ischemic heart disease and other diseases of the circulatory system: Secondary | ICD-10-CM

## 2015-04-26 DIAGNOSIS — Z923 Personal history of irradiation: Secondary | ICD-10-CM

## 2015-04-26 DIAGNOSIS — Z8673 Personal history of transient ischemic attack (TIA), and cerebral infarction without residual deficits: Secondary | ICD-10-CM | POA: Diagnosis not present

## 2015-04-26 DIAGNOSIS — R634 Abnormal weight loss: Secondary | ICD-10-CM | POA: Diagnosis not present

## 2015-04-26 DIAGNOSIS — R630 Anorexia: Secondary | ICD-10-CM | POA: Diagnosis not present

## 2015-04-26 DIAGNOSIS — R591 Generalized enlarged lymph nodes: Secondary | ICD-10-CM

## 2015-04-26 DIAGNOSIS — J9621 Acute and chronic respiratory failure with hypoxia: Principal | ICD-10-CM | POA: Diagnosis present

## 2015-04-26 DIAGNOSIS — R110 Nausea: Secondary | ICD-10-CM | POA: Diagnosis not present

## 2015-04-26 DIAGNOSIS — C3432 Malignant neoplasm of lower lobe, left bronchus or lung: Secondary | ICD-10-CM | POA: Diagnosis present

## 2015-04-26 DIAGNOSIS — J449 Chronic obstructive pulmonary disease, unspecified: Secondary | ICD-10-CM

## 2015-04-26 DIAGNOSIS — Z9221 Personal history of antineoplastic chemotherapy: Secondary | ICD-10-CM | POA: Diagnosis not present

## 2015-04-26 DIAGNOSIS — Z66 Do not resuscitate: Secondary | ICD-10-CM | POA: Diagnosis present

## 2015-04-26 DIAGNOSIS — Z87440 Personal history of urinary (tract) infections: Secondary | ICD-10-CM

## 2015-04-26 DIAGNOSIS — E785 Hyperlipidemia, unspecified: Secondary | ICD-10-CM

## 2015-04-26 DIAGNOSIS — I429 Cardiomyopathy, unspecified: Secondary | ICD-10-CM | POA: Diagnosis present

## 2015-04-26 DIAGNOSIS — J9600 Acute respiratory failure, unspecified whether with hypoxia or hypercapnia: Secondary | ICD-10-CM

## 2015-04-26 DIAGNOSIS — Z9981 Dependence on supplemental oxygen: Secondary | ICD-10-CM | POA: Diagnosis not present

## 2015-04-26 DIAGNOSIS — K5900 Constipation, unspecified: Secondary | ICD-10-CM

## 2015-04-26 DIAGNOSIS — R531 Weakness: Secondary | ICD-10-CM | POA: Diagnosis not present

## 2015-04-26 DIAGNOSIS — J9620 Acute and chronic respiratory failure, unspecified whether with hypoxia or hypercapnia: Secondary | ICD-10-CM | POA: Diagnosis present

## 2015-04-26 DIAGNOSIS — F329 Major depressive disorder, single episode, unspecified: Secondary | ICD-10-CM | POA: Diagnosis present

## 2015-04-26 DIAGNOSIS — C3490 Malignant neoplasm of unspecified part of unspecified bronchus or lung: Secondary | ICD-10-CM | POA: Diagnosis present

## 2015-04-26 DIAGNOSIS — F418 Other specified anxiety disorders: Secondary | ICD-10-CM

## 2015-04-26 DIAGNOSIS — J90 Pleural effusion, not elsewhere classified: Secondary | ICD-10-CM

## 2015-04-26 DIAGNOSIS — C3492 Malignant neoplasm of unspecified part of left bronchus or lung: Secondary | ICD-10-CM

## 2015-04-26 DIAGNOSIS — Z801 Family history of malignant neoplasm of trachea, bronchus and lung: Secondary | ICD-10-CM

## 2015-04-26 DIAGNOSIS — R918 Other nonspecific abnormal finding of lung field: Secondary | ICD-10-CM

## 2015-04-26 HISTORY — DX: Unspecified asthma, uncomplicated: J45.909

## 2015-04-26 LAB — CBC WITH DIFFERENTIAL/PLATELET
Basophils Absolute: 0 10*3/uL (ref 0–0.1)
Basophils Absolute: 0 10*3/uL (ref 0–0.1)
Basophils Relative: 0 %
Basophils Relative: 0 %
EOS ABS: 0.1 10*3/uL (ref 0–0.7)
EOS ABS: 0.1 10*3/uL (ref 0–0.7)
EOS PCT: 1 %
Eosinophils Relative: 1 %
HCT: 32.2 % — ABNORMAL LOW (ref 35.0–47.0)
HEMATOCRIT: 34.2 % — AB (ref 35.0–47.0)
HEMOGLOBIN: 10.9 g/dL — AB (ref 12.0–16.0)
HEMOGLOBIN: 10.4 g/dL — AB (ref 12.0–16.0)
LYMPHS ABS: 0.3 10*3/uL — AB (ref 1.0–3.6)
LYMPHS ABS: 0.4 10*3/uL — AB (ref 1.0–3.6)
LYMPHS PCT: 3 %
Lymphocytes Relative: 3 %
MCH: 27 pg (ref 26.0–34.0)
MCH: 27.1 pg (ref 26.0–34.0)
MCHC: 32 g/dL (ref 32.0–36.0)
MCHC: 32.5 g/dL (ref 32.0–36.0)
MCV: 84.4 fL (ref 80.0–100.0)
MCV: 83.5 fL (ref 80.0–100.0)
MONO ABS: 1.1 10*3/uL — AB (ref 0.2–0.9)
MONOS PCT: 10 %
MONOS PCT: 11 %
Monocytes Absolute: 1.2 10*3/uL — ABNORMAL HIGH (ref 0.2–0.9)
NEUTROS PCT: 86 %
Neutro Abs: 9 10*3/uL — ABNORMAL HIGH (ref 1.4–6.5)
Neutro Abs: 8.9 10*3/uL — ABNORMAL HIGH (ref 1.4–6.5)
Neutrophils Relative %: 85 %
Platelets: 356 10*3/uL (ref 150–440)
Platelets: 334 10*3/uL (ref 150–440)
RBC: 4.06 MIL/uL (ref 3.80–5.20)
RBC: 3.85 MIL/uL (ref 3.80–5.20)
RDW: 16.6 % — ABNORMAL HIGH (ref 11.5–14.5)
RDW: 16.9 % — ABNORMAL HIGH (ref 11.5–14.5)
WBC: 10.4 10*3/uL (ref 3.6–11.0)
WBC: 10.6 10*3/uL (ref 3.6–11.0)

## 2015-04-26 LAB — COMPREHENSIVE METABOLIC PANEL
ALK PHOS: 80 U/L (ref 38–126)
ALK PHOS: 76 U/L (ref 38–126)
ALT: 18 U/L (ref 14–54)
ALT: 17 U/L (ref 14–54)
ANION GAP: 5 (ref 5–15)
ANION GAP: 9 (ref 5–15)
AST: 21 U/L (ref 15–41)
AST: 20 U/L (ref 15–41)
Albumin: 3 g/dL — ABNORMAL LOW (ref 3.5–5.0)
Albumin: 2.8 g/dL — ABNORMAL LOW (ref 3.5–5.0)
BUN: 9 mg/dL (ref 6–20)
BUN: 9 mg/dL (ref 6–20)
CALCIUM: 12.2 mg/dL — AB (ref 8.9–10.3)
CALCIUM: 12.7 mg/dL — AB (ref 8.9–10.3)
CHLORIDE: 98 mmol/L — AB (ref 101–111)
CO2: 34 mmol/L — AB (ref 22–32)
CO2: 33 mmol/L — AB (ref 22–32)
CREATININE: 0.45 mg/dL (ref 0.44–1.00)
Chloride: 97 mmol/L — ABNORMAL LOW (ref 101–111)
Creatinine, Ser: 0.49 mg/dL (ref 0.44–1.00)
GFR calc Af Amer: >60 mL/min (ref >60)
GFR calc non Af Amer: >60 mL/min (ref >60)
GLUCOSE: 116 mg/dL — AB (ref 65–99)
Glucose, Bld: 103 mg/dL — ABNORMAL HIGH (ref 65–99)
Potassium: 4.6 mmol/L (ref 3.5–5.1)
Potassium: 4.4 mmol/L (ref 3.5–5.1)
SODIUM: 139 mmol/L (ref 135–145)
Sodium: 137 mmol/L (ref 135–145)
Total Bilirubin: 0.6 mg/dL (ref 0.3–1.2)
Total Bilirubin: 0.7 mg/dL (ref 0.3–1.2)
Total Protein: 7.1 g/dL (ref 6.5–8.1)
Total Protein: 7 g/dL (ref 6.5–8.1)

## 2015-04-26 LAB — GLUCOSE, CAPILLARY
GLUCOSE-CAPILLARY: 99 mg/dL (ref 65–99)
GLUCOSE-CAPILLARY: 159 mg/dL — AB (ref 65–99)

## 2015-04-26 LAB — BLOOD GAS, ARTERIAL
ALLENS TEST (PASS/FAIL): POSITIVE — AB
Acid-Base Excess: 10.3 mmol/L — ABNORMAL HIGH (ref 0.0–3.0)
BICARBONATE: 38.1 meq/L — AB (ref 21.0–28.0)
FIO2: 40
O2 Saturation: 90.1 %
PATIENT TEMPERATURE: 37
PCO2 ART: 69 mmHg — AB (ref 32.0–48.0)
PH ART: 7.35 (ref 7.350–7.450)
PO2 ART: 62 mmHg — AB (ref 83.0–108.0)

## 2015-04-26 LAB — LACTIC ACID, PLASMA
Lactic Acid, Venous: 1 mmol/L (ref 0.5–2.0)
Lactic Acid, Venous: 1.2 mmol/L (ref 0.5–2.0)

## 2015-04-26 LAB — MAGNESIUM: Magnesium: 1.5 mg/dL — ABNORMAL LOW (ref 1.7–2.4)

## 2015-04-26 LAB — FIBRIN DERIVATIVES D-DIMER (ARMC ONLY): FIBRIN DERIVATIVES D-DIMER (ARMC): 1573 — AB (ref 0–499)

## 2015-04-26 LAB — TROPONIN I

## 2015-04-26 LAB — BRAIN NATRIURETIC PEPTIDE: B NATRIURETIC PEPTIDE 5: 45 pg/mL (ref 0.0–100.0)

## 2015-04-26 MED ORDER — CITALOPRAM HYDROBROMIDE 20 MG PO TABS: 40.0000 mg | ORAL_TABLET | Freq: Every day | ORAL | Status: DC

## 2015-04-26 MED ORDER — IOPAMIDOL (ISOVUE-370) INJECTION 76%
100.0000 mL | Freq: Once | INTRAVENOUS | Status: AC | PRN
  Administered 2015-04-26: 100 mL via INTRAVENOUS

## 2015-04-26 MED ORDER — PRAVASTATIN SODIUM 40 MG PO TABS: 40.0000 mg | ORAL_TABLET | Freq: Every day | ORAL | Status: DC

## 2015-04-26 MED ORDER — ACETAMINOPHEN 325 MG PO TABS: 650.0000 mg | ORAL_TABLET | Freq: Four times a day (QID) | ORAL | Status: DC | PRN

## 2015-04-26 MED ORDER — MAGNESIUM LACTATE 84 MG (7MEQ) PO TBCR
84.0000 mg | EXTENDED_RELEASE_TABLET | Freq: Two times a day (BID) | ORAL | Status: DC
  Administered 2015-04-26: 16:00:00 84 mg via ORAL
  Filled 2015-04-26 (×2): qty 1

## 2015-04-26 MED ORDER — MORPHINE SULFATE (CONCENTRATE) 10 MG/0.5ML PO SOLN: 5.0000 mg | ORAL | Status: AC | PRN

## 2015-04-26 MED ORDER — IPRATROPIUM-ALBUTEROL 0.5-2.5 (3) MG/3ML IN SOLN: 3.0000 mL | RESPIRATORY_TRACT | Status: DC | PRN

## 2015-04-26 MED ORDER — ISOSORBIDE MONONITRATE ER 30 MG PO TB24
30.0000 mg | ORAL_TABLET | Freq: Every day | ORAL | Status: DC
  Administered 2015-04-26: 30 mg via ORAL
  Filled 2015-04-26: qty 1

## 2015-04-26 MED ORDER — ONDANSETRON HCL 4 MG PO TABS: 4.0000 mg | ORAL_TABLET | Freq: Four times a day (QID) | ORAL | Status: DC | PRN

## 2015-04-26 MED ORDER — ACETAMINOPHEN 650 MG RE SUPP: 650.0000 mg | Freq: Four times a day (QID) | RECTAL | Status: DC | PRN

## 2015-04-26 MED ORDER — LEVOFLOXACIN IN D5W 750 MG/150ML IV SOLN
750.0000 mg | INTRAVENOUS | Status: DC
  Administered 2015-04-26: 750 mg via INTRAVENOUS
  Filled 2015-04-26 (×2): qty 150

## 2015-04-26 MED ORDER — MORPHINE SULFATE (CONCENTRATE) 10 MG/0.5ML PO SOLN
2.6000 mg | Freq: Four times a day (QID) | ORAL | Status: DC
  Administered 2015-04-26 – 2015-04-27 (×3): 2.6 mg via ORAL
  Filled 2015-04-26 (×2): qty 1

## 2015-04-26 MED ORDER — COENZYME Q10 30 MG PO CAPS: 30.0000 mg | ORAL_CAPSULE | Freq: Every day | ORAL | Status: DC

## 2015-04-26 MED ORDER — VITAMIN D 1000 UNITS PO TABS
2000.0000 [IU] | ORAL_TABLET | Freq: Every day | ORAL | Status: DC
  Administered 2015-04-26: 2000 [IU] via ORAL
  Filled 2015-04-26: qty 2

## 2015-04-26 MED ORDER — INSULIN ASPART 100 UNIT/ML ~~LOC~~ SOLN
0.0000 [IU] | Freq: Three times a day (TID) | SUBCUTANEOUS | Status: DC
  Administered 2015-04-26: 17:00:00 2 [IU] via SUBCUTANEOUS
  Filled 2015-04-26: qty 2

## 2015-04-26 MED ORDER — ENOXAPARIN SODIUM 40 MG/0.4ML ~~LOC~~ SOLN: 40.0000 mg | SUBCUTANEOUS | Status: DC

## 2015-04-26 MED ORDER — IPRATROPIUM-ALBUTEROL 0.5-2.5 (3) MG/3ML IN SOLN
3.0000 mL | Freq: Four times a day (QID) | RESPIRATORY_TRACT | Status: DC
  Administered 2015-04-26 – 2015-04-27 (×3): 3 mL via RESPIRATORY_TRACT
  Filled 2015-04-26 (×4): qty 3

## 2015-04-26 MED ORDER — MORPHINE SULFATE (CONCENTRATE) 10 MG/0.5ML PO SOLN
5.0000 mg | ORAL | Status: DC | PRN
  Filled 2015-04-26: qty 1

## 2015-04-26 MED ORDER — ENOXAPARIN SODIUM 40 MG/0.4ML ~~LOC~~ SOLN
40.0000 mg | SUBCUTANEOUS | Status: DC
Start: 2015-04-26 — End: 2015-04-26
  Administered 2015-04-26: 16:00:00 40 mg via SUBCUTANEOUS
  Filled 2015-04-26: qty 0.4

## 2015-04-26 MED ORDER — SODIUM CHLORIDE 0.9 % IV SOLN
INTRAVENOUS | Status: DC
  Administered 2015-04-26: 14:00:00 via INTRAVENOUS

## 2015-04-26 MED ORDER — LORAZEPAM 0.5 MG PO TABS: 0.5000 mg | ORAL_TABLET | ORAL | Status: DC | PRN

## 2015-04-26 MED ORDER — MORPHINE SULFATE (PF) 2 MG/ML IV SOLN
2.0000 mg | INTRAVENOUS | Status: DC | PRN
Start: 2015-04-26 — End: 2015-04-27

## 2015-04-26 MED ORDER — GLIMEPIRIDE 4 MG PO TABS
4.0000 mg | ORAL_TABLET | Freq: Every day | ORAL | Status: DC
  Filled 2015-04-26: qty 1

## 2015-04-26 MED ORDER — SENNA 8.6 MG PO TABS: 2.0000 | ORAL_TABLET | Freq: Every day | ORAL | Status: DC

## 2015-04-26 MED ORDER — LEVOFLOXACIN 500 MG PO TABS: 500.0000 mg | ORAL_TABLET | Freq: Every day | ORAL | Status: AC

## 2015-04-26 MED ORDER — INSULIN ASPART 100 UNIT/ML ~~LOC~~ SOLN: 0.0000 [IU] | Freq: Every day | SUBCUTANEOUS | Status: DC

## 2015-04-26 MED ORDER — BUDESONIDE 0.25 MG/2ML IN SUSP
0.2500 mg | Freq: Two times a day (BID) | RESPIRATORY_TRACT | Status: DC
  Administered 2015-04-26 – 2015-04-27 (×3): 0.25 mg via RESPIRATORY_TRACT
  Filled 2015-04-26 (×3): qty 2

## 2015-04-26 MED ORDER — BENAZEPRIL HCL 20 MG PO TABS
20.0000 mg | ORAL_TABLET | Freq: Every day | ORAL | Status: DC
  Administered 2015-04-26: 20 mg via ORAL
  Filled 2015-04-26: qty 2

## 2015-04-26 MED ORDER — IPRATROPIUM-ALBUTEROL 0.5-2.5 (3) MG/3ML IN SOLN: 3.0000 mL | Freq: Four times a day (QID) | RESPIRATORY_TRACT | Status: DC

## 2015-04-26 MED ORDER — PROCHLORPERAZINE 25 MG RE SUPP: 25.0000 mg | Freq: Two times a day (BID) | RECTAL | Status: DC | PRN

## 2015-04-26 MED ORDER — ADULT MULTIVITAMIN W/MINERALS CH
1.0000 | ORAL_TABLET | Freq: Two times a day (BID) | ORAL | Status: DC
  Administered 2015-04-26: 1 via ORAL
  Filled 2015-04-26: qty 1

## 2015-04-26 MED ORDER — VANCOMYCIN HCL IN DEXTROSE 1-5 GM/200ML-% IV SOLN
1000.0000 mg | Freq: Once | INTRAVENOUS | Status: AC
  Administered 2015-04-26: 1000 mg via INTRAVENOUS
  Filled 2015-04-26: qty 200

## 2015-04-26 MED ORDER — IPRATROPIUM-ALBUTEROL 0.5-2.5 (3) MG/3ML IN SOLN
3.0000 mL | Freq: Once | RESPIRATORY_TRACT | Status: AC
  Administered 2015-04-26: 3 mL via RESPIRATORY_TRACT
  Filled 2015-04-26: qty 3

## 2015-04-26 MED ORDER — OMEGA-3-ACID ETHYL ESTERS 1 G PO CAPS
1.0000 g | ORAL_CAPSULE | Freq: Every day | ORAL | Status: DC
  Administered 2015-04-26: 16:00:00 1 g via ORAL
  Filled 2015-04-26: qty 1

## 2015-04-26 MED ORDER — BISACODYL 10 MG RE SUPP: 10.0000 mg | RECTAL | Status: DC | PRN

## 2015-04-26 MED ORDER — METHYLPREDNISOLONE SODIUM SUCC 125 MG IJ SOLR
60.0000 mg | Freq: Four times a day (QID) | INTRAMUSCULAR | Status: DC
  Administered 2015-04-26 – 2015-04-27 (×4): 60 mg via INTRAVENOUS
  Filled 2015-04-26 (×5): qty 2

## 2015-04-26 MED ORDER — PANTOPRAZOLE SODIUM 40 MG PO TBEC
40.0000 mg | DELAYED_RELEASE_TABLET | Freq: Every day | ORAL | Status: DC
  Administered 2015-04-26 – 2015-04-27 (×2): 40 mg via ORAL
  Filled 2015-04-26 (×2): qty 1

## 2015-04-26 MED ORDER — RENA-VITE PO TABS
1.0000 | ORAL_TABLET | Freq: Every day | ORAL | Status: DC
  Administered 2015-04-26: 16:00:00 1 via ORAL
  Filled 2015-04-26: qty 1

## 2015-04-26 MED ORDER — AMLODIPINE BESYLATE 5 MG PO TABS
5.0000 mg | ORAL_TABLET | Freq: Every day | ORAL | Status: DC
  Administered 2015-04-26: 5 mg via ORAL
  Filled 2015-04-26: qty 1

## 2015-04-26 MED ORDER — GABAPENTIN 300 MG PO CAPS
300.0000 mg | ORAL_CAPSULE | Freq: Two times a day (BID) | ORAL | Status: DC
  Administered 2015-04-26: 16:00:00 300 mg via ORAL
  Filled 2015-04-26: qty 1

## 2015-04-26 MED ORDER — GLIMEPIRIDE 2 MG PO TABS
4.0000 mg | ORAL_TABLET | Freq: Every day | ORAL | Status: DC
  Filled 2015-04-26: qty 2

## 2015-04-26 NOTE — ED Notes (Signed)
PAtient arrived by EMS from cancer center. EMS reports "Patient was on her way to trx with no oxygen. When arriving her O2 sats were in the low 70's. PAtient wears continuous O2 2L at home but does not have O2 transfer tank." Patient has HX of stage 4 lung cancer that has spread to bones. Pt has been taking trx for a year. Patients only complaint is SOB. PAtient is A&O X4. Room Air sats 75%

## 2015-04-26 NOTE — H&P (Signed)
[facility] NAME: Sherry Mueller    MR#:  [id number]  DATE OF BIRTH:  [birth year]  DATE OF ADMISSION:  04/26/2015  PRIMARY CARE PHYSICIAN: Enid Derry, MD   REQUESTING/REFERRING PHYSICIAN: Dr. Meade Maw  CHIEF COMPLAINT:   Chief Complaint  Patient presents with  . Shortness of Breath    HISTORY OF PRESENT ILLNESS:  Sherry Mueller  is a 75 y.o. female with a known history of metastatic lung cancer. She is sent in from the cancer center for shortness of breath. Patient states that she has a rough time breathing going on for a few days. She feels tired. She's been coughing yellow phlegm. Her oncologist recommended a palliative care consultation. In the ER, they had a turn her oxygen up to 5 L nasal cannula in order to oxygenate. She was also found to be tachycardic and tachypneic and a left lower lobe postobstructive pneumonia. Hospitalist services were contacted for further evaluation.  PAST MEDICAL HISTORY:   Past Medical History  Diagnosis Date  . Stroke ([place])   . Hyperlipidemia   . Lung cancer ([place])   . Hypertension   . COPD (chronic obstructive pulmonary disease) ([place])   . Shortness of breath dyspnea   . Diabetes mellitus without complication ([place])   . Depression   . Anxiety   . Headache   . Hypomagnesemia 03/17/2015  . Urinary incontinence     PAST SURGICAL HISTORY:   Past Surgical History  Procedure Laterality Date  . Breast surgery Left cyst removed  . Cardiac catheterization    . Colonoscopy    . Endobronchial ultrasound Right 07/08/2014    Procedure: ENDOBRONCHIAL ULTRASOUND;  Surgeon: Vilinda Boehringer, MD;  Location: [facility] ORS;  Service: Cardiopulmonary;  Laterality: Right;    SOCIAL HISTORY:   Social History  Substance Use Topics  . Smoking status: Former Smoker -- 2.00 packs/day    Types: Cigarettes    Quit date: 06/14/2004  . Smokeless tobacco: Never Used  . Alcohol Use: No    FAMILY HISTORY:    Family History  Problem Relation Age of Onset  . Heart disease Mother   . Hypertension Mother   . Congestive Heart Failure Mother   . Heart disease Father   . Stroke Father   . Cancer Father     colon  . Cancer Maternal Uncle     lung  . Lung disease Maternal Grandmother   . Alcohol abuse Maternal Grandfather   . Cancer Paternal Grandmother   . Stroke Paternal Grandmother     DRUG ALLERGIES:   Allergies  Allergen Reactions  . Byetta 10 Mcg Pen [Exenatide] Nausea And Vomiting  . Hctz [Hydrochlorothiazide] Nausea And Vomiting  . Plavix [Clopidogrel] Nausea And Vomiting  . Penicillins Itching, Rash and Other (See Comments)    Has patient had a PCN reaction causing immediate rash, facial/tongue/throat swelling, SOB or lightheadedness with hypotension: No Has patient had a PCN reaction causing severe rash involving mucus membranes or skin necrosis: No Has patient had a PCN reaction that required hospitalization No Has patient had a PCN reaction occurring within the last 10 years: No If all of the above answers are "NO", then may proceed with Cephalosporin use.    REVIEW OF SYSTEMS:  CONSTITUTIONAL: No fever. Positive for fatigue. Positive for night sweats EYES: No blurred or double vision. Wears glasses. EARS, NOSE, AND THROAT: No tinnitus or ear pain. No sore throat. Positive for runny nose. Occasional dysphagia.  RESPIRATORY: Positive for cough and shortness of breath, no wheezing or hemoptysis.  CARDIOVASCULAR: No chest pain, orthopnea, edema.  GASTROINTESTINAL: Positive for nausea, no vomiting, diarrhea or abdominal pain. No blood in bowel movements GENITOURINARY: No dysuria, hematuria. Increased urinary frequency. ENDOCRINE: No polyuria, nocturia,  HEMATOLOGY: No anemia, easy bruising or bleeding SKIN: No rash or lesion. MUSCULOSKELETAL: Positive for leg pains NEUROLOGIC: No tingling, numbness, weakness.  PSYCHIATRY: No anxiety or depression.   MEDICATIONS AT HOME:    Prior to Admission medications   Medication Sig Start Date End Date Taking? Authorizing Provider  albuterol (PROVENTIL HFA;VENTOLIN HFA) 108 (90 Base) MCG/ACT inhaler Inhale 2 puffs into the lungs every 4 (four) hours as needed for wheezing or shortness of breath. 01/04/15  Yes Arnetha Courser, MD  amLODipine (NORVASC) 5 MG tablet Take 5 mg by mouth daily.   Yes Historical Provider, MD  b complex vitamins tablet Take 1 tablet by mouth daily.   Yes Historical Provider, MD  benazepril (LOTENSIN) 20 MG tablet Take 1 tablet (20 mg total) by mouth daily. 04/18/15  Yes Arnetha Courser, MD  cholecalciferol (VITAMIN D) 1000 units tablet Take 2,000 Units by mouth daily.   Yes Historical Provider, MD  citalopram (CELEXA) 40 MG tablet Take 40 mg by mouth at bedtime.   Yes Historical Provider, MD  co-enzyme Q-10 30 MG capsule Take 30 mg by mouth daily.   Yes Historical Provider, MD  gabapentin (NEURONTIN) 300 MG capsule Take 1 capsule (300 mg total) by mouth 2 (two) times daily. 01/05/15  Yes Forest Gleason, MD  glimepiride (AMARYL) 4 MG tablet Take 4 mg by mouth daily with breakfast.   Yes Historical Provider, MD  isosorbide mononitrate (IMDUR) 30 MG 24 hr tablet Take 30 mg by mouth daily.   Yes Historical Provider, MD  lansoprazole (PREVACID) 30 MG capsule Take 30 mg by mouth daily at 12 noon.   Yes Historical Provider, MD  magnesium chloride (SLOW-MAG) 64 MG TBEC SR tablet Take 1 tablet by mouth 2 (two) times daily.   Yes Historical Provider, MD  metFORMIN (GLUCOPHAGE-XR) 500 MG 24 hr tablet Take 1,000 mg by mouth 2 (two) times daily with a meal.   Yes Historical Provider, MD  Multiple Vitamin (MULTIVITAMIN WITH MINERALS) TABS tablet Take 1 tablet by mouth 2 (two) times daily.   Yes Historical Provider, MD  omega-3 acid ethyl esters (LOVAZA) 1 g capsule Take 1 g by mouth daily.   Yes Historical Provider, MD  ondansetron (ZOFRAN) 4 MG tablet Take 1 tablet (4 mg total) by mouth every 6 (six) hours as needed for  nausea or vomiting. 09/09/14  Yes Forest Gleason, MD  pravastatin (PRAVACHOL) 40 MG tablet Take 1 tablet (40 mg total) by mouth at bedtime. 04/06/15  Yes Arnetha Courser, MD      VITAL SIGNS:  Blood pressure 119/66, pulse 113, temperature 97.7 F (36.5 C), temperature source Oral, resp. rate 31, weight 113.853 kg (251 lb), SpO2 89 %.  PHYSICAL EXAMINATION:  GENERAL:  75 y.o.-year-old patient lying in the bed with no acute distress.  EYES: Pupils equal, round, reactive to light and accommodation. No scleral icterus. Extraocular muscles intact.  HEENT: Head atraumatic, normocephalic. Oropharynx and nasopharynx clear.  NECK:  Supple, no jugular venous distention. No thyroid enlargement, no tenderness.  LUNGS: Decreased breath sounds bilaterally, slight expiratory wheezing, no rales,rhonchi or crepitation. No use of accessory muscles of respiration.  CARDIOVASCULAR: S1, S2 normal tachycardic. No murmurs, rubs, or gallops.  ABDOMEN:  Soft, nontender, nondistended. Bowel sounds present. No organomegaly or mass.  EXTREMITIES: Trace pedal edema, no cyanosis, or clubbing.  NEUROLOGIC: Cranial nerves II through XII are intact. Muscle strength 5/5 in all extremities. Sensation intact. Gait not checked.  PSYCHIATRIC: The patient is alert and oriented x 3.  SKIN: No rash, lesion, or ulcer.   LABORATORY PANEL:   CBC  Recent Labs Lab 04/26/15 1216  WBC 10.6  HGB 10.4*  HCT 32.2*  PLT 334   ------------------------------------------------------------------------------------------------------------------  Chemistries   Recent Labs Lab 04/26/15 0952 04/26/15 1216  NA 137 139  K 4.6 4.4  CL 98* 97*  CO2 34* 33*  GLUCOSE 116* 103*  BUN 9 9  CREATININE 0.49 0.45  CALCIUM 12.2* 12.7*  MG 1.5*  --   AST 21 20  ALT 18 17  ALKPHOS 80 76  BILITOT 0.6 0.7   ------------------------------------------------------------------------------------------------------------------  Cardiac  Enzymes  Recent Labs Lab 04/26/15 1216  TROPONINI <0.03   ------------------------------------------------------------------------------------------------------------------  RADIOLOGY:  Dg Chest Port 1 View  04/26/2015  CLINICAL DATA:  Stage IV lung cancer. Hypoxemia today. Initial encounter. EXAM: PORTABLE CHEST 1 VIEW COMPARISON:  CT chest 03/29/2015.  PA and lateral chest 12/30/2013. FINDINGS: Bulky mediastinal lymphadenopathy with a left hilar and lower lobe mass are seen. There is opacity throughout the lower 1/2 of the left chest. Aeration is markedly worsening compared the prior CT. No consolidative process on the right is identified. The lungs are emphysematous heart size is normal. IMPRESSION: Extensive opacity in the left mid and lower lung zones has worsened since the recent CT scan and is consistent with the patient's known left lower lobe mass. Increased airspace opacity may be due to postobstructive pneumonitis or pneumonia. Bulky mediastinal and hilar lymphadenopathy. Emphysema. Electronically Signed   By: Inge Rise M.D.   On: 04/26/2015 12:37    EKG:   Sinus tachycardia 115 bpm. Right bundle branch block, left anterior fascicular block. Nonspecific ST-T wave changes.  IMPRESSION AND PLAN:   1. Acute on chronic respiratory failure. Normally she wears 2 L nasal cannula. She is up to 5 L nasal cannula. I will obtain an ABG. Obtain a CT scan of the chest to rule out pulmonary embolism. Patient made a DO NOT RESUSCITATE. 2. Postobstructive pneumonia. Start Levaquin and continue to monitor. Will also start Solu-Medrol just in case COPD exacerbation. 3. Metastatic squamous cell carcinoma stage IV. Overall prognosis is poor. Palliative care consultation. Patient is a DO NOT RESUSCITATE. 4. Hypercalcemia of malignancy. Give IV fluid hydration. As per oncology she is on Zometa as outpatient. 5. Hyperlipidemia unspecified on pravastatin 6. Essential hypertension on Norvasc 7.  Type 2 diabetes on glimepiride.  All the records are reviewed and case discussed with ED provider. Management plans discussed with the patient, and she is in agreement.  CODE STATUS: DO NOT RESUSCITATE  TOTAL TIME TAKING CARE OF THIS PATIENT: 50 minutes.    Loletha Grayer M.D on 04/26/2015 at 1:47 PM  Between 7am to 6pm - Pager - [phone]  After 6pm call admission pager [phone]  [facility]  [phone]  CC: Primary care physician; Enid Derry, MD

## 2015-04-26 NOTE — Progress Notes (Signed)
Palliative Care Update  I went to see pt somewhat urgently, as there was a request placed to move pt from the floor to the ICU and nursing staff and I wanted to be sure that pt and family was aware of what might go on if care is escalated.  Pt has metastatic lung cance and was sent here from the [facility] for dyspnea.    She has a left lower lobe obstructive pneumonia. She is DNR status.    I have reviewed Dr. Aletha Halim notes and have talked with pt's son and also with pt.  Pt does NOT want to be moved to the ICU. She does not want oxygen care escalated to Hi Flow or Bipap (these were explained to her and son was quick to decline and then pt was also). Face mask O2 might be OK --but ONLY if it will let pt go home tomorrow. That is the main thing she wants. She and son are interested in Hospice care if that will facilitate her going home and staying home.  They want to stop any further treatment and that seems reasonable based on the medical facts as outlined by Dr Aletha Halim note from earlier today when pt was seen in his clinic.  See orders for a MODIFIED comfort approach. We will continue ABX and some other meds but will set into motion the process and orders needed for pt to go home with Hospice care tomorrow.  She has already declined anything like Hospice Home.  Care Mgr will present choice of hospice agencies tomorrow and then we can get things set up for this to take place. They are fine with starting some morphine instead of going up on oxygen.  See orders.    Also see full initial consult note to follow.   Kirby Funk MD

## 2015-04-26 NOTE — Progress Notes (Signed)
Patient oh chronic 02, brought to facility with no oxygen tank.  Patient placed on 3L of oxygen with nasal canula, oxygen saturation level 82% and HR 114 bpm.  Patient has productive cough, BLE edema, bilateral rhonchi.

## 2015-04-26 NOTE — ED Provider Notes (Signed)
Time Seen: Approximately 1040  I have reviewed the triage notes  Chief Complaint: Shortness of Breath   History of Present Illness: Sherry Mueller is a 75 y.o. female *who presents via EMS from the cancer center. Patient was on her way for chemotherapeutic treatment without her home oxygen was noted to have pulse oximetry in the low 70s. She normally has a low room air pulse ox secondary to a history of lung cancer. Patient's currently on active chemotherapy and normally resides at home on a 2 L nasal cannula. Patient's had increased shortness of breath and was placed on supplemental oxygen here and via EMS transport. On her typical 2 L she still sats in the low 80s. Patient was moved to a 4-1/2 L nasal cannula. Patient denies any physical complaints other than shortness of breath. She is not aware of any fever at home. She denies any abdominal pain, nausea, vomiting. Patient has had a persistent shortness of breath. Past Medical History  Diagnosis Date  . Stroke ([place])   . Hyperlipidemia   . Lung cancer ([place])   . Hypertension   . COPD (chronic obstructive pulmonary disease) ([place])   . Shortness of breath dyspnea   . Diabetes mellitus without complication ([place])   . Depression   . Anxiety   . Headache   . Hypomagnesemia 03/17/2015  . Urinary incontinence   . Asthma     Patient Active Problem List   Diagnosis Date Noted  . Acute on chronic respiratory failure ([place]) 04/26/2015  . Cancer ([place]) 03/25/2015  . Hypomagnesemia 03/17/2015  . Medication monitoring encounter 01/04/2015  . Hypoxemia 09/16/2014  . Squamous cell lung cancer ([place]) 07/15/2014  . Mass of lower lobe of right lung   . Lung mass 06/23/2014  . Restrictive lung disease 06/15/2014  . Pulmonary nodule 06/15/2014  . Diabetes mellitus type 2, uncontrolled ([place]) 06/15/2014  . Benign hypertension 06/15/2014  . Hyperlipidemia 06/15/2014  . Morbid obesity ([place]) 06/15/2014  . Fatty liver 06/15/2014  . Mild asphyxia  06/15/2014  . Allergic rhinitis due to pollen 06/15/2014    Past Surgical History  Procedure Laterality Date  . Breast surgery Left cyst removed  . Cardiac catheterization    . Colonoscopy    . Endobronchial ultrasound Right 07/08/2014    Procedure: ENDOBRONCHIAL ULTRASOUND;  Surgeon: Vilinda Boehringer, MD;  Location: [facility] ORS;  Service: Cardiopulmonary;  Laterality: Right;    Past Surgical History  Procedure Laterality Date  . Breast surgery Left cyst removed  . Cardiac catheterization    . Colonoscopy    . Endobronchial ultrasound Right 07/08/2014    Procedure: ENDOBRONCHIAL ULTRASOUND;  Surgeon: Vilinda Boehringer, MD;  Location: [facility] ORS;  Service: Cardiopulmonary;  Laterality: Right;    Current Outpatient Rx  Name  Route  Sig  Dispense  Refill  . albuterol (PROVENTIL HFA;VENTOLIN HFA) 108 (90 Base) MCG/ACT inhaler   Inhalation   Inhale 2 puffs into the lungs every 4 (four) hours as needed for wheezing or shortness of breath.   1 Inhaler   2   . amLODipine (NORVASC) 5 MG tablet   Oral   Take 5 mg by mouth daily.         Marland Kitchen b complex vitamins tablet   Oral   Take 1 tablet by mouth daily.         . benazepril (LOTENSIN) 20 MG tablet   Oral   Take 1 tablet (20 mg total) by mouth daily.   90 tablet  0     Pharmacist -- we're decreasing the dose; cancel th ...   . cholecalciferol (VITAMIN D) 1000 units tablet   Oral   Take 2,000 Units by mouth daily.         . citalopram (CELEXA) 40 MG tablet   Oral   Take 40 mg by mouth at bedtime.         Marland Kitchen co-enzyme Q-10 30 MG capsule   Oral   Take 30 mg by mouth daily.         Marland Kitchen gabapentin (NEURONTIN) 300 MG capsule   Oral   Take 1 capsule (300 mg total) by mouth 2 (two) times daily.   60 capsule   0   . glimepiride (AMARYL) 4 MG tablet   Oral   Take 4 mg by mouth daily with breakfast.         . isosorbide mononitrate (IMDUR) 30 MG 24 hr tablet   Oral   Take 30 mg by mouth daily.         . lansoprazole  (PREVACID) 30 MG capsule   Oral   Take 30 mg by mouth daily at 12 noon.         . magnesium chloride (SLOW-MAG) 64 MG TBEC SR tablet   Oral   Take 1 tablet by mouth 2 (two) times daily.         . metFORMIN (GLUCOPHAGE-XR) 500 MG 24 hr tablet   Oral   Take 1,000 mg by mouth 2 (two) times daily with a meal.         . Multiple Vitamin (MULTIVITAMIN WITH MINERALS) TABS tablet   Oral   Take 1 tablet by mouth 2 (two) times daily.         Marland Kitchen omega-3 acid ethyl esters (LOVAZA) 1 g capsule   Oral   Take 1 g by mouth daily.         . ondansetron (ZOFRAN) 4 MG tablet   Oral   Take 1 tablet (4 mg total) by mouth every 6 (six) hours as needed for nausea or vomiting.   30 tablet   3   . pravastatin (PRAVACHOL) 40 MG tablet   Oral   Take 1 tablet (40 mg total) by mouth at bedtime.   30 tablet   2     New lower dose     Allergies:  Byetta 10 mcg pen; Hctz; Plavix; and Penicillins  Family History: Family History  Problem Relation Age of Onset  . Heart disease Mother   . Hypertension Mother   . Congestive Heart Failure Mother   . Heart disease Father   . Stroke Father   . Cancer Father     colon  . Cancer Maternal Uncle     lung  . Lung disease Maternal Grandmother   . Alcohol abuse Maternal Grandfather   . Cancer Paternal Grandmother   . Stroke Paternal Grandmother     Social History: Social History  Substance Use Topics  . Smoking status: Former Smoker -- 2.00 packs/day    Types: Cigarettes    Quit date: 06/14/2004  . Smokeless tobacco: Never Used  . Alcohol Use: No     Review of Systems:   10 point review of systems was performed and was otherwise negative:  Constitutional: No fever Eyes: No visual disturbances ENT: No sore throat, ear pain Cardiac: No chest pain Respiratory: Increased shortness of breath above baseline, wheezing, or stridor Abdomen: No abdominal pain, no vomiting, No  diarrhea Endocrine: No weight loss, No night  sweats Extremities: No peripheral edema, cyanosis Skin: No rashes, easy bruising Neurologic: No focal weakness, trouble with speech or swollowing Urologic: No dysuria, Hematuria, or urinary frequency  Physical Exam:  ED Triage Vitals  Enc Vitals Group     BP 04/26/15 1144 129/76 mmHg     Pulse Rate 04/26/15 1145 110     Resp 04/26/15 1145 21     Temp 04/26/15 1145 97.7 F (36.5 C)     Temp Source 04/26/15 1145 Oral     SpO2 04/26/15 1145 95 %     Weight 04/26/15 1145 251 lb (113.853 kg)     Height --      Head Cir --      Peak Flow --      Pain Score --      Pain Loc --      Pain Edu? --      Excl. in [place]? --     General: Awake , Alert , and Oriented times 3; GCS 15 Head: Normal cephalic , atraumatic Eyes: Pupils equal , round, reactive to light Nose/Throat: No nasal drainage, patent upper airway without erythema or exudate.  Neck: Supple, Full range of motion, No anterior adenopathy or palpable thyroid masses Lungs: Patient has bilateral rhonchi auscultated the bases with some mild upper expiratory wheezing Heart: Regular rate, regular rhythm without murmurs , gallops , or rubs Abdomen: Soft, non tender without rebound, guarding , or rigidity; bowel sounds positive and symmetric in all 4 quadrants. No organomegaly .        Extremities: Left leg swelling in comparison to the right. There is circumferential without any obvious focal calf tenderness or palpable cord. Negative Homans sign Neurologic: normal ambulation, Motor symmetric without deficits, sensory intact Skin: warm, dry, no rashes   Labs:   All laboratory work was reviewed including any pertinent negatives or positives listed below:  Labs Reviewed  CBC WITH DIFFERENTIAL/PLATELET - Abnormal; Notable for the following:    Hemoglobin 10.4 (*)    HCT 32.2 (*)    RDW 16.9 (*)    Neutro Abs 8.9 (*)    Lymphs Abs 0.4 (*)    Monocytes Absolute 1.2 (*)    All other components within normal limits  COMPREHENSIVE  METABOLIC PANEL - Abnormal; Notable for the following:    Chloride 97 (*)    CO2 33 (*)    Glucose, Bld 103 (*)    Calcium 12.7 (*)    Albumin 2.8 (*)    All other components within normal limits  FIBRIN DERIVATIVES D-DIMER ([facility] ONLY) - Abnormal; Notable for the following:    Fibrin derivatives D-dimer [facility]) 1573 (*)    All other components within normal limits  BLOOD GAS, ARTERIAL - Abnormal; Notable for the following:    pCO2 arterial 69 (*)    pO2, Arterial 62 (*)    Bicarbonate 38.1 (*)    Acid-Base Excess 10.3 (*)    Allens test (pass/fail) POSITIVE (*)    All other components within normal limits  CULTURE, BLOOD (ROUTINE X 2)  CULTURE, BLOOD (ROUTINE X 2)  TROPONIN I  BRAIN NATRIURETIC PEPTIDE  LACTIC ACID, PLASMA  LACTIC ACID, PLASMA    EKG:  ED ECG REPORT I, Daymon Larsen, the attending physician, personally viewed and interpreted this ECG.  Date: 04/26/2015 EKG Time: 1150 Rate 115 Rhythm: normal sinus rhythm QRS Axis: normal Intervals: Right bundle-branch block and left anterior fascicular block with  right axis deviation ST/T Wave abnormalities: normal Conduction Disturbances: none Narrative Interpretation: unremarkable No obvious acute ischemic changes  Radiology:   EXAM: PORTABLE CHEST 1 VIEW  COMPARISON: CT chest 03/29/2015. PA and lateral chest 12/30/2013.  FINDINGS: Bulky mediastinal lymphadenopathy with a left hilar and lower lobe mass are seen. There is opacity throughout the lower 1/2 of the left chest. Aeration is markedly worsening compared the prior CT. No consolidative process on the right is identified. The lungs are emphysematous heart size is normal.  IMPRESSION: Extensive opacity in the left mid and lower lung zones has worsened since the recent CT scan and is consistent with the patient's known left lower lobe mass. Increased airspace opacity may be due to postobstructive pneumonitis or pneumonia.  Bulky mediastinal and hilar  lymphadenopathy.  Emphysema.   I personally reviewed the radiologic studies     Critical Care CRITICAL CARE Performed by: Daymon Larsen   Total critical care time: 35 minutes  Critical care time was exclusive of separately billable procedures and treating other patients.  Critical care was necessary to treat or prevent imminent or life-threatening deterioration.  Critical care was time spent personally by me on the following activities: development of treatment plan with patient and/or surrogate as well as nursing, discussions with consultants, evaluation of patient's response to treatment, examination of patient, obtaining history from patient or surrogate, ordering and performing treatments and interventions, ordering and review of laboratory studies, ordering and review of radiographic studies, pulse oximetry and re-evaluation of patient's condition. Initial evaluation and treatment for respiratory distress   ED Course:  patient was placed on 4-1/2 L nasal cannula and was given a DuoNeb. Emergency department. She appears to have some postobstructive pneumonia with differential including pulmonary edema, pneumothorax, exacerbation of COPD, etc. Her cancer is seems to be progressing regardless of the chemotherapy at this time. Patient was started on IV antibiotics for postobstructive pneumonia. Patient's otherwise does not appear to be septic at this time though further laboratory work and testing is pending    Assessment: * Postobstructive pneumonia Lung cancer*   Final Clinical Impression:   Final diagnoses:  Postobstructive pneumonia     Plan:  Inpatient management           Daymon Larsen, MD 04/26/15 1423

## 2015-04-26 NOTE — Consult Note (Signed)
Palliative Medicine Inpatient Consult Note   Name: Sherry Mueller Date: 04/26/2015 MRN: [id number]  DOB: [birth year]  Referring Physician: Bettey Costa, MD  Palliative Care consult requested for this 75 y.o. female for goals of medical therapy in patient with acute respiratory failure due.  TODAY'S DISCUSSIONS AND PLANS: I went to see pt somewhat urgently, as there was a request placed to move pt from the floor to the ICU and nursing staff and I wanted to be sure that pt and family was aware of what might go on if care is escalated. Pt has metastatic lung cance and was sent here from the [facility] for dyspnea. She has a left lower lobe obstructive pneumonia. She is DNR status.   I have reviewed Dr. Aletha Halim notes and have talked with pt's son and also with pt. Pt does NOT want to be moved to the ICU. She does not want oxygen care escalated to Hi Flow or Bipap (these were explained to her and son was quick to decline and then pt was also). Face mask O2 might be OK --but ONLY if it will let pt go home tomorrow. That is the main thing she wants. She and son are interested in Hospice care if that will facilitate her going home and staying home. They want to stop any further treatment and that seems reasonable based on the medical facts as outlined by Dr Aletha Halim note from earlier today when pt was seen in his clinic.  See orders for a MODIFIED comfort approach. We will continue ABX and some other meds but will set into motion the process and orders needed for pt to go home with Hospice care tomorrow. She has already declined anything like Hospice Home. Care Mgr will present choice of hospice agencies tomorrow and then we can get things set up for this to take place. They are fine with starting some morphine instead of going up on oxygen.  See orders. I went to see pt somewhat urgently, as there was a request placed to move pt from the floor to the ICU and nursing staff and I wanted to  be sure that pt and family was aware of what might go on if care is escalated. Pt has metastatic lung cance and was sent here from the [facility] for dyspnea. She has a left lower lobe obstructive pneumonia. She is DNR status.   I have reviewed Dr. Aletha Halim notes and have talked with pt's son and also with pt. Pt does NOT want to be moved to the ICU. She does not want oxygen care escalated to Hi Flow or Bipap (these were explained to her and son was quick to decline and then pt was also). Face mask O2 might be OK --but ONLY if it will let pt go home tomorrow. That is the main thing she wants. She and son are interested in Hospice care if that will facilitate her going home and staying home. They want to stop any further treatment and that seems reasonable based on the medical facts as outlined by Dr Aletha Halim note from earlier today when pt was seen in his clinic.  See orders for a MODIFIED comfort approach. We will continue ABX and some other meds but will set into motion the process and orders needed for pt to go home with Hospice care tomorrow. She has already declined anything like Hospice Home. Care Mgr will present choice of hospice agencies tomorrow and then we can get things set up for this to  take place. They are fine with starting some morphine instead of going up on oxygen.  See orders.    CLINICAL NARRATIVE: Pt is a 75 yo woman with metastatic lung cancer who was sent to the ER and subsequently admitted due to hypoxia and dyspnea.  She has a post obstructive pneumonia.  She had a chest Ct on 06/18/14 that showed a 5 x 3.8 x 4.7 cm right retrohilar mass that was spiculated and felt to be a primary lung cancer.  There was also some hilar adenopathy and other small pulmonary nodules.  She started losing weight in 2016.  PET scan and head CT showed nodes c/w mets but no distant mets in June 2016.  Bronchoscopy by Dr. Stevenson Clinch in July 2016, showed extrinsic compression of the  right lower lobe due to the mass.   Pathology confirmed squamous cell cancer. 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 She went for radiation.   She started out seeing Dr. Mike Gip but then saw Dr. Oliva Bustard.  When he was on vacation, she saw Dr. Grayland Ormond.  Now, pt is transferred to Dr. Burlene Arnt.    Pt was to have gotten some chemo that mandated decadron prior to administration. Pt refused the decadron due to fears of this increasing her glucose numbers.  This caused some chemo to be cancelled. She did begin chemo later though she later again declined some chemo and also radiation due to the side effects.  She had Shingles in Jan 2017 on left lower back.  This past February, she had some evidence on PET scan of progressive disease. Symptoms started to worsen with more coughing and shortness of breath. During a visit Dr. Grayland Ormond on April 11, 2015,  Stage IV lung cancer was noted to be present with mets in bones and also presence of hypercalcemia of malignancy.  She has begun being on oxygen round the clock instead of as needed. She was to starte immunotherapy with Tecentriq every 3 weeks.   Zometa was also to be given every 4 weeks due to the bony mets. She had a UTI treated recently with Levaquin.    She had the first cycle of this palliative chemo and then went to the [facility] today, 04/26/15, to see Dr. Burlene Arnt, about consideration for cycle two.  She was too short of breath to get chemo and was sent to the ED.  Prior to being sent to the ED, Dr. Janeth Rase discussed options including palliative care and even hospice consults. Her functional status is now quite poor and risks of adverse effects is quite high if she were to continue with chemo etc.    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  SOCIAL HISTORY:  reports that she quit smoking about 10 years ago. Her smoking use included Cigarettes. She smoked 2.00 packs per day. She has never used smokeless tobacco. She reports that she does not drink alcohol or use illicit drugs.  She lives at home with her disabled (but ambulatory and fully alert and oriented ) son.  He is on pain medications for his own illnesses, but says he has been able to deal with  his mother's needs and he is asking for hospice care in the home.  He will continue to be caregiver.  Pt started smoking at age 55 and smoked 2-3 packs per day until about 2007.    LEGAL DOCUMENTS:  none  CODE STATUS: DNR  PAST MEDICAL HISTORY: Past Medical History  Diagnosis Date  . Stroke ([place])   . Hyperlipidemia   . Lung cancer ([place])   . Hypertension   . COPD (chronic obstructive pulmonary disease) ([place])   . Shortness of breath dyspnea   . Diabetes mellitus without complication ([place])   . Depression   . Anxiety   . Headache   . Hypomagnesemia 03/17/2015  . Urinary incontinence   . Asthma     PAST SURGICAL HISTORY:  Past Surgical History  Procedure Laterality Date  . Breast surgery Left cyst removed  . Cardiac catheterization    . Colonoscopy    . Endobronchial ultrasound Right 07/08/2014    Procedure: ENDOBRONCHIAL ULTRASOUND;  Surgeon: Vilinda Boehringer, MD;  Location: [facility] ORS;  Service: Cardiopulmonary;  Laterality: Right;    ALLERGIES:  is allergic to byetta 10 mcg pen; hctz; plavix; and penicillins.  MEDICATIONS:  Current Facility-Administered Medications  Medication Dose Route Frequency Provider Last Rate Last Dose  . 0.9 %  sodium chloride infusion   Intravenous Continuous Colleen Can, MD 50 mL/hr at 04/26/15 1828    . acetaminophen (TYLENOL) tablet 650 mg  650 mg Oral Q6H PRN Loletha Grayer, MD      .  budesonide (PULMICORT) nebulizer solution 0.25 mg  0.25 mg Nebulization BID Loletha Grayer, MD   0.25 mg at 04/26/15 1418  . ipratropium-albuterol (DUONEB) 0.5-2.5 (3) MG/3ML nebulizer solution 3 mL  3 mL Nebulization Q6H Colleen Can, MD      . ipratropium-albuterol (DUONEB) 0.5-2.5 (3) MG/3ML nebulizer solution 3 mL  3 mL Nebulization Q2H PRN Colleen Can, MD      . levofloxacin (LEVAQUIN) IVPB 750 mg  750 mg Intravenous Q24H Daymon Larsen, MD 100 mL/hr at 04/26/15 1329 750 mg at 04/26/15 1329  . methylPREDNISolone sodium succinate (SOLU-MEDROL) 125 mg/2 mL injection 60 mg  60 mg Intravenous Q6H Loletha Grayer, MD   60 mg at 04/26/15 1416  . pantoprazole (PROTONIX) EC tablet 40 mg  40 mg Oral Daily Loletha Grayer, MD   40 mg at 04/26/15 1623   Facility-Administered Medications Ordered in Other Encounters  Medication Dose Route Frequency Provider Last Rate Last Dose  . 0.9 %  sodium chloride infusion   Intravenous Once Evlyn Kanner, NP        Vital Signs: BP 98/55 mmHg  Pulse 100  Temp(Src) 98.1 F (36.7 C) (Oral)  Resp 28  Ht '5\' 6"'$  (1.676 m)  Wt 117.845 kg (259 lb 12.8 oz)  BMI 41.95 kg/m2  SpO2 87% Filed Weights   04/26/15 1145 04/26/15 1542  Weight: 113.853 kg (251 lb) 117.845 kg (259 lb 12.8 oz)    Estimated body mass index is 41.95 kg/(m^2) as calculated from the following:   Height as of this encounter: '5\' 6"'$  (1.676 m).   Weight as of this encounter: 117.845 kg (259 lb 12.8 oz).  PERFORMANCE STATUS (ECOG) : 3 - Symptomatic, >50% confined to bed  PHYSICAL EXAM: Lying in a medical bed --a bit visibly dyspneic on 5 LPM O2 [place] eomi Alert and fully oriented NO JVD or TM Hrt rrr no m Lungs with decreased BS bases Abd soft and NT Ext no  cyanosis or mottling as yet  LABS: CBC:    Component Value Date/Time   WBC 10.6 04/26/2015 1216   WBC 9.0 06/15/2014 0855   HGB 10.4* 04/26/2015 1216   HCT 32.2* 04/26/2015 1216   HCT 39.9 06/15/2014 0855   PLT  334 04/26/2015 1216   PLT 261 06/15/2014 0855   MCV 83.5 04/26/2015 1216   MCV 86 06/15/2014 0855   NEUTROABS 8.9* 04/26/2015 1216   NEUTROABS 6.5 06/15/2014 0855   LYMPHSABS 0.4* 04/26/2015 1216   LYMPHSABS 1.8 06/15/2014 0855   MONOABS 1.2* 04/26/2015 1216   EOSABS 0.1 04/26/2015 1216   EOSABS 0.1 06/15/2014 0855   BASOSABS 0.0 04/26/2015 1216   BASOSABS 0.0 06/15/2014 0855   Comprehensive Metabolic Panel:    Component Value Date/Time   NA 139 04/26/2015 1216   NA 140 06/15/2014 0855   K 4.4 04/26/2015 1216   CL 97* 04/26/2015 1216   CO2 33* 04/26/2015 1216   BUN 9 04/26/2015 1216   BUN 13 06/15/2014 0855   CREATININE 0.45 04/26/2015 1216   GLUCOSE 103* 04/26/2015 1216   GLUCOSE 150* 06/15/2014 0855   CALCIUM 12.7* 04/26/2015 1216   AST 20 04/26/2015 1216   ALT 17 04/26/2015 1216   ALKPHOS 76 04/26/2015 1216   BILITOT 0.7 04/26/2015 1216   BILITOT 0.6 06/15/2014 0855   PROT 7.0 04/26/2015 1216   PROT 6.2 06/15/2014 0855   ALBUMIN 2.8* 04/26/2015 1216   ALBUMIN 4.2 06/15/2014 0855      PET scan 01/31/15: 1. Today's study again demonstrates a large hypermetabolic left lower lobe mass and small hypermetabolic nodule in the central aspect of the superior segment of the right lower lobe. This is associated with left hilar and bilateral mediastinal lymphadenopathy, as well as a malignant right supraclavicular lymph node. No other definite metastatic disease noted elsewhere in the abdomen or pelvis. 2. Small area of hypermetabolism in the left side of the T11 vertebral body, without definite corresponding osseous abnormality. Close attention on followup studies is recommended to exclude the possibility of metastatic disease to the bones. Small hypermetabolic area in superior endplate of L1 corresponds to a Schmorl's node. 3. Colonic diverticulosis without evidence of acute diverticulitis at this time. 4. Additional incidental findings, as above.      CT angio  chest 4/25: No pulmonary embolus is identified. Lymphadenopathy in the chest has worsened since the prior examination. A 2.0 cm left paratracheal lymph node on image 41 measured 1.4 cm on the prior study. AP window lymph node on image 36 measures 1.7 cm compared to 1.4 cm. Subcarinal lymph node measures 2.0 cm on image 55 compared to 1.4 cm. Left epicardial node on image 56 measures 1.6 cm compared to 0.9 cm. There has been some increase in a small to moderate left pleural effusion. The pleural fluid collection is predominantly posterior to left upper lobe. New very small right pleural effusion is noted. No pericardial effusion. Heart size is enlarged. The lungs are emphysematous. Mass lesion posterior to the right mainstem bronchus which had measured 3.3 x 3.0 cm today measures 4.2 x 3.8 cm on image 56. The lesion has some mass effect on the bronchus but does not completely compress it. The left lower lobe mass lesion seen on the prior study cannot be distinguished from drowned lung with today's examination. Prominent pulmonary interstitium and peribronchovascular nodularity in the left lower lobe are consistent with lymphangitic tumor spread and worse than on the prior exam Incidentally imaged upper abdomen  demonstrates no focal abnormality. No focal bony abnormality is seen. Review of the MIP images confirms the above findings. IMPRESSION: Negative for pulmonary embolus. Progressive metastatic lung carcinoma. Some increase in a small to moderate left pleural effusion which is predominantly posterior to the upper lobe.   More than 50% of the visit was spent in counseling/coordination of care: Yes  Time Spent: 80 minutes

## 2015-04-26 NOTE — Progress Notes (Signed)
[place] OFFICE PROGRESS NOTE  Patient Care Team: Arnetha Courser, MD as PCP - General (Family Medicine) Arnetha Courser, MD (Family Medicine)   SUMMARY OF ONCOLOGIC HISTORY:  # July of 2016-  squamous cell carcinoma stage IV lung cancer T3 N2 M1 disease (lung mass in the left lower lobe) and right upper lobe and mediastinal lymphadenopathy.  Status post radiation therapy but patient did not have any significant amount of chemotherapy only one dose of carboplatinum has patient declined chemotherapy along with radiation treatment because of side effect of radiation therapy Diagnosis in  2. Progressive disease in the left lower lobe on recent CT scan of November 2 016 Patient is starting carboplatinum and Abraxane (November, 2016) 3. patient has finished 4 cycles of carboplatinum and Taxol in January of 2017 4.Repeat CT scan and PET scan shows progressive disease with possible new onset of bone metastases since spine 5.Hypercalcemia and progressive disease based on symptoms We will start TECENTRIQ   INTERVAL HISTORY:  This is my first interaction with the patient - as the patient has seen Dr. Wende Bushy. Grayland Ormond in the past. I reviewed the patient's prior charts/pertinent labs/imaging in detail; findings are summarized above.   75 year old female patient with above history of stage IV squamous cell carcinoma currently on palliative immunotherapy with Tecentriq status post cycle #1 is here for follow-up.   As per the son patient was recently treated for a UTI with Levaquin. However she has been urinating frequently.  As per the son, patient has been feeling weak over the last many days. She is more short of breath is coughing. Patient is on 3 lits of oxygen. No fevers. Overall feels poorly. Poor appetite. Positive for Weight loss. Feels fatigued. Positive for constipation. Nausea but no vomiting. Complains of neck swelling.  The son felt that chemotherapy/immunotherapy has  making his mother weaker. He has been advocating against any further therapy for his mother   REVIEW OF SYSTEMS:  A complete 10 point review of system is done which is negative except mentioned above/history of present illness.   PAST MEDICAL HISTORY :  Past Medical History  Diagnosis Date  . Stroke ([place])   . Hyperlipidemia   . Lung cancer ([place])   . Hypertension   . COPD (chronic obstructive pulmonary disease) ([place])   . Shortness of breath dyspnea   . Diabetes mellitus without complication ([place])   . Depression   . Anxiety   . Headache   . Hypomagnesemia 03/17/2015  . Urinary incontinence     PAST SURGICAL HISTORY :   Past Surgical History  Procedure Laterality Date  . Breast surgery Left cyst removed  . Cardiac catheterization    . Colonoscopy    . Endobronchial ultrasound Right 07/08/2014    Procedure: ENDOBRONCHIAL ULTRASOUND;  Surgeon: Vilinda Boehringer, MD;  Location: [facility] ORS;  Service: Cardiopulmonary;  Laterality: Right;    FAMILY HISTORY :   Family History  Problem Relation Age of Onset  . Heart disease Mother   . Hypertension Mother   . Congestive Heart Failure Mother   . Heart disease Father   . Stroke Father   . Cancer Father     colon  . Cancer Maternal Uncle     lung  . Lung disease Maternal Grandmother   . Alcohol abuse Maternal Grandfather   . Cancer Paternal Grandmother   . Stroke Paternal Grandmother     SOCIAL HISTORY:   Social History  Substance Use Topics  .  Smoking status: Former Smoker -- 2.00 packs/day    Types: Cigarettes    Quit date: 06/14/2004  . Smokeless tobacco: Never Used  . Alcohol Use: No    ALLERGIES:  is allergic to byetta 10 mcg pen; hctz; penicillins; plavix; and plavix.  MEDICATIONS:  No current facility-administered medications for this visit.   Current Outpatient Prescriptions  Medication Sig Dispense Refill  . albuterol (PROVENTIL HFA;VENTOLIN HFA) 108 (90 Base) MCG/ACT inhaler Inhale 2 puffs into the lungs every 4  (four) hours as needed for wheezing or shortness of breath. 1 Inhaler 2  . amLODipine (NORVASC) 5 MG tablet TAKE 1 TABLET BY MOUTH EVERY DAY 30 tablet 11  . B Complex Vitamins (B COMPLEX PO) Take 1 tablet by mouth every morning.    . benazepril (LOTENSIN) 20 MG tablet Take 1 tablet (20 mg total) by mouth daily. 90 tablet 0  . Cholecalciferol (VITAMIN D3) 2000 UNITS TABS Take 1 tablet by mouth every morning.    Marland Kitchen co-enzyme Q-10 30 MG capsule Take 30 mg by mouth daily.    Marland Kitchen gabapentin (NEURONTIN) 300 MG capsule Take 1 capsule (300 mg total) by mouth 2 (two) times daily. 60 capsule 0  . glimepiride (AMARYL) 4 MG tablet TAKE 1 TABLET BY MOUTH EVERY DAY 30 tablet 2  . glucose blood test strip OneTouch Ultra test strips; check glucose 3x a day; LON 99 months; E11.65 100 each 11  . glucose monitoring kit (FREESTYLE) monitoring kit 1 each by Does not apply route as needed for other.    Marland Kitchen MAG64 535 (64 Mg) MG TBCR TAKE 1 TABLET (64 MG TOTAL) BY MOUTH 2 (TWO) TIMES DAILY. 60 tablet 0  . metFORMIN (GLUCOPHAGE-XR) 500 MG 24 hr tablet TAKE 2 TABLETS BY MOUTH TWICE A DAY 360 tablet 0  . Multiple Vitamins-Minerals (MULTIVITAMIN PO) Take 1 tablet by mouth 2 (two) times daily.    . Omega-3 Fatty Acids (FISH OIL PO) Take 1,000 mg by mouth every morning.    . ondansetron (ZOFRAN) 4 MG tablet Take 1 tablet (4 mg total) by mouth every 6 (six) hours as needed for nausea or vomiting. 30 tablet 3  . pravastatin (PRAVACHOL) 40 MG tablet Take 1 tablet (40 mg total) by mouth at bedtime. 30 tablet 2  . valACYclovir (VALTREX) 1000 MG tablet      Facility-Administered Medications Ordered in Other Visits  Medication Dose Route Frequency Provider Last Rate Last Dose  . 0.9 %  sodium chloride infusion   Intravenous Once Evlyn Kanner, NP        PHYSICAL EXAMINATION: ECOG PERFORMANCE STATUS: 3 - Symptomatic, >50% confined to bed  BP 117/78 mmHg  Pulse 114  Temp(Src) 98.6 F (37 C) (Tympanic)  SpO2 82%  There were  no vitals filed for this visit.  GENERAL: Sick-appearing Caucasian female patient ; she is in a wheelchair. Mild to moderate respiratory distress. Accompanied by her son. EYES: no pallor or icterus OROPHARYNX: no thrush or ulceration;  NECK: supple, no masses felt LYMPH:  no palpable lymphadenopathy in the cervical, axillary or inguinal regions LUNGS: Decreased breath sounds bilaterally left more than right crackles and wheezing present. HEART/CVS: regular rate & rhythm and no murmurs; 1+ edema bilateral lower extremity is. ABDOMEN:abdomen soft, non-tender and normal bowel sounds; limited exam as patient is wheelchair. Musculoskeletal:no cyanosis of digits and no clubbing  PSYCH: alert & oriented x 3 with fluent speech NEURO: no focal motor/sensory deficits SKIN:  no rashes or significant lesions  LABORATORY DATA:  I have reviewed the data as listed    Component Value Date/Time   NA 137 04/26/2015 0952   NA 140 06/15/2014 0855   K 4.6 04/26/2015 0952   CL 98* 04/26/2015 0952   CO2 34* 04/26/2015 0952   GLUCOSE 116* 04/26/2015 0952   GLUCOSE 150* 06/15/2014 0855   BUN 9 04/26/2015 0952   BUN 13 06/15/2014 0855   CREATININE 0.49 04/26/2015 0952   CALCIUM 12.2* 04/26/2015 0952   PROT 7.1 04/26/2015 0952   PROT 6.2 06/15/2014 0855   ALBUMIN 3.0* 04/26/2015 0952   ALBUMIN 4.2 06/15/2014 0855   AST 21 04/26/2015 0952   ALT 18 04/26/2015 0952   ALKPHOS 80 04/26/2015 0952   BILITOT 0.6 04/26/2015 0952   BILITOT 0.6 06/15/2014 0855   GFRNONAA >60 04/26/2015 0952   GFRAA >60 04/26/2015 0952    No results found for: SPEP, UPEP  Lab Results  Component Value Date   WBC 10.4 04/26/2015   NEUTROABS 9.0* 04/26/2015   HGB 10.9* 04/26/2015   HCT 34.2* 04/26/2015   MCV 84.4 04/26/2015   PLT 356 04/26/2015      Chemistry      Component Value Date/Time   NA 137 04/26/2015 0952   NA 140 06/15/2014 0855   K 4.6 04/26/2015 0952   CL 98* 04/26/2015 0952   CO2 34* 04/26/2015 0952    BUN 9 04/26/2015 0952   BUN 13 06/15/2014 0855   CREATININE 0.49 04/26/2015 0952      Component Value Date/Time   CALCIUM 12.2* 04/26/2015 0952   ALKPHOS 80 04/26/2015 0952   AST 21 04/26/2015 0952   ALT 18 04/26/2015 0952   BILITOT 0.6 04/26/2015 0952   BILITOT 0.6 06/15/2014 0855         ASSESSMENT & PLAN:   # Squamous cell lung cancer metastatic- currently on second line therapy with Tecentriq; status post 1 cycle. Hold treatment today [C discussion below].  # Shortness of breath/extreme fatigue/hypoxia- I suspect patient has progressive disease with progressive symptoms. I would recommend evaluation the emergency room ASAP. I suspect patient has worsening pleural effusion. Patient will likely need chest x-ray/CAT scan for further evaluation.  # Hypercalcemia- malignancy related. Calcium on 12. Symptomatic. Currently on Zometa.  # Poor performance status/significant decline over the last few months- discussed with the patient/son- palliative treatments offer benefit in terms of maintenance of quality of life/survival- only if they have good performance status. In poor performance status the treatments per patient as a high risk for side effects/poor tolerance. I also discussed about hospice/palliative care alone with the patient and son. Son seems to be distraught. For now patient will be taken to the hospital for further evaluation. If the patient is admitted- I would recommend palliative care consultation.   # 40 minutes face-to-face with the patient discussing the above plan of care; more than 50% of time spent on prognosis/ natural history; counseling and coordination.    Cammie Sickle, MD 04/26/2015 12:36 PM

## 2015-04-26 NOTE — Telephone Encounter (Signed)
Form faxed back.

## 2015-04-26 NOTE — Progress Notes (Signed)

## 2015-04-26 NOTE — Progress Notes (Signed)
Pt with stage 4 lung cancer here for tencentriq tx today. She has a clinical h/o chemotherapy. She has only had 1 cycle of Tencentriq infusion.  Pt hypoxic on arrival to clinic-Sats 78-80% RA. Pt c/o shortness of breath, wheezing, productive yellow-thick mucus and cough and intermittent episodes of confusion. H/o copd. Crackles/rhonchi heard throughout all fields. Pt normally uses 2L of oxygen at home. Pt placed on 3L of oxygen per md order. No improvement in saturations. Persistent shortness of breath. Pt's oxygen moved up to 4L. sats improved to 90%.   Pt's son at bedside.  Son is main historian. He states that patient recently dx with UTI. Pt was prescribed Levaquin. Pt still c/o burning with urination/'foul smell in urine."  Due to pt's hypoxia, Tencentriq will be held. Pt reluctant intially to go to ED via EMS. She asked that her son transport her to the ED without oxygen tank, against medical advise. We explained that due the hypoxia, this is not advisable! RN and MD explained and advised pt and her son of risk factors of not having oxygen during transport (including the risk for cardiovascular/resp. distress.and syncope.)  Teach back process performed with patient and her son.  Pt verbally agreed to transport to ED via EMS.  Per md order, pt will be transported to ER via ems. RN contacted and gave handoff report to 911 also contacted charge nurse in ED.  EMT/911/and fire department at bedside.  Handoff provided to EMT. Pt alert/oriented. Pt and son verbally agreeable for EMT transport.

## 2015-04-27 NOTE — Progress Notes (Signed)
Palliative Medicine Inpatient Consult Follow Up Note   Name: Sherry Mueller Date: 04/27/2015 MRN: [id number]  DOB: May 18, 1940  Referring Physician: Bettey Costa, MD  Palliative Care consult requested for this 75 y.o. female for goals of medical therapy in patient with terminal phase metastatic lung cancer.  PLAN: I am informed that pt's son has selected [facility] for Hospice in the Home.  I have already completed the DC med recs and I have spoken with Dr. Benjie Karvonen who will do a DC summary. I have spoken with the care manager and the nurse caring for pt.  Pt needs 6 LPM --refuses face mask and this am refused any more morphine than the 2.6 mg dose that had been ordered routinely. Family and pt will need the kind of support and education that Hospice agencies can provide about morphine helping with air hunger and helping to keep pts at home. This pt has refused any kind of facility placement including any kind of inpatient hospice such as hospice home. She ONLY wants to go home and wants to stay home.  Will update Dr. Janeth Rase.  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  Talks but in short sentences.  CODE STATUS: DNR   PAST MEDICAL HISTORY: Past Medical History  Diagnosis Date  . Stroke ([place])   . Hyperlipidemia   . Lung cancer ([place])   . Hypertension   . COPD (chronic obstructive pulmonary disease) ([place])   . Shortness of breath dyspnea   . Diabetes mellitus without complication ([place])   . Depression   . Anxiety   . Headache   . Hypomagnesemia 03/17/2015  . Urinary incontinence   . Asthma     PAST SURGICAL HISTORY:  Past Surgical History  Procedure Laterality Date  . Breast surgery Left  cyst removed  . Cardiac catheterization    . Colonoscopy    . Endobronchial ultrasound Right 07/08/2014    Procedure: ENDOBRONCHIAL ULTRASOUND;  Surgeon: Vilinda Boehringer, MD;  Location: [facility] ORS;  Service: Cardiopulmonary;  Laterality: Right;    Vital Signs: BP 129/49 mmHg  Pulse 103  Temp(Src) 97.8 F (36.6 C) (Oral)  Resp 20  Ht '5\' 6"'$  (1.676 m)  Wt 117.845 kg (259 lb 12.8 oz)  BMI 41.95 kg/m2  SpO2 90% Filed Weights   04/26/15 1145 04/26/15 1542  Weight: 113.853 kg (251 lb) 117.845 kg (259 lb 12.8 oz)    Estimated body mass index is 41.95 kg/(m^2) as calculated from the following:   Height as of this encounter: '5\' 6"'$  (1.676 m).   Weight as of this encounter: 117.845 kg (259 lb 12.8 oz).  PHYSICAL EXAM: Lying on her side on  6 LPM O2 nasal cannula. Able to speak w/o conversational dyspnea despite sats in the mid to high 80's. Says she is going home today 'no matter what'. No JVD or Tm Hrt rrr no m Lungs with decreased BS Abd soft and NT and obese Ext no mottling or cyanosis as yet.  LABS: CBC:    Component Value Date/Time   WBC 10.6 04/26/2015 1216   WBC 9.0 06/15/2014 0855   HGB 10.4* 04/26/2015 1216   HCT 32.2* 04/26/2015 1216   HCT 39.9 06/15/2014 0855  PLT 334 04/26/2015 1216   PLT 261 06/15/2014 0855   MCV 83.5 04/26/2015 1216   MCV 86 06/15/2014 0855   NEUTROABS 8.9* 04/26/2015 1216   NEUTROABS 6.5 06/15/2014 0855   LYMPHSABS 0.4* 04/26/2015 1216   LYMPHSABS 1.8 06/15/2014 0855   MONOABS 1.2* 04/26/2015 1216   EOSABS 0.1 04/26/2015 1216   EOSABS 0.1 06/15/2014 0855   BASOSABS 0.0 04/26/2015 1216   BASOSABS 0.0 06/15/2014 0855   Comprehensive Metabolic Panel:    Component Value Date/Time   NA 139 04/26/2015 1216   NA 140 06/15/2014 0855   K 4.4 04/26/2015 1216   CL 97* 04/26/2015 1216   CO2 33* 04/26/2015 1216   BUN 9 04/26/2015 1216   BUN 13 06/15/2014 0855   CREATININE 0.45 04/26/2015 1216   GLUCOSE 103* 04/26/2015 1216   GLUCOSE 150*  06/15/2014 0855   CALCIUM 12.7* 04/26/2015 1216   AST 20 04/26/2015 1216   ALT 17 04/26/2015 1216   ALKPHOS 76 04/26/2015 1216   BILITOT 0.7 04/26/2015 1216   BILITOT 0.6 06/15/2014 0855   PROT 7.0 04/26/2015 1216   PROT 6.2 06/15/2014 0855   ALBUMIN 2.8* 04/26/2015 1216   ALBUMIN 4.2 06/15/2014 0855      More than 50% of the visit was spent in counseling/coordination of care: YES  Time Spent: 25 min

## 2015-04-27 NOTE — Discharge Summary (Signed)
[facility] NAME: Sherry Mueller    MR#:  [id number]  DATE OF BIRTH:  December 04, 1940  DATE OF ADMISSION:  04/26/2015 ADMITTING PHYSICIAN: Loletha Grayer, MD  DATE OF DISCHARGE: 04/27/2015  PRIMARY CARE PHYSICIAN: Enid Derry, MD    ADMISSION DIAGNOSIS:  Shortness of breath [R06.02] Postobstructive pneumonia [J18.9]  DISCHARGE DIAGNOSIS:  Active Problems:   Acute on chronic respiratory failure ([place])   SECONDARY DIAGNOSIS:   Past Medical History  Diagnosis Date  . Stroke ([place])   . Hyperlipidemia   . Lung cancer ([place])   . Hypertension   . COPD (chronic obstructive pulmonary disease) ([place])   . Shortness of breath dyspnea   . Diabetes mellitus without complication ([place])   . Depression   . Anxiety   . Headache   . Hypomagnesemia 03/17/2015  . Urinary incontinence   . Asthma     HOSPITAL COURSE:   75 year old female with a history of metastatic lung cancer presented with shortness of breath. For further details please refer the H&P.  1. Acute on chronic hypoxic respiratory failure due to postobstructive pneumonia: She underwent as chest CT which was negative for pulmonary emboli. It does show progressive metastatic lung cancer. Patient was seen and evaluated by palliative care. Patient did not want aggressive management. She was started on Levaquin.  2. Postobstructive pneumonia: Patient will continue Levaquin at discharge.  3. Metastatic small cell carcinoma stage IV: Patient has very poor prognosis. Palliative care consult was placed. Patient will be discharged with home hospice.  4. Hyperlipidemia: She was continued on pravastatin. At discharge family would like limited medications.  5. Essential hypertension: She was on Norvasc 6. Type 2 diabetes: She was on sliding scale insulin and on glimepiride   DISCHARGE CONDITIONS AND DIET:   Patient is in guarded condition and will be discharged home with hospice on a regular  diet  CONSULTS OBTAINED:     DRUG ALLERGIES:   Allergies  Allergen Reactions  . Byetta 10 Mcg Pen [Exenatide] Nausea And Vomiting  . Hctz [Hydrochlorothiazide] Nausea And Vomiting  . Plavix [Clopidogrel] Nausea And Vomiting  . Penicillins Itching, Rash and Other (See Comments)    Has patient had a PCN reaction causing immediate rash, facial/tongue/throat swelling, SOB or lightheadedness with hypotension: No Has patient had a PCN reaction causing severe rash involving mucus membranes or skin necrosis: No Has patient had a PCN reaction that required hospitalization No Has patient had a PCN reaction occurring within the last 10 years: No If all of the above answers are "NO", then may proceed with Cephalosporin use.    DISCHARGE MEDICATIONS:   Current Discharge Medication List    START taking these medications   Details  acetaminophen (TYLENOL) 325 MG tablet Take 2 tablets (650 mg total) by mouth every 6 (six) hours as needed for mild pain or fever.    bisacodyl (DULCOLAX) 10 MG suppository Place 1 suppository (10 mg total) rectally as needed for moderate constipation. Qty: 6 suppository, Refills: 0    !! ipratropium-albuterol (DUONEB) 0.5-2.5 (3) MG/3ML SOLN Take 3 mLs by nebulization every 6 (six) hours. Qty: 60 mL, Refills: 0    !! ipratropium-albuterol (DUONEB) 0.5-2.5 (3) MG/3ML SOLN Take 3 mLs by nebulization every 2 (two) hours as needed (shortness of breath or wheezing). Qty: 120 mL, Refills: 0    levofloxacin (LEVAQUIN) 500 MG tablet Take 1 tablet (500 mg total) by mouth daily. Qty: 6 tablet, Refills: 0  LORazepam (ATIVAN) 0.5 MG tablet Take 1 tablet (0.5 mg total) by mouth every 4 (four) hours as needed. Qty: 15 tablet, Refills: 0    Morphine Sulfate (MORPHINE CONCENTRATE) 10 MG/0.5ML SOLN concentrated solution Take 0.25 mLs (5 mg total) by mouth every hour as needed for moderate pain, severe pain or shortness of breath. Qty: 30 mL, Refills: 0    prochlorperazine  (COMPAZINE) 25 MG suppository Place 1 suppository (25 mg total) rectally every 12 (twelve) hours as needed for nausea or vomiting. Qty: 6 suppository, Refills: 0    senna (SENOKOT) 8.6 MG TABS tablet Take 2 tablets (17.2 mg total) by mouth at bedtime. Qty: 30 each, Refills: 0     !! - Potential duplicate medications found. Please discuss with provider.    CONTINUE these medications which have NOT CHANGED   Details  lansoprazole (PREVACID) 30 MG capsule Take 30 mg by mouth daily at 12 noon.      STOP taking these medications     albuterol (PROVENTIL HFA;VENTOLIN HFA) 108 (90 Base) MCG/ACT inhaler      amLODipine (NORVASC) 5 MG tablet      b complex vitamins tablet      benazepril (LOTENSIN) 20 MG tablet      cholecalciferol (VITAMIN D) 1000 units tablet      citalopram (CELEXA) 40 MG tablet      co-enzyme Q-10 30 MG capsule      gabapentin (NEURONTIN) 300 MG capsule      glimepiride (AMARYL) 4 MG tablet      isosorbide mononitrate (IMDUR) 30 MG 24 hr tablet      magnesium chloride (SLOW-MAG) 64 MG TBEC SR tablet      metFORMIN (GLUCOPHAGE-XR) 500 MG 24 hr tablet      Multiple Vitamin (MULTIVITAMIN WITH MINERALS) TABS tablet      omega-3 acid ethyl esters (LOVAZA) 1 g capsule      ondansetron (ZOFRAN) 4 MG tablet      pravastatin (PRAVACHOL) 40 MG tablet               Today   CHIEF COMPLAINT:   No acute issues overnight. Patient is on 6 L of oxygen. She has some shortness of breath. She would like to go home.   VITAL SIGNS:  Blood pressure 129/49, pulse 103, temperature 97.8 F (36.6 C), temperature source Oral, resp. rate 20, height '5\' 6"'$  (1.676 m), weight 117.845 kg (259 lb 12.8 oz), SpO2 90 %.   REVIEW OF SYSTEMS:  Review of Systems  Constitutional: Negative for fever, chills and malaise/fatigue.  HENT: Negative for ear discharge, ear pain, hearing loss, nosebleeds and sore throat.   Eyes: Negative for blurred vision and pain.  Respiratory:  Positive for cough and shortness of breath. Negative for hemoptysis and wheezing.   Cardiovascular: Negative for chest pain, palpitations and leg swelling.  Gastrointestinal: Negative for nausea, vomiting, abdominal pain, diarrhea and blood in stool.  Genitourinary: Negative for dysuria.  Musculoskeletal: Negative for back pain.  Neurological: Positive for weakness. Negative for dizziness, tremors, speech change, focal weakness, seizures and headaches.  Endo/Heme/Allergies: Does not bruise/bleed easily.  Psychiatric/Behavioral: Negative for depression, suicidal ideas and hallucinations.     PHYSICAL EXAMINATION:  GENERAL:  74 y.o.-year-old patient lying in the bed with no acute distress.  NECK:  Supple, no jugular venous distention. No thyroid enlargement, no tenderness.  LUNGS: Decreased breath sounds with crackles at the left base no  wheezing, rales,rhonchi  No use of accessory muscles of  respiration.  CARDIOVASCULAR: S1, S2 normal. No murmurs, rubs, or gallops.  ABDOMEN: Soft, non-tender, non-distended. Bowel sounds present. No organomegaly or mass.  EXTREMITIES: No pedal edema, cyanosis, or clubbing.  PSYCHIATRIC: The patient is alert and oriented x 3.  SKIN: No obvious rash, lesion, or ulcer.   DATA REVIEW:   CBC  Recent Labs Lab 04/26/15 1216  WBC 10.6  HGB 10.4*  HCT 32.2*  PLT 334    Chemistries   Recent Labs Lab 04/26/15 0952 04/26/15 1216  NA 137 139  K 4.6 4.4  CL 98* 97*  CO2 34* 33*  GLUCOSE 116* 103*  BUN 9 9  CREATININE 0.49 0.45  CALCIUM 12.2* 12.7*  MG 1.5*  --   AST 21 20  ALT 18 17  ALKPHOS 80 76  BILITOT 0.6 0.7    Cardiac Enzymes  Recent Labs Lab 04/26/15 1216  TROPONINI <0.03    Microbiology Results  '@MICRORSLT48'$ @  RADIOLOGY:  Ct Angio Chest Pe W/cm &/or Wo Cm  04/26/2015  CLINICAL DATA:  History of stage IV lung carcinoma. Oxygen desaturation today. Shortness of breath and wheezing for 2 months. EXAM: CT ANGIOGRAPHY CHEST  WITH CONTRAST TECHNIQUE: Multidetector CT imaging of the chest was performed using the standard protocol during bolus administration of intravenous contrast. Multiplanar CT image reconstructions and MIPs were obtained to evaluate the vascular anatomy. CONTRAST:  100 cc Isovue 370. COMPARISON:  Plain film of the chest earlier today. CT chest 03/29/2015. FINDINGS: No pulmonary embolus is identified. Lymphadenopathy in the chest has worsened since the prior examination. A 2.0 cm left paratracheal lymph node on image 41 measured 1.4 cm on the prior study. AP window lymph node on image 36 measures 1.7 cm compared to 1.4 cm. Subcarinal lymph node measures 2.0 cm on image 55 compared to 1.4 cm. Left epicardial node on image 56 measures 1.6 cm compared to 0.9 cm. There has been some increase in a small to moderate left pleural effusion. The pleural fluid collection is predominantly posterior to left upper lobe. New very small right pleural effusion is noted. No pericardial effusion. Heart size is enlarged. The lungs are emphysematous. Mass lesion posterior to the right mainstem bronchus which had measured 3.3 x 3.0 cm today measures 4.2 x 3.8 cm on image 56. The lesion has some mass effect on the bronchus but does not completely compress it. The left lower lobe mass lesion seen on the prior study cannot be distinguished from drowned lung with today's examination. Prominent pulmonary interstitium and peribronchovascular nodularity in the left lower lobe are consistent with lymphangitic tumor spread and worse than on the prior exam. Incidentally imaged upper abdomen demonstrates no focal abnormality. No focal bony abnormality is seen. Review of the MIP images confirms the above findings. IMPRESSION: Negative for pulmonary embolus. Progressive metastatic lung carcinoma. Some increase in a small to moderate left pleural effusion which is predominantly posterior to the upper lobe. Electronically Signed   By: Inge Rise  M.D.   On: 04/26/2015 14:35   Dg Chest Port 1 View  04/26/2015  CLINICAL DATA:  Stage IV lung cancer. Hypoxemia today. Initial encounter. EXAM: PORTABLE CHEST 1 VIEW COMPARISON:  CT chest 03/29/2015.  PA and lateral chest 12/30/2013. FINDINGS: Bulky mediastinal lymphadenopathy with a left hilar and lower lobe mass are seen. There is opacity throughout the lower 1/2 of the left chest. Aeration is markedly worsening compared the prior CT. No consolidative process on the right is identified. The lungs are emphysematous heart  size is normal. IMPRESSION: Extensive opacity in the left mid and lower lung zones has worsened since the recent CT scan and is consistent with the patient's known left lower lobe mass. Increased airspace opacity may be due to postobstructive pneumonitis or pneumonia. Bulky mediastinal and hilar lymphadenopathy. Emphysema. Electronically Signed   By: Inge Rise M.D.   On: 04/26/2015 12:37      Management plans discussed with the patient and she is in agreement. Stable for discharge home with hospice  Patient should follow up with hospice  CODE STATUS:     Code Status Orders        Start     Ordered   04/26/15 1822  Do not attempt resuscitation (DNR)   Continuous    Question Answer Comment  In the event of cardiac or respiratory ARREST Do not call a "code blue"   In the event of cardiac or respiratory ARREST Do not perform Intubation, CPR, defibrillation or ACLS   In the event of cardiac or respiratory ARREST Use medication by any route, position, wound care, and other measures to relive pain and suffering. May use oxygen, suction and manual treatment of airway obstruction as needed for comfort.   Comments DNR and DNI.  NO HI FLOW and NO BIPAP.  Facemask O2 is OK if needed. Goal is comfort not cure.  Modified comfort care and nurse may pronounce.      04/26/15 1822    Code Status History    Date Active Date Inactive Code Status Order ID Comments User Context    04/26/2015  1:42 PM 04/26/2015  6:22 PM DNR [id number]  Loletha Grayer, MD ED      TOTAL TIME TAKING CARE OF THIS PATIENT: 35 minutes.    Note: This dictation was prepared with Dragon dictation along with smaller phrase technology. Any transcriptional errors that result from this process are unintentional.  Cashmere Harmes M.D on 04/27/2015 at 11:12 AM  Between 7am to 6pm - Pager - [phone] After 6pm go to [email or web address] - password [email or web address] [facility]  Office  [phone]  CC: Primary care physician; Enid Derry, MD

## 2015-04-27 NOTE — Care Management (Signed)
Son chose [facility]. Spoke with Otila Kluver at [facility]. Requested information faxed to [facility] for review. Will let us know if they are able to accept Ms. Sandberg. Discharge to home today per Dt Mody.   Son will transport Shelbie Ammons RN MSN [place] Management [phone]

## 2015-04-27 NOTE — Progress Notes (Signed)
Pt wheeled to the car with  Hospital oxygen tank. Per patient and son she will ride home without tank because they live close. Per their request.

## 2015-04-27 NOTE — Plan of Care (Signed)
Problem: Pain Managment: Goal: General experience of comfort will improve Outcome: Progressing Pt able to sleep with decreased respirations with oral morphine.  Problem: Education: Goal: Knowledge of the prescribed therapeutic regimen will improve Outcome: Progressing Son and pt instructed on use of morphine for control of air hunger/respirations.  Problem: Respiratory: Goal: Respiratory status will improve Outcome: Not Applicable Date Met:  53/91/22 End stage lung cancer

## 2015-04-27 NOTE — Care Management (Signed)
Presented to this facility with acute on chronic respiratory failure. Son, Sherry Mueller, lives with her. [phone]). Dr Sanda Klein is listed as primary care physician. States she has not been to that office in a while. Goes to the [facility] for care. States she will not be getting chemotherapy treatment anymore. Takes care of all basic activities of daily living herself. Son takes care of errands. Chronic O2 for several years. Usually on 2 litters. Doesn't remember name of agency. Rolling walker, hospital bed, and walk in shower in the home. No falls. Good appetite. Received referral for Hospice services in the home. Spoke with Ms. Severtson at the bedside. States she would like her son to make that decision.  Left voice mail for son to discuss agencies. Shelbie Ammons RN MSN [facility] Care Management [phone]

## 2015-04-27 NOTE — Progress Notes (Addendum)
Pt complains of shortness of breath while laying in bed.  Sitting up on edge of bed.  Pt received Morphine 2.6 mg oral and breathing tx.  Pt SPO2 at 83% on 6 L.  Pt declines use of mask.  Requests snack and given ice cream.  Pt able to talk and continues to sit up at edge of bed. Declines additional morphine at this time.  Will monitor closely. Dorna Bloom RN

## 2015-04-27 NOTE — Progress Notes (Signed)
Pt being discharged today, discharge instructions given to pt, she verified understanding.  Iv removed, belongings returned to patient. Pt rolled out in wheelchair by staff.

## 2015-05-01 LAB — CULTURE, BLOOD (ROUTINE X 2)
CULTURE: NO GROWTH
Culture: NO GROWTH

## 2015-05-18 ENCOUNTER — Inpatient Hospital Stay
Admission: EM | Admit: 2015-05-18 | Discharge: 2015-06-02 | DRG: 871 | Disposition: E | Payer: PPO | Attending: Pulmonary Disease | Admitting: Pulmonary Disease

## 2015-05-18 ENCOUNTER — Emergency Department: Payer: PPO

## 2015-05-18 ENCOUNTER — Encounter: Payer: Self-pay | Admitting: Emergency Medicine

## 2015-05-18 DIAGNOSIS — R6521 Severe sepsis with septic shock: Secondary | ICD-10-CM | POA: Diagnosis present

## 2015-05-18 DIAGNOSIS — Z66 Do not resuscitate: Secondary | ICD-10-CM | POA: Diagnosis present

## 2015-05-18 DIAGNOSIS — C799 Secondary malignant neoplasm of unspecified site: Secondary | ICD-10-CM | POA: Diagnosis present

## 2015-05-18 DIAGNOSIS — J90 Pleural effusion, not elsewhere classified: Secondary | ICD-10-CM | POA: Diagnosis present

## 2015-05-18 DIAGNOSIS — C3490 Malignant neoplasm of unspecified part of unspecified bronchus or lung: Secondary | ICD-10-CM | POA: Diagnosis present

## 2015-05-18 DIAGNOSIS — Z9221 Personal history of antineoplastic chemotherapy: Secondary | ICD-10-CM | POA: Diagnosis not present

## 2015-05-18 DIAGNOSIS — Z888 Allergy status to other drugs, medicaments and biological substances status: Secondary | ICD-10-CM

## 2015-05-18 DIAGNOSIS — C3492 Malignant neoplasm of unspecified part of left bronchus or lung: Secondary | ICD-10-CM

## 2015-05-18 DIAGNOSIS — G9340 Encephalopathy, unspecified: Secondary | ICD-10-CM | POA: Diagnosis present

## 2015-05-18 DIAGNOSIS — J9621 Acute and chronic respiratory failure with hypoxia: Secondary | ICD-10-CM

## 2015-05-18 DIAGNOSIS — Z6839 Body mass index (BMI) 39.0-39.9, adult: Secondary | ICD-10-CM

## 2015-05-18 DIAGNOSIS — E785 Hyperlipidemia, unspecified: Secondary | ICD-10-CM | POA: Diagnosis present

## 2015-05-18 DIAGNOSIS — Z8673 Personal history of transient ischemic attack (TIA), and cerebral infarction without residual deficits: Secondary | ICD-10-CM | POA: Diagnosis not present

## 2015-05-18 DIAGNOSIS — Z87891 Personal history of nicotine dependence: Secondary | ICD-10-CM | POA: Diagnosis not present

## 2015-05-18 DIAGNOSIS — D649 Anemia, unspecified: Secondary | ICD-10-CM | POA: Diagnosis present

## 2015-05-18 DIAGNOSIS — E861 Hypovolemia: Secondary | ICD-10-CM | POA: Diagnosis present

## 2015-05-18 DIAGNOSIS — J449 Chronic obstructive pulmonary disease, unspecified: Secondary | ICD-10-CM | POA: Diagnosis present

## 2015-05-18 DIAGNOSIS — E875 Hyperkalemia: Secondary | ICD-10-CM | POA: Diagnosis present

## 2015-05-18 DIAGNOSIS — J9622 Acute and chronic respiratory failure with hypercapnia: Secondary | ICD-10-CM | POA: Diagnosis present

## 2015-05-18 DIAGNOSIS — E119 Type 2 diabetes mellitus without complications: Secondary | ICD-10-CM | POA: Diagnosis present

## 2015-05-18 DIAGNOSIS — Z880 Allergy status to penicillin: Secondary | ICD-10-CM | POA: Diagnosis not present

## 2015-05-18 DIAGNOSIS — A419 Sepsis, unspecified organism: Secondary | ICD-10-CM | POA: Diagnosis present

## 2015-05-18 DIAGNOSIS — Z800 Family history of malignant neoplasm of digestive organs: Secondary | ICD-10-CM | POA: Diagnosis not present

## 2015-05-18 DIAGNOSIS — Z823 Family history of stroke: Secondary | ICD-10-CM | POA: Diagnosis not present

## 2015-05-18 DIAGNOSIS — R401 Stupor: Secondary | ICD-10-CM

## 2015-05-18 DIAGNOSIS — N179 Acute kidney failure, unspecified: Secondary | ICD-10-CM | POA: Diagnosis present

## 2015-05-18 DIAGNOSIS — E669 Obesity, unspecified: Secondary | ICD-10-CM | POA: Diagnosis present

## 2015-05-18 DIAGNOSIS — I10 Essential (primary) hypertension: Secondary | ICD-10-CM | POA: Diagnosis present

## 2015-05-18 DIAGNOSIS — Z8249 Family history of ischemic heart disease and other diseases of the circulatory system: Secondary | ICD-10-CM

## 2015-05-18 DIAGNOSIS — R109 Unspecified abdominal pain: Secondary | ICD-10-CM | POA: Diagnosis present

## 2015-05-18 DIAGNOSIS — N390 Urinary tract infection, site not specified: Secondary | ICD-10-CM | POA: Diagnosis present

## 2015-05-18 DIAGNOSIS — J9620 Acute and chronic respiratory failure, unspecified whether with hypoxia or hypercapnia: Secondary | ICD-10-CM | POA: Diagnosis present

## 2015-05-18 DIAGNOSIS — I959 Hypotension, unspecified: Secondary | ICD-10-CM | POA: Diagnosis present

## 2015-05-18 DIAGNOSIS — R1084 Generalized abdominal pain: Secondary | ICD-10-CM

## 2015-05-18 LAB — COMPREHENSIVE METABOLIC PANEL
ALK PHOS: 75 U/L (ref 38–126)
ALT: 515 U/L — AB (ref 14–54)
ANION GAP: 5 (ref 5–15)
AST: 662 U/L — ABNORMAL HIGH (ref 15–41)
Albumin: 2.1 g/dL — ABNORMAL LOW (ref 3.5–5.0)
BILIRUBIN TOTAL: 0.7 mg/dL (ref 0.3–1.2)
BUN: 71 mg/dL — ABNORMAL HIGH (ref 6–20)
CO2: 43 mmol/L — AB (ref 22–32)
CREATININE: 2.6 mg/dL — AB (ref 0.44–1.00)
Calcium: >15 mg/dL (ref 8.9–10.3)
Chloride: 89 mmol/L — ABNORMAL LOW (ref 101–111)
GFR calc non Af Amer: 17 mL/min — ABNORMAL LOW (ref >60)
GFR, EST AFRICAN AMERICAN: 20 mL/min — AB (ref >60)
Glucose, Bld: 121 mg/dL — ABNORMAL HIGH (ref 65–99)
Potassium: 5.3 mmol/L — ABNORMAL HIGH (ref 3.5–5.1)
SODIUM: 137 mmol/L (ref 135–145)
Total Protein: 5.4 g/dL — ABNORMAL LOW (ref 6.5–8.1)

## 2015-05-18 LAB — BLOOD GAS, ARTERIAL
ACID-BASE EXCESS: 16.8 mmol/L — AB (ref 0.0–3.0)
Allens test (pass/fail): POSITIVE — AB
BICARBONATE: 45.9 meq/L — AB (ref 21.0–28.0)
FIO2: 0.36
O2 Saturation: 87.1 %
PCO2 ART: 87 mmHg — AB (ref 32.0–48.0)
PH ART: 7.33 — AB (ref 7.350–7.450)
Patient temperature: 37
pO2, Arterial: 57 mmHg — ABNORMAL LOW (ref 83.0–108.0)

## 2015-05-18 LAB — CBC WITH DIFFERENTIAL/PLATELET
Basophils Absolute: 0 10*3/uL (ref 0–0.1)
Basophils Relative: 0 %
EOS ABS: 0 10*3/uL (ref 0–0.7)
Eosinophils Relative: 0 %
HEMATOCRIT: 28.4 % — AB (ref 35.0–47.0)
HEMOGLOBIN: 8.9 g/dL — AB (ref 12.0–16.0)
LYMPHS ABS: 0.2 10*3/uL — AB (ref 1.0–3.6)
MCH: 25.5 pg — AB (ref 26.0–34.0)
MCHC: 31.5 g/dL — AB (ref 32.0–36.0)
MCV: 81 fL (ref 80.0–100.0)
Monocytes Absolute: 0.5 10*3/uL (ref 0.2–0.9)
NEUTROS ABS: 11.4 10*3/uL — AB (ref 1.4–6.5)
Platelets: 251 10*3/uL (ref 150–440)
RBC: 3.5 MIL/uL — AB (ref 3.80–5.20)
RDW: 18.1 % — ABNORMAL HIGH (ref 11.5–14.5)
WBC: 12.2 10*3/uL — AB (ref 3.6–11.0)

## 2015-05-18 LAB — PROTIME-INR
INR: 1.24
Prothrombin Time: 15.8 seconds — ABNORMAL HIGH (ref 11.4–15.0)

## 2015-05-18 LAB — URINALYSIS COMPLETE WITH MICROSCOPIC (ARMC ONLY)
Bilirubin Urine: NEGATIVE
GLUCOSE, UA: NEGATIVE mg/dL
Ketones, ur: NEGATIVE mg/dL
Nitrite: NEGATIVE
PH: 5 (ref 5.0–8.0)
PROTEIN: 30 mg/dL — AB
Specific Gravity, Urine: 1.011 (ref 1.005–1.030)

## 2015-05-18 LAB — GLUCOSE, CAPILLARY: Glucose-Capillary: 127 mg/dL — ABNORMAL HIGH (ref 65–99)

## 2015-05-18 LAB — MRSA PCR SCREENING: MRSA BY PCR: NEGATIVE

## 2015-05-18 MED ORDER — SODIUM CHLORIDE 0.9 % IV SOLN: 250.0000 mL | INTRAVENOUS | Status: DC | PRN

## 2015-05-18 MED ORDER — ACETAMINOPHEN 10 MG/ML IV SOLN
1000.0000 mg | Freq: Four times a day (QID) | INTRAVENOUS | Status: AC
  Administered 2015-05-19: 1000 mg via INTRAVENOUS
  Filled 2015-05-18: qty 100

## 2015-05-18 MED ORDER — SODIUM CHLORIDE 0.9 % IV BOLUS (SEPSIS): 1000.0000 mL | Freq: Once | INTRAVENOUS | Status: DC

## 2015-05-18 MED ORDER — SODIUM CHLORIDE 0.9 % IV BOLUS (SEPSIS)
1000.0000 mL | Freq: Once | INTRAVENOUS | Status: AC
  Administered 2015-05-18: 1000 mL via INTRAVENOUS

## 2015-05-18 MED ORDER — NOREPINEPHRINE 4 MG/250ML-% IV SOLN
0.0000 ug/min | INTRAVENOUS | Status: DC
  Administered 2015-05-18: 24 ug/min via INTRAVENOUS
  Administered 2015-05-18: 15 ug/min via INTRAVENOUS
  Administered 2015-05-18: 7.5 ug/min via INTRAVENOUS
  Administered 2015-05-18: 5 ug/min via INTRAVENOUS
  Administered 2015-05-19 (×3): 40 ug/min via INTRAVENOUS
  Administered 2015-05-19: 36 ug/min via INTRAVENOUS
  Filled 2015-05-18 (×7): qty 250

## 2015-05-18 MED ORDER — NOREPINEPHRINE BITARTRATE 1 MG/ML IV SOLN: 0.0000 ug/min | Freq: Once | INTRAVENOUS | Status: DC

## 2015-05-18 MED ORDER — FENTANYL CITRATE (PF) 100 MCG/2ML IJ SOLN
25.0000 ug | INTRAMUSCULAR | Status: DC | PRN
  Administered 2015-05-19: 50 ug via INTRAVENOUS
  Filled 2015-05-18: qty 2

## 2015-05-18 MED ORDER — CIPROFLOXACIN IN D5W 400 MG/200ML IV SOLN
400.0000 mg | INTRAVENOUS | Status: DC
  Administered 2015-05-19: 400 mg via INTRAVENOUS
  Filled 2015-05-18 (×2): qty 200

## 2015-05-18 MED ORDER — SODIUM CHLORIDE 0.9 % IV BOLUS (SEPSIS)
500.0000 mL | Freq: Once | INTRAVENOUS | Status: AC
  Administered 2015-05-18: 500 mL via INTRAVENOUS

## 2015-05-18 MED ORDER — SODIUM CHLORIDE 0.9 % IV SOLN
INTRAVENOUS | Status: DC
  Administered 2015-05-18: 22:00:00 via INTRAVENOUS

## 2015-05-18 MED ORDER — PANTOPRAZOLE SODIUM 40 MG IV SOLR: 40.0000 mg | INTRAVENOUS | Status: DC

## 2015-05-18 MED ORDER — SODIUM CHLORIDE 0.9 % IV SOLN
90.0000 mg | Freq: Once | INTRAVENOUS | Status: AC
  Administered 2015-05-18: 90 mg via INTRAVENOUS
  Filled 2015-05-18: qty 10
  Filled 2015-05-18: qty 30

## 2015-05-18 MED ORDER — SODIUM CHLORIDE 0.9 % IV BOLUS (SEPSIS): 500.0000 mL | Freq: Once | INTRAVENOUS | Status: DC

## 2015-05-18 MED ORDER — DEXTROSE 5 % IV SOLN
2.0000 g | Freq: Once | INTRAVENOUS | Status: DC
Start: 2015-05-18 — End: 2015-05-18
  Administered 2015-05-18: 2 g via INTRAVENOUS
  Filled 2015-05-18: qty 2

## 2015-05-18 NOTE — ED Notes (Addendum)
Son in room with mother demanding that bipap be removed - discussed pt care and that MD Gilberto Better had requested that bipap be removed in the icu - son very upset that bipap was in place and then began to state that we had better not let his mother die and that we needed to do everything for her because he was her POA and that he never checked the box that said DNR - he said that someone was confused and that we needed to get it straightened out  - I called Dr Gilberto Better and he stated that 3 weeks ago he spoke with pt and she verbalized that she did not want to be resuscitated and that he had spent 30 minutes with son and that he had agreed on DNR status - MD states to transfer pt to ICU and have them to call him if any issues - MD also stated that pt was to remain a DNR and he would handle the consequences - charge nurse Seth Bake notified

## 2015-05-18 NOTE — ED Notes (Signed)
Increased Levophed to 26mg

## 2015-05-18 NOTE — Progress Notes (Signed)
ANTIBIOTIC CONSULT NOTE - INITIAL  Pharmacy Consult for Ciprofloxacin  Indication: UTI  Allergies  Allergen Reactions  . Byetta 10 Mcg Pen [Exenatide] Nausea And Vomiting  . Hctz [Hydrochlorothiazide] Nausea And Vomiting  . Plavix [Clopidogrel] Nausea And Vomiting  . Penicillins Itching, Rash and Other (See Comments)    Has patient had a PCN reaction causing immediate rash, facial/tongue/throat swelling, SOB or lightheadedness with hypotension: No Has patient had a PCN reaction causing severe rash involving mucus membranes or skin necrosis: No Has patient had a PCN reaction that required hospitalization No Has patient had a PCN reaction occurring within the last 10 years: No If all of the above answers are "NO", then may proceed with Cephalosporin use.    Patient Measurements: Height: '5\' 8"'$  (172.7 cm) Weight: 262 lb 12.6 oz (119.2 kg) IBW/kg (Calculated) : 63.9 Adjusted Body Weight:   Vital Signs: Temp: 97.7 F (36.5 C) (05/17 2100) Temp Source: Axillary (05/17 1600) BP: 95/33 mmHg (05/17 2000) Pulse Rate: 75 (05/17 1815) Intake/Output from previous day:   Intake/Output from this shift: Total I/O In: 150.4 [I.V.:150.4] Out: 250 [Urine:250]  Labs:  Recent Labs  05/13/2015 1637  WBC 12.2*  HGB 8.9*  PLT 251  CREATININE 2.60*   Estimated Creatinine Clearance: 26.6 mL/min (by C-G formula based on Cr of 2.6). No results for input(s): VANCOTROUGH, VANCOPEAK, VANCORANDOM, GENTTROUGH, GENTPEAK, GENTRANDOM, TOBRATROUGH, TOBRAPEAK, TOBRARND, AMIKACINPEAK, AMIKACINTROU, AMIKACIN in the last 72 hours.   Microbiology: Recent Results (from the past 720 hour(s))  Culture, blood (Routine X 2) w Reflex to ID Panel     Status: None   Collection Time: 04/26/15  1:20 PM  Result Value Ref Range Status   Specimen Description BLOOD LEFT ARM  Final   Special Requests BOTTLES DRAWN AEROBIC AND ANAEROBIC  10CC  Final   Culture NO GROWTH 5 DAYS  Final   Report Status 05/01/2015 FINAL   Final  Culture, blood (Routine X 2) w Reflex to ID Panel     Status: None   Collection Time: 04/26/15  1:20 PM  Result Value Ref Range Status   Specimen Description BLOOD RIGHT HAND  Final   Special Requests BOTTLES DRAWN AEROBIC AND ANAEROBIC 10CC  Final   Culture NO GROWTH 5 DAYS  Final   Report Status 05/01/2015 FINAL  Final    Medical History: Past Medical History  Diagnosis Date  . Stroke ([place])   . Hyperlipidemia   . Lung cancer ([place])   . Hypertension   . COPD (chronic obstructive pulmonary disease) ([place])   . Shortness of breath dyspnea   . Diabetes mellitus without complication ([place])   . Depression   . Anxiety   . Headache   . Hypomagnesemia 03/17/2015  . Urinary incontinence   . Asthma     Medications:  Prescriptions prior to admission  Medication Sig Dispense Refill Last Dose  . amLODipine (NORVASC) 5 MG tablet Take 5 mg by mouth daily.   unknown at unknown   . benazepril (LOTENSIN) 20 MG tablet Take 20 mg by mouth daily.   unknown at unknown   . citalopram (CELEXA) 40 MG tablet Take 40 mg by mouth at bedtime.   unknown at unknown   . doxycycline (VIBRAMYCIN) 100 MG capsule Take 100 mg by mouth 2 (two) times daily.   unknown at unknown   . glimepiride (AMARYL) 4 MG tablet Take 4 mg by mouth daily with breakfast.   unknown at unknown   . ibuprofen (ADVIL,MOTRIN) 200 MG  tablet Take 400 mg by mouth every 6 (six) hours as needed for fever or mild pain.   PRN at PRN  . ipratropium-albuterol (DUONEB) 0.5-2.5 (3) MG/3ML SOLN Take 3 mLs by nebulization every 2 (two) hours as needed (for wheezing/shortness of breath).   PRN at PRN  . isosorbide mononitrate (IMDUR) 30 MG 24 hr tablet Take 30 mg by mouth daily.   unknown at unknown   . LORazepam (ATIVAN) 0.5 MG tablet Take 0.5 mg by mouth every 4 (four) hours as needed for anxiety.   PRN at PRN  . metFORMIN (GLUCOPHAGE-XR) 500 MG 24 hr tablet Take 1,000 mg by mouth 2 (two) times daily with a meal.   unknown at unknown   .  Morphine Sulfate (MORPHINE CONCENTRATE) 10 MG/0.5ML SOLN concentrated solution Take 0.25 mLs (5 mg total) by mouth every hour as needed for moderate pain, severe pain or shortness of breath. 30 mL 0 PRN at PRN  . Multiple Vitamin (MULTIVITAMIN WITH MINERALS) TABS tablet Take 1 tablet by mouth daily.   unknown at unknown   . pravastatin (PRAVACHOL) 40 MG tablet Take 40 mg by mouth at bedtime.   unknown at unknown   . prochlorperazine (COMPAZINE) 25 MG suppository Place 25 mg rectally every 12 (twelve) hours as needed for nausea or vomiting.   PRN at PRN  . levofloxacin (LEVAQUIN) 500 MG tablet Take 1 tablet (500 mg total) by mouth daily. (Patient not taking: Reported on 05/12/2015) 6 tablet 0    Assessment: CrCl = 26.6 ml/min   Goal of Therapy:  Resolution of infection   Plan:  Expected duration 7 days with resolution of temperature and/or normalization of WBC   Ciprofloxacin 400 mg IV Q24H ordered to start 5/17 @ 22:00.   Derward Marple D 05/29/2015,9:33 PM

## 2015-05-18 NOTE — ED Provider Notes (Signed)
Time Seen: Approximately 1515  I have reviewed the triage notes  Chief Complaint: Shortness of Breath   History of Present Illness: Sherry Mueller is a 75 y.o. female who presents with an exacerbation of shortness of breath. Patient has a known history of metastatic lung cancer. She apparently is stage IV. Her son had her transported to the emergency department for altered mental status along with increased shortness of breath. The patient was transported here by EMS on a nonrebreather and was found to be hypotensive. The son states that she complained of some abdominal pain last night but otherwise is not aware of any fever, dysuria, hematuria. He states he has been constipated. The patient per record had a DO NOT RESUSCITATE but the patient's son states that he is the healthcare power of attorney and states he would like not to have a tracheostomy but otherwise patient is a full code. The patient hasn't had any chest pain. She has not had any back or flank pain. The patient herself is really unable to offer any significant history or review of systems. Patient is very lethargic and difficult to arouse at times.   Past Medical History  Diagnosis Date  . Stroke ([place])   . Hyperlipidemia   . Lung cancer ([place])   . Hypertension   . COPD (chronic obstructive pulmonary disease) ([place])   . Shortness of breath dyspnea   . Diabetes mellitus without complication ([place])   . Depression   . Anxiety   . Headache   . Hypomagnesemia 03/17/2015  . Urinary incontinence   . Asthma     Patient Active Problem List   Diagnosis Date Noted  . Acute on chronic respiratory failure ([place]) 04/26/2015  . Cancer ([place]) 03/25/2015  . Hypomagnesemia 03/17/2015  . Medication monitoring encounter 01/04/2015  . Hypoxemia 09/16/2014  . 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 Mass of lower lobe of right lung   . Lung mass 06/23/2014  . Restrictive lung disease 06/15/2014  . Pulmonary nodule 06/15/2014  .  Diabetes mellitus type 2, uncontrolled ([place]) 06/15/2014  . Benign hypertension 06/15/2014  . Hyperlipidemia 06/15/2014  . Morbid obesity ([place]) 06/15/2014  . Fatty liver 06/15/2014  . Mild asphyxia 06/15/2014  . Allergic rhinitis due to pollen 06/15/2014    Past Surgical History  Procedure Laterality Date  . Breast surgery Left cyst removed  . Cardiac catheterization    . Colonoscopy    . Endobronchial ultrasound Right 07/08/2014    Procedure: ENDOBRONCHIAL ULTRASOUND;  Surgeon: Vilinda Boehringer, MD;  Location: [facility] ORS;  Service: Cardiopulmonary;  Laterality: Right;    Past Surgical History  Procedure Laterality Date  . Breast surgery Left cyst removed  . Cardiac catheterization    . Colonoscopy    . Endobronchial ultrasound Right 07/08/2014    Procedure: ENDOBRONCHIAL ULTRASOUND;  Surgeon: Vilinda Boehringer, MD;  Location: [facility] ORS;  Service: Cardiopulmonary;  Laterality: Right;    Current Outpatient Rx  Name  Route  Sig  Dispense  Refill  . doxycycline (VIBRAMYCIN) 100 MG capsule   Oral   Take 100 mg by mouth 2 (two) times daily.         Marland Kitchen glimepiride (AMARYL) 4 MG tablet   Oral   Take 4 mg by mouth daily with breakfast.         . ibuprofen (ADVIL,MOTRIN) 200 MG tablet   Oral   Take 400 mg by mouth every 6 (six) hours as needed for fever or mild pain.         Marland Kitchen  ipratropium-albuterol (DUONEB) 0.5-2.5 (3) MG/3ML SOLN   Nebulization   Take 3 mLs by nebulization every 2 (two) hours as needed (for wheezing/shortness of breath).         . metFORMIN (GLUCOPHAGE-XR) 500 MG 24 hr tablet   Oral   Take 1,000 mg by mouth 2 (two) times daily with a meal.         . Morphine Sulfate (MORPHINE CONCENTRATE) 10 MG/0.5ML SOLN concentrated solution   Oral   Take 0.25 mLs (5 mg total) by mouth every hour as needed for moderate pain, severe pain or shortness of breath.   30 mL   0   . Multiple Vitamin (MULTIVITAMIN WITH MINERALS) TABS tablet   Oral   Take 1 tablet by mouth  daily.         Marland Kitchen levofloxacin (LEVAQUIN) 500 MG tablet   Oral   Take 1 tablet (500 mg total) by mouth daily. Patient not taking: Reported on 05/24/2015   6 tablet   0     Allergies:  Byetta 10 mcg pen; Hctz; Plavix; and Penicillins  Family History: Family History  Problem Relation Age of Onset  . Heart disease Mother   . Hypertension Mother   . Congestive Heart Failure Mother   . Heart disease Father   . Stroke Father   . Cancer Father     colon  . Cancer Maternal Uncle     lung  . Lung disease Maternal Grandmother   . Alcohol abuse Maternal Grandfather   . Cancer Paternal Grandmother   . Stroke Paternal Grandmother     Social History: Social History  Substance Use Topics  . Smoking status: Former Smoker -- 2.00 packs/day    Types: Cigarettes    Quit date: 06/14/2004  . Smokeless tobacco: Never Used  . Alcohol Use: No     Review of Systems:   10 point review of systems was performed and was otherwise negative: Review of systems is acquired from the patient's sign of a daily caretaker Constitutional: No fever Eyes: No visual disturbances ENT: No sore throat, ear pain Cardiac: No chest pain Respiratory: Increasing shortness of breath mentioned above Abdomen: No abdominal pain, no vomiting, No diarrhea Endocrine: No weight loss, No night sweats Extremities: No peripheral edema, cyanosis Skin: No rashes, easy bruising Neurologic: No focal weakness, trouble with speech or swollowing Urologic: No dysuria, Hematuria, or urinary frequency   Physical Exam:  ED Triage Vitals  Enc Vitals Group     BP 05/09/2015 1545 78/50 mmHg     Pulse Rate 05/07/2015 1545 78     Resp 05/12/2015 1545 16     Temp 05/21/2015 1600 99 F (37.2 C)     Temp Source 05/11/2015 1600 Axillary     SpO2 05/12/2015 1545 94 %     Weight --      Height --      Head Cir --      Peak Flow --      Pain Score --      Pain Loc --      Pain Edu? --      Excl. in [place]? --     General: Awake ,  Alert , and Oriented times1, patient's arousable. She currently appears to be in some respiratory distress and is on a nonrebreather.  Head: Normal cephalic , atraumatic Eyes: Pupils equal , round, reactive to light Nose/Throat: No nasal drainage, patent upper airway without erythema or exudate.  Neck: Supple, Full range  of motion, No anterior adenopathy or palpable thyroid masses Lungs: Limited to no air movementover the left lung fields. Trachea is midline  Heart: Tachycardia, regular rhythm without murmurs , gallops , or rubs Abdomen: Soft, non tender without rebound, guarding , or rigidity; bowel sounds positive and symmetric in all 4 quadrants. No organomegaly .        Extremities: 2 plus symmetric pulses. No edema, clubbing or cyanosis Neurologic: normal ambulation, Motor symmetric without deficits, sensory intact Skin: warm, dry, no rashes   Labs:   All laboratory work was reviewed including any pertinent negatives or positives listed below:  Labs Reviewed  CBC WITH DIFFERENTIAL/PLATELET - Abnormal; Notable for the following:    WBC 12.2 (*)    RBC 3.50 (*)    Hemoglobin 8.9 (*)    HCT 28.4 (*)    MCH 25.5 (*)    MCHC 31.5 (*)    RDW 18.1 (*)    Neutro Abs 11.4 (*)    Lymphs Abs 0.2 (*)    All other components within normal limits  COMPREHENSIVE METABOLIC PANEL - Abnormal; Notable for the following:    Potassium 5.3 (*)    Chloride 89 (*)    CO2 43 (*)    Glucose, Bld 121 (*)    BUN 71 (*)    Creatinine, Ser 2.60 (*)    Calcium >15.0 (*)    Total Protein 5.4 (*)    Albumin 2.1 (*)    AST 662 (*)    ALT 515 (*)    GFR calc non Af Amer 17 (*)    GFR calc Af Amer 20 (*)    All other components within normal limits  URINALYSIS COMPLETEWITH MICROSCOPIC ([facility] ONLY) - Abnormal; Notable for the following:    Color, Urine YELLOW (*)    APPearance CLOUDY (*)    Hgb urine dipstick 1+ (*)    Protein, ur 30 (*)    Leukocytes, UA 3+ (*)    Bacteria, UA MANY (*)     Squamous Epithelial / LPF 6-30 (*)    All other components within normal limits  PROTIME-INR - Abnormal; Notable for the following:    Prothrombin Time 15.8 (*)    All other components within normal limits  BLOOD GAS, ARTERIAL - Abnormal; Notable for the following:    pH, Arterial 7.33 (*)    pCO2 arterial 87 (*)    pO2, Arterial 57 (*)    Bicarbonate 45.9 (*)    Acid-Base Excess 16.8 (*)    Allens test (pass/fail) POSITIVE (*)    All other components within normal limits  URINE CULTURE  Patient appears to be CO2 retaining and was noted to have many bacteria in her urine. LFTs are elevated which made the secondary to metastatic disease. Patient has significant elevated BUN and creatinine.   Radiology:     [facility] Chest Port 1 View (Final result) Result time: 05/03/2015 16:57:51   Final result by Rad Results In Interface (05/10/2015 16:57:51)   Narrative:   CLINICAL DATA: Shortness of breath.  EXAM: PORTABLE CHEST 1 VIEW  COMPARISON: April 26, 2015.  FINDINGS: There is now noted complete opacification of the left hemithorax most consistent with pleural effusion. No pneumothorax is noted. Mildly increased interstitial densities are noted throughout the right lung concerning for edema. Bony thorax is unremarkable.  IMPRESSION: Mildly increased interstitial densities throughout right lung concerning for edema. Complete opacification of the left hemithorax is noted consistent with pleural effusion with probable underlying  atelectasis or infiltrate.   Electronically Signed By: Marijo Conception, M.D. On: 05/10/2015 16:57            I personally reviewed the radiologic studies    Critical Care:  CRITICAL CARE Performed by: Daymon Larsen   Total critical care time: 45 minutes  Critical care time was exclusive of separately billable procedures and treating other patients.  Critical care was necessary to treat or prevent imminent or life-threatening  deterioration.  Critical care was time spent personally by me on the following activities: development of treatment plan with patient and/or surrogate as well as nursing, discussions with consultants, evaluation of patient's response to treatment, examination of patient, obtaining history from patient or surrogate, ordering and performing treatments and interventions, ordering and review of laboratory studies, ordering and review of radiographic studies, pulse oximetry and re-evaluation of patient's condition. Hypotension resuscitation   ED Course: * Patient was hypotensive and was initiated on IV fluid boluses. This may be prerenal versus sepsis difficult to tell at this point. Patient's currently afebrile. Patient was found to have numerous laboratory abnormalities include a critically high calcium which is treated generally with IV fluids and diuretics. We cannot give the patient diuretics secondary to the hypotension. Patient was given repetitive 1 L normal saline boluses and that was started on Levophed gtt. Patient's chest x-ray shows basic white out with fluid noted some unknown whether or not this is pleural effusion for metastatic disease versus postobstructive pneumonia, etc. In consultation with the critical care team and the intensivist. Patient will be moved to the intensive care unit to continue with resuscitation. Patient will likely require central access and a PICC line was ordered.  Assessment: * Hypotension Metastatic lung cancer Renal failure Hypercalcemia Hypoxia   Final Clinical Impression: * Final diagnoses:  Diffuse abdominal pain     Plan:  ICU management           Daymon Larsen, MD 05/03/2015 2009

## 2015-05-18 NOTE — ED Notes (Signed)
Ems from home for per ems not acting right. Per ems son called them and wanted mom to have chest xray, blood work, and to check sore on back. Pt on 15 lNRB sats 88-90%. GCS e3,m4,v1. bp 78/50.

## 2015-05-18 NOTE — Progress Notes (Signed)
Pt removed from Bipap and placed on 6L [place] with humidity

## 2015-05-18 NOTE — Progress Notes (Signed)
Patient transferred from ER , on Bipap, pt is unable to make decisions for her self at this time. Son at bedside, states he wants the Bipap off and 02 replaced, but that he wants everything done for Mother. Dr Rosita Fire paged and talked to son , per MD pt is to remain a DNR, to stop bipap and place on humified 02 to maintain sats 88-93%. Levophed infusing at 50mgs, MD notified of MAP of 48,  MD order to increase levophed to 40 mcgs only.

## 2015-05-18 NOTE — ED Provider Notes (Signed)
Time Seen: Approximately 8768 I have reviewed the triage notes  Chief Complaint: Shortness of Breath   History of Present Illness: Sherry Mueller is a 75 y.o. female who was transported here by EMS. Patient has a history of metastatic lung cancer. Patient per record is a DO NOT RESUSCITATE but the patient's currently was later joined here by her son who wishes to have everything performed. Patient has been cared for home hospice by her son. He is the healthcare power of attorney. He's noticed some increase in shortness of breath and altered mental status. Patient herself is very lethargic and unable to offer any history or review of systems. She sound here to be very hypotensive and he states that she hasn't been able to eat much and had complained of some abdominal pain yesterday. There's been no complaints of chest pain or focal weakness.   Past Medical History  Diagnosis Date  . Stroke ([place])   . Hyperlipidemia   . Lung cancer ([place])   . Hypertension   . COPD (chronic obstructive pulmonary disease) ([place])   . Shortness of breath dyspnea   . Diabetes mellitus without complication ([place])   . Depression   . Anxiety   . Headache   . Hypomagnesemia 03/17/2015  . Urinary incontinence   . Asthma     Patient Active Problem List   Diagnosis Date Noted  . Acute on chronic respiratory failure ([place]) 04/26/2015  . Cancer ([place]) 03/25/2015  . Hypomagnesemia 03/17/2015  . Medication monitoring encounter 01/04/2015  . Hypoxemia 09/16/2014  . 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 Mass of lower lobe of right lung   . Lung mass 06/23/2014  . Restrictive lung disease 06/15/2014  . Pulmonary nodule 06/15/2014  . Diabetes mellitus type 2, uncontrolled ([place]) 06/15/2014  . Benign hypertension 06/15/2014  . Hyperlipidemia 06/15/2014  . Morbid obesity ([place]) 06/15/2014  . Fatty liver 06/15/2014  . Mild asphyxia 06/15/2014  . Allergic rhinitis due to pollen 06/15/2014    Past Surgical  History  Procedure Laterality Date  . Breast surgery Left cyst removed  . Cardiac catheterization    . Colonoscopy    . Endobronchial ultrasound Right 07/08/2014    Procedure: ENDOBRONCHIAL ULTRASOUND;  Surgeon: Vilinda Boehringer, MD;  Location: [facility] ORS;  Service: Cardiopulmonary;  Laterality: Right;    Past Surgical History  Procedure Laterality Date  . Breast surgery Left cyst removed  . Cardiac catheterization    . Colonoscopy    . Endobronchial ultrasound Right 07/08/2014    Procedure: ENDOBRONCHIAL ULTRASOUND;  Surgeon: Vilinda Boehringer, MD;  Location: [facility] ORS;  Service: Cardiopulmonary;  Laterality: Right;    No current outpatient prescriptions on file.  Allergies:  Byetta 10 mcg pen; Hctz; Plavix; and Penicillins  Family History: Family History  Problem Relation Age of Onset  . Heart disease Mother   . Hypertension Mother   . Congestive Heart Failure Mother   . Heart disease Father   . Stroke Father   . Cancer Father     colon  . Cancer Maternal Uncle     lung  . Lung disease Maternal Grandmother   . Alcohol abuse Maternal Grandfather   . Cancer Paternal Grandmother   . Stroke Paternal Grandmother     Social History: Social History  Substance Use Topics  . Smoking status: Former Smoker -- 2.00 packs/day    Types: Cigarettes    Quit date: 06/14/2004  . Smokeless tobacco: Never Used  . Alcohol Use: No  Review of Systems:   10 point review of systems was performed and was otherwise negative: Review of systems is taken through the patient's son Constitutional: No fever Eyes: No visual disturbances ENT: No sore throat, ear pain Cardiac: No chest pain Respiratory: Patient is on chronic supplemental oxygen therapy and her sats are normally 90% according to her son  wheezing, or stridor Abdomen: No abdominal pain, no vomiting, No diarrhea Endocrine: No weight loss, No night sweats Extremities: No new peripheral edema, cyanosis Skin: No rashes, easy  bruising Neurologic: No focal weakness, trouble with speech or swollowing Urologic: No dysuria, Hematuria, or urinary frequency   Physical Exam:  ED Triage Vitals  Enc Vitals Group     BP 05/25/2015 1545 78/50 mmHg     Pulse Rate 05/30/2015 1545 78     Resp 05/25/2015 1545 16     Temp 05/09/2015 1600 99 F (37.2 C)     Temp Source 05/04/2015 1600 Axillary     SpO2 05/31/2015 1545 94 %     Weight --      Height --      Head Cir --      Peak Flow --      Pain Score --      Pain Loc --      Pain Edu? --      Excl. in [place]? --     General: Awake , Alert , and Oriented times1 patient's lethargic Head: Normal cephalic , atraumatic Eyes: Pupils equal , round, reactive to light Nose/Throat: No nasal drainage, patent upper airway without erythema or exudate.  Neck: Supple, Full range of motion, No anterior adenopathy or palpable thyroid masses Lungs: Diminished breath sounds throughout the left lung fields  Heart: Regular rate, regular rhythm without murmurs , gallops , or rubs Abdomen: Soft, non tender without rebound, guarding , or rigidity; bowel sounds positive and symmetric in all 4 quadrants. No organomegaly .        Extremities: 2 plus symmetric pulses. No edema, clubbing or cyanosis Neurologic: normal ambulation, Motor symmetric without deficits, sensory intact Skin: warm, dry, no rashes   Labs:   All laboratory work was reviewed including any pertinent negatives or positives listed below:  Labs Reviewed  CBC WITH DIFFERENTIAL/PLATELET - Abnormal; Notable for the following:    WBC 12.2 (*)    RBC 3.50 (*)    Hemoglobin 8.9 (*)    HCT 28.4 (*)    MCH 25.5 (*)    MCHC 31.5 (*)    RDW 18.1 (*)    Neutro Abs 11.4 (*)    Lymphs Abs 0.2 (*)    All other components within normal limits  COMPREHENSIVE METABOLIC PANEL - Abnormal; Notable for the following:    Potassium 5.3 (*)    Chloride 89 (*)    CO2 43 (*)    Glucose, Bld 121 (*)    BUN 71 (*)    Creatinine, Ser 2.60 (*)     Calcium >15.0 (*)    Total Protein 5.4 (*)    Albumin 2.1 (*)    AST 662 (*)    ALT 515 (*)    GFR calc non Af Amer 17 (*)    GFR calc Af Amer 20 (*)    All other components within normal limits  URINALYSIS COMPLETEWITH MICROSCOPIC ([facility] ONLY) - Abnormal; Notable for the following:    Color, Urine YELLOW (*)    APPearance CLOUDY (*)    Hgb urine dipstick 1+ (*)  Protein, ur 30 (*)    Leukocytes, UA 3+ (*)    Bacteria, UA MANY (*)    Squamous Epithelial / LPF 6-30 (*)    All other components within normal limits  PROTIME-INR - Abnormal; Notable for the following:    Prothrombin Time 15.8 (*)    All other components within normal limits  BLOOD GAS, ARTERIAL - Abnormal; Notable for the following:    pH, Arterial 7.33 (*)    pCO2 arterial 87 (*)    pO2, Arterial 57 (*)    Bicarbonate 45.9 (*)    Acid-Base Excess 16.8 (*)    [place]ens test (pass/fail) POSITIVE (*)    All other components within normal limits  URINE CULTURE  Reviewed the patient's laboratory work shows numerous significant abnormalities including elevated calcium Renal insufficiency      DG Chest Port 1 View (Final result) Result time: 05/17/2015 16:57:51   Final result by Rad Results In Interface (06/01/2015 16:57:51)   Narrative:   CLINICAL DATA: Shortness of breath.  EXAM: PORTABLE CHEST 1 VIEW  COMPARISON: April 26, 2015.  FINDINGS: There is now noted complete opacification of the left hemithorax most consistent with pleural effusion. No pneumothorax is noted. Mildly increased interstitial densities are noted throughout the right lung concerning for edema. Bony thorax is unremarkable.  IMPRESSION: Mildly increased interstitial densities throughout right lung concerning for edema. Complete opacification of the left hemithorax is noted consistent with pleural effusion with probable underlying atelectasis or infiltrate.   Electronically Signed By: Marijo Conception, M.D. On: 05/17/2015  16:57       Radiology: *     I personally reviewed the radiologic studies      Critical Care CRITICAL CARE Performed by: Daymon Larsen   Total critical care time: 45 minutes  Critical care time was exclusive of separately billable procedures and treating other patients.  Critical care was necessary to treat or prevent imminent or life-threatening deterioration.  Critical care was time spent personally by me on the following activities: development of treatment plan with patient and/or surrogate as well as nursing, discussions with consultants, evaluation of patient's response to treatment, examination of patient, obtaining history from patient or surrogate, ordering and performing treatments and interventions, ordering and review of laboratory studies, ordering and review of radiographic studies, pulse oximetry and re-evaluation of patient's condition.  Workup and evaluation and treatment for her persistent hypotension   ED Course: * Patient's stay here showed little improvement. Patient was started on IV fluid boluses for suspected sepsis along with hypotension. Patient does afebrile and her laboratory work points more toward renal insufficiency with metastatic lung cancer. She has basic white count left side of her chest x-ray. Normal liver findings were reviewed with her son and we discussed further aggressive airway management etc. She was placed on a BiPAP mask for her elevated CO2 to try to take a step before intubation. The patient had time to case was reviewed with the intensivist. She received the IV fluids and still had hypotension and was then started on a Levophed gtt.     Assessment:  Metastatic lung cancer Large left-sided pleural effusion Renal insufficiency Persistent hypotension Hypoxia Altered mental status      Plan:  Patient be admitted to the intensive care unit            Daymon Larsen, MD 05/31/2015 2115

## 2015-05-18 NOTE — H&P (Signed)
PULMONARY / CRITICAL CARE MEDICINE   Name: Sherry Mueller MRN: [id number] DOB: 10/20/1940    ADMISSION DATE:  05/26/2015  PT PROFILE:   38 F with very advanced metastatic lung cancer admitted with AMS, acute on chronic hypercarbic respiratory failure, severe hypercalcemia, white out of L hemithorax and pyuria  HISTORY OF PRESENT ILLNESS:   39 F with metastatic squamous ca first diagnosed in summer of 2016 which has progressed despite first and second line chemotherapy. She was hospitalized in April of this year and was seen by Palliative Care at which time she refused transfer to ICU and BiPAP. She was made DNR @ that time and was discharged home with Hospice. She was brought to ED by her son who reported that she has had urinary frequency for 24-48 hours PTA and on the evening prior to admission, she complained of severe lower abdominal pain. On the day of admission, he found her with depressed LOC. In the ED, she was found to be hypercarbic and placed on BiPAP. Her CXR revealed complete white out of the L hemithorax. She was hypotensive and started on NE. Her urine revealed TNTC WBCs   PAST MEDICAL HISTORY :  She  has a past medical history of Stroke ([place]); Hyperlipidemia; Lung cancer ([place]); Hypertension; COPD (chronic obstructive pulmonary disease) ([place]); Shortness of breath dyspnea; Diabetes mellitus without complication ([place]); Depression; Anxiety; Headache; Hypomagnesemia (03/17/2015); Urinary incontinence; and Asthma.  PAST SURGICAL HISTORY: She  has past surgical history that includes Breast surgery (Left, cyst removed); Cardiac catheterization; Colonoscopy; and Endobronchial ultrasound (Right, 07/08/2014).  Allergies  Allergen Reactions  . Byetta 10 Mcg Pen [Exenatide] Nausea And Vomiting  . Hctz [Hydrochlorothiazide] Nausea And Vomiting  . Plavix [Clopidogrel] Nausea And Vomiting  . Penicillins Itching, Rash and Other (See Comments)    Has patient had a PCN reaction causing immediate  rash, facial/tongue/throat swelling, SOB or lightheadedness with hypotension: No Has patient had a PCN reaction causing severe rash involving mucus membranes or skin necrosis: No Has patient had a PCN reaction that required hospitalization No Has patient had a PCN reaction occurring within the last 10 years: No If all of the above answers are "NO", then may proceed with Cephalosporin use.    Current Facility-Administered Medications on File Prior to Encounter  Medication  . 0.9 %  sodium chloride infusion   Current Outpatient Prescriptions on File Prior to Encounter  Medication Sig  . Morphine Sulfate (MORPHINE CONCENTRATE) 10 MG/0.5ML SOLN concentrated solution Take 0.25 mLs (5 mg total) by mouth every hour as needed for moderate pain, severe pain or shortness of breath.  . levofloxacin (LEVAQUIN) 500 MG tablet Take 1 tablet (500 mg total) by mouth daily. (Patient not taking: Reported on 05/07/2015)    FAMILY HISTORY:  Her indicated that her mother is deceased. She indicated that her father is deceased.   SOCIAL HISTORY: She  reports that she quit smoking about 10 years ago. Her smoking use included Cigarettes. She smoked 2.00 packs per day. She has never used smokeless tobacco. She reports that she does not drink alcohol or use illicit drugs.  REVIEW OF SYSTEMS:   Level 5 caveat  SUBJECTIVE:    VITAL SIGNS: BP 95/33 mmHg  Pulse 75  Temp(Src) 97.9 F (36.6 C) (Axillary)  Resp 24  SpO2 88%   INTAKE / OUTPUT:    PHYSICAL EXAMINATION: General: obese, RASS -4, on BiPAP Neuro: PERRL, EOMI, MAEs HEENT: bruising around R eye Cardiovascular: reg, no M Lungs: no wheezes,  BS absent on L Abdomen: obese, soft, diminished BS Ext: Bruising on R shoulder, no edema  LABS:  BMET  Recent Labs Lab 05/22/2015 1637  NA 137  K 5.3*  CL 89*  CO2 43*  BUN 71*  CREATININE 2.60*  GLUCOSE 121*    Electrolytes  Recent Labs Lab 05/02/2015 1637  CALCIUM >15.0*    CBC  Recent  Labs Lab 05/20/2015 1637  WBC 12.2*  HGB 8.9*  HCT 28.4*  PLT 251    Coag's  Recent Labs Lab 05/31/2015 1637  INR 1.24    Sepsis Markers No results for input(s): LATICACIDVEN, PROCALCITON, O2SATVEN in the last 168 hours.  ABG  Recent Labs Lab 05/17/2015 1721  PHART 7.33*  PCO2ART 87*  PO2ART 57*    Liver Enzymes  Recent Labs Lab 05/23/2015 1637  AST 662*  ALT 515*  ALKPHOS 75  BILITOT 0.7  ALBUMIN 2.1*    Cardiac Enzymes No results for input(s): TROPONINI, PROBNP in the last 168 hours.  Glucose No results for input(s): GLUCAP in the last 168 hours.  Imaging Dg Chest Port 1 View  05/17/2015  CLINICAL DATA:  Shortness of breath. EXAM: PORTABLE CHEST 1 VIEW COMPARISON:  April 26, 2015. FINDINGS: There is now noted complete opacification of the left hemithorax most consistent with pleural effusion. No pneumothorax is noted. Mildly increased interstitial densities are noted throughout the right lung concerning for edema. Bony thorax is unremarkable. IMPRESSION: Mildly increased interstitial densities throughout right lung concerning for edema. Complete opacification of the left hemithorax is noted consistent with pleural effusion with probable underlying atelectasis or infiltrate. Electronically Signed   By: Marijo Conception, M.D.   On: 05/31/2015 16:57     ASSESSMENT / PLAN:  PULMONARY A: Acute on chronic hypercarbic/hypoxic respiratory failure P:   Supplemental O2 to maintain SpO2 88-93% Pt's son wishes to stop BiPAP DNI - see discussion below  CARDIOVASCULAR A:  Shock, likely septic and hypovolemic P:  Volume resuscitate Continue norepinephrine - see below  RENAL A:   AKI Mild hyperkalemia Severe hypercalcemia due to cancer P:   Pamidronate X 1 dose NS infusion  GASTROINTESTINAL A:   Abdominal pain - cystitis vs hypercalcemia related Chronic PPI use P:   SUP: IV PPI NPO for now  HEMATOLOGIC A:   Chronic anemia P:  DVT px: SCDs Not a  candidate for transfusion  INFECTIOUS A:   Pyuria Suspected severe sepsis P:   Cipro per pharmacy  ENDOCRINE A:   DM2 - little benefit to glycemic control P:   Will not monitor or treat  NEUROLOGIC A:   Acute encephalopathy due to hypercarbia and hypercalcemia Pain - pt's son expresses strong desire that we avoid morphine P:   RASS goal: 0 Scheduled IV acetaminophen Low dose PRN fentanyl   FAMILY  I spent approx 30 mins discussing goals of care with the patient's son, Sherry Mueller in person and her daughter, Sherry Mueller over the phone. We acknowledged the patient's end stage illness and discussed her current critical condition. We reviewed her previously defined wishes which include a desire to forgo aggressive measures such as ACLS and intubation. She was adamant about a desire to die at home. With these goals defined we established a plan to support her over night with vasopressors, antibiotics, analgesics and oxygen. Her son expressed his desire that we not use BiPAP as this has caused discomfort in the past. Our hope is to establish a plan that will allow her to go home  on the day following this admission. I made it clear that we might not be able to achieve this  CCM time: 50 mins The above time includes time spent in consultation with patient and/or family members and reviewing care plan on multidisciplinary rounds  Merton Border, MD PCCM service Mobile [phone] Pager [phone]     05/05/2015, 9:10 PM

## 2015-05-20 LAB — URINE CULTURE

## 2015-06-02 NOTE — Progress Notes (Signed)
69 Son in to see mom. Dr. Alva Garnet in to talk with son. Dying heart noted on monitor and low O2 sats.

## 2015-06-02 NOTE — Progress Notes (Signed)
Pt on 02 via nasal cannula, sats 88-91 on 5 L 02, BP Map 40- 55 on 33mgs of Levophed. 2 peripheral IVs in place, foley cath in place, 5546m output, pt remains responsive to painful stimuli only, mouth care given, minimally repositioned as desats if moved off left side down. Son went home to sleep and rest

## 2015-06-02 NOTE — Patient Care Conference (Signed)
1100 Post mortem care done.

## 2015-06-02 NOTE — Progress Notes (Signed)
Pt sats started decreasing o2 increased to 6 L via [place], then sats into the 70s. Son called and informed on condition change, and asked for mother to be placed on 02 mask, not bipap mask . Non breather mask applied at 15 L , increase work of breathing noted

## 2015-06-02 NOTE — Discharge Summary (Signed)
DEATH SUMMARY  DATE OF ADMISSION:  05-20-15  DATE OF DISCHARGE/DEATH: 21-May-2015   ADMISSION DIAGNOSES:   Stage 4 lung cancer Acute on chronic hypercarbic/hypoxic respiratory failure Shock, septic and hypovolemic AKI Mild hyperkalemia Severe hypercalcemia due to cancer Abdominal pain - cystitis vs hypercalcemia Chronic PPI use Chronic anemia Pyuria Suspected severe sepsis DM2 - little benefit to glycemic control Acute encephalopathy due to hypercarbia and hypercalcemia Pain    DISCHARGE DIAGNOSES:   Stage 4 lung cancer Acute on chronic hypercarbic/hypoxic respiratory failure Shock, septic and hypovolemic AKI Mild hyperkalemia Severe hypercalcemia due to cancer Abdominal pain - cystitis vs hypercalcemia Chronic PPI use Chronic anemia Pyuria Suspected severe sepsis DM2 - little benefit to glycemic control Acute encephalopathy due to hypercarbia and hypercalcemia Pain    PRESENTATION:   Pt was admitted with the following HPI and the above admission diagnoses:  HISTORY OF PRESENT ILLNESS:  60 F with metastatic squamous ca first diagnosed in summer of 2016 which has progressed despite first and second line chemotherapy. She was hospitalized in April of this year and was seen by Palliative Care at which time she refused transfer to ICU and BiPAP. She was made DNR @ that time and was discharged home with Hospice. She was brought to ED by her son who reported that she has had urinary frequency for 24-48 hours PTA and on the evening prior to admission, she complained of severe lower abdominal pain. On the day of admission, he found her with depressed LOC. In the ED, she was found to be hypercarbic and placed on BiPAP. Her CXR revealed complete white out of the L hemithorax. She was hypotensive and started on NE. Her urine revealed TNTC WBCs   HOSPITAL COURSE:   On admission, goals of care were discussed in detail with the pt's son and daughter. The following documentation  describes that communication:  I spent approx 30 mins discussing goals of care with the patient's son, Annie Main in person and her daughter, Collie Siad over the phone. We acknowledged the patient's end stage illness and discussed her current critical condition. We reviewed her previously defined wishes which include a desire to forgo aggressive measures such as ACLS and intubation. She was adamant about a desire to die at home. With these goals defined we established a plan to support her over night with vasopressors, antibiotics, analgesics and oxygen. Her son expressed his desire that we not use BiPAP as this has caused discomfort in the past. Our hope is to establish a plan that will allow her to go home on the day following this admission. I made it clear that we might not be able to achieve this   Cause of death:  Squamous cell carcinoma of the lung  Contributing factors: Shock, hypercalcemia, AKI, DM 2  Autopsy: No  Smoking: Yes    Merton Border, MD PCCM service Mobile [phone] Pager [phone] 2015/05/21

## 2015-06-02 NOTE — Significant Event (Signed)
Asystole on monitor. No respiratory effort. HS absent. All reflexes absent. Time of death @ [place], MD PCCM service Mobile [phone] Pager [phone] 31-May-2015

## 2015-06-02 NOTE — Final Progress Note (Signed)
1140 Body sent to morgue.

## 2015-06-02 NOTE — Progress Notes (Signed)
5015 Patient expired. Son with her when she died. Son very upset. Chaplain called.

## 2015-06-02 DEATH — deceased

## 2015-06-20 ENCOUNTER — Other Ambulatory Visit: Payer: Self-pay | Admitting: Nurse Practitioner

## 2018-01-13 IMAGING — CT NM PET TUM IMG RESTAG (PS) SKULL BASE T - THIGH
1 of 10 series · 1 of 25 positions shown · non-contrast
Comparison: Chest CT 11/10/2014.  PET-CT 06/29/2014.

CLINICAL DATA: Subsequent treatment strategy for squamous cell
carcinoma of the right lung. Last chemotherapy and radiation therapy
completed 2 weeks ago. Followup study.

EXAM:
NUCLEAR MEDICINE PET SKULL BASE TO THIGH
TECHNIQUE: 12.7 MCi F-18 FDG was injected intravenously. Full-ring PET imaging
was performed from the skull base to thigh after the radiotracer. CT
data was obtained and used for attenuation correction and anatomic
localization.
FASTING BLOOD GLUCOSE:  Value: 142 mg/dl

[Series 3: ct wb 5.0 b30f · axial · 5.0mm · 0.98mm/px · 1 of 329 slices shown]
[im 329/329  brain]
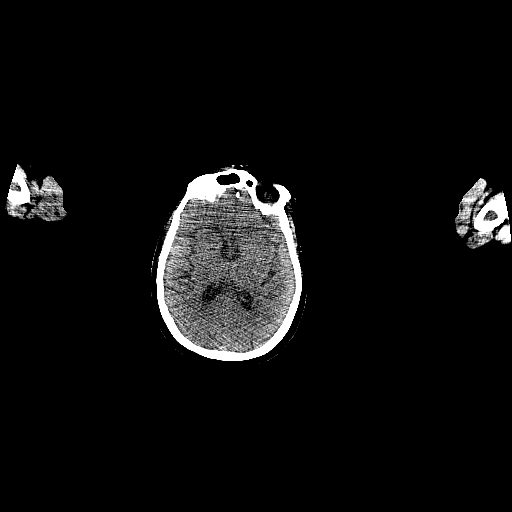

[1 of 25 positions shown; findings below may reference images not displayed]

FINDINGS: NECK

1.5 cm short axis right supraclavicular lymph node (image 54 of
series 3) is hypermetabolic (SUVmax = 7.6).

CHEST

Previously noted left lower lobe mass is slightly smaller on today's
examination, currently measuring 5.7 x 3.8 cm (image 106 of series
3), and is markedly hypermetabolic (SUVmax = 20.1). Extensive soft
tissue fullness in the left hilar region is hypermetabolic (SUVmax =
19.6). Previously treated right lower lobe mass is now a small right
perihilar nodule in the superior segment of the right lower lobe
(image 99 of series 3) measuring 2.6 x 1.7 cm (SUVmax = 10.2).
Several other small foci of hypermetabolism are noted in the
mediastinal nodal stations, with lymph nodes measuring up to 1.4 cm
in short axis in the low left paratracheal nodal station (SUVmax =
4.0). Heart size is normal. There is no significant pericardial
fluid, thickening or pericardial calcification. There is
atherosclerosis of the thoracic aorta, the great vessels of the
mediastinum and the coronary arteries, including calcified
atherosclerotic plaque in the left anterior descending, left
circumflex and right coronary arteries. Esophagus is unremarkable in
appearance. No axillary lymphadenopathy.

ABDOMEN/PELVIS

No abnormal hypermetabolic activity within the liver, pancreas,
adrenal glands, or spleen. No hypermetabolic lymph nodes in the
abdomen or pelvis. Atherosclerosis throughout the abdominal and
pelvic vasculature, and in the mid. Numerous colonic diverticulae
are noted, particularly in the descending colon and sigmoid colon,
without surrounding inflammatory changes to suggest an acute
diverticulitis at this time. Normal appendix. No significant volume
of ascites. No pneumoperitoneum.

SKELETON

Small foci of hypermetabolism (SUVmax = 5.2) the left side of the
T11 vertebral body without any corresponding osseous abnormality,
and small focus of hypermetabolism (SUVmax = 5.8) in the superior
endplate of L1, which appears to correspond to a Schmorl's node. No
other hypermetabolic aggressive appearing lytic or blastic lesions
are noted in the visualized portions of the skeleton.
IMPRESSION: 1. Today's study again demonstrates a large hypermetabolic left
lower lobe mass and small hypermetabolic nodule in the central
aspect of the superior segment of the right lower lobe. This is
associated with left hilar and bilateral mediastinal
lymphadenopathy, as well as a malignant right supraclavicular lymph
node. No other definite metastatic disease noted elsewhere in the
abdomen or pelvis.
2. Small area of hypermetabolism in the left side of the T11
vertebral body, without definite corresponding osseous abnormality.
Close attention on followup studies is recommended to exclude the
possibility of metastatic disease to the bones. Small hypermetabolic
area in superior endplate of L1 corresponds to a Schmorl's node.
3. Colonic diverticulosis without evidence of acute diverticulitis
at this time.
4. Additional incidental findings, as above.
